# Patient Record
Sex: Male | Born: 1935 | Race: White | Hispanic: No | Marital: Married | Smoking: Former smoker
Health system: Southern US, Community
[De-identification: ages and names within clinical notes are randomized; demographics above are authoritative.]

## PROBLEM LIST (undated history)

## (undated) DIAGNOSIS — E785 Hyperlipidemia, unspecified: Secondary | ICD-10-CM

## (undated) DIAGNOSIS — E039 Hypothyroidism, unspecified: Secondary | ICD-10-CM

## (undated) DIAGNOSIS — Z9289 Personal history of other medical treatment: Secondary | ICD-10-CM

## (undated) DIAGNOSIS — M1990 Unspecified osteoarthritis, unspecified site: Secondary | ICD-10-CM

## (undated) DIAGNOSIS — J449 Chronic obstructive pulmonary disease, unspecified: Secondary | ICD-10-CM

## (undated) DIAGNOSIS — C4492 Squamous cell carcinoma of skin, unspecified: Secondary | ICD-10-CM

## (undated) DIAGNOSIS — Z8546 Personal history of malignant neoplasm of prostate: Secondary | ICD-10-CM

## (undated) DIAGNOSIS — L309 Dermatitis, unspecified: Secondary | ICD-10-CM

## (undated) DIAGNOSIS — R739 Hyperglycemia, unspecified: Secondary | ICD-10-CM

## (undated) DIAGNOSIS — D126 Benign neoplasm of colon, unspecified: Secondary | ICD-10-CM

## (undated) DIAGNOSIS — K219 Gastro-esophageal reflux disease without esophagitis: Secondary | ICD-10-CM

## (undated) DIAGNOSIS — C61 Malignant neoplasm of prostate: Secondary | ICD-10-CM

## (undated) DIAGNOSIS — I2510 Atherosclerotic heart disease of native coronary artery without angina pectoris: Secondary | ICD-10-CM

## (undated) DIAGNOSIS — Z87442 Personal history of urinary calculi: Secondary | ICD-10-CM

## (undated) DIAGNOSIS — D649 Anemia, unspecified: Secondary | ICD-10-CM

## (undated) DIAGNOSIS — G43909 Migraine, unspecified, not intractable, without status migrainosus: Secondary | ICD-10-CM

## (undated) DIAGNOSIS — K449 Diaphragmatic hernia without obstruction or gangrene: Secondary | ICD-10-CM

## (undated) DIAGNOSIS — K5730 Diverticulosis of large intestine without perforation or abscess without bleeding: Secondary | ICD-10-CM

## (undated) DIAGNOSIS — C3490 Malignant neoplasm of unspecified part of unspecified bronchus or lung: Secondary | ICD-10-CM

## (undated) HISTORY — DX: Personal history of other medical treatment: Z92.89

## (undated) HISTORY — PX: RETINAL DETACHMENT SURGERY: SHX105

## (undated) HISTORY — DX: Migraine, unspecified, not intractable, without status migrainosus: G43.909

## (undated) HISTORY — DX: Unspecified osteoarthritis, unspecified site: M19.90

## (undated) HISTORY — DX: Hyperglycemia, unspecified: R73.9

## (undated) HISTORY — PX: OTHER SURGICAL HISTORY: SHX169

## (undated) HISTORY — DX: Anemia, unspecified: D64.9

## (undated) HISTORY — DX: Benign neoplasm of colon, unspecified: D12.6

## (undated) HISTORY — PX: PROSTATECTOMY: SHX69

## (undated) HISTORY — PX: CORONARY STENT PLACEMENT: SHX1402

## (undated) HISTORY — DX: Malignant neoplasm of prostate: C61

## (undated) HISTORY — PX: TOTAL KNEE ARTHROPLASTY: SHX125

## (undated) HISTORY — DX: Hypothyroidism, unspecified: E03.9

## (undated) HISTORY — DX: Personal history of urinary calculi: Z87.442

## (undated) HISTORY — DX: Diaphragmatic hernia without obstruction or gangrene: K44.9

## (undated) HISTORY — PX: APPENDECTOMY: SHX54

## (undated) HISTORY — DX: Chronic obstructive pulmonary disease, unspecified: J44.9

## (undated) HISTORY — DX: Dermatitis, unspecified: L30.9

## (undated) HISTORY — DX: Gastro-esophageal reflux disease without esophagitis: K21.9

## (undated) HISTORY — DX: Diverticulosis of large intestine without perforation or abscess without bleeding: K57.30

## (undated) HISTORY — DX: Personal history of malignant neoplasm of prostate: Z85.46

## (undated) HISTORY — DX: Atherosclerotic heart disease of native coronary artery without angina pectoris: I25.10

## (undated) HISTORY — DX: Hyperlipidemia, unspecified: E78.5

---

## 1994-12-05 HISTORY — PX: LITHOTRIPSY: SUR834

## 1999-05-31 ENCOUNTER — Ambulatory Visit (HOSPITAL_BASED_OUTPATIENT_CLINIC_OR_DEPARTMENT_OTHER): Admission: RE | Admit: 1999-05-31 | Discharge: 1999-05-31 | Payer: Self-pay | Admitting: Orthopedic Surgery

## 2000-02-17 ENCOUNTER — Encounter: Payer: Self-pay | Admitting: Orthopedic Surgery

## 2000-02-23 ENCOUNTER — Encounter: Payer: Self-pay | Admitting: Orthopedic Surgery

## 2000-02-23 ENCOUNTER — Inpatient Hospital Stay (HOSPITAL_COMMUNITY): Admission: RE | Admit: 2000-02-23 | Discharge: 2000-02-25 | Payer: Self-pay | Admitting: Orthopedic Surgery

## 2000-02-25 ENCOUNTER — Inpatient Hospital Stay (HOSPITAL_COMMUNITY)
Admission: RE | Admit: 2000-02-25 | Discharge: 2000-03-02 | Payer: Self-pay | Admitting: Physical Medicine & Rehabilitation

## 2000-07-11 ENCOUNTER — Encounter (INDEPENDENT_AMBULATORY_CARE_PROVIDER_SITE_OTHER): Payer: Self-pay | Admitting: Specialist

## 2000-07-11 ENCOUNTER — Other Ambulatory Visit: Admission: RE | Admit: 2000-07-11 | Discharge: 2000-07-11 | Payer: Self-pay | Admitting: Gastroenterology

## 2000-12-29 ENCOUNTER — Inpatient Hospital Stay (HOSPITAL_COMMUNITY): Admission: RE | Admit: 2000-12-29 | Discharge: 2001-01-03 | Payer: Self-pay | Admitting: Orthopedic Surgery

## 2001-03-19 ENCOUNTER — Ambulatory Visit (HOSPITAL_BASED_OUTPATIENT_CLINIC_OR_DEPARTMENT_OTHER): Admission: RE | Admit: 2001-03-19 | Discharge: 2001-03-19 | Payer: Self-pay | Admitting: Orthopedic Surgery

## 2001-08-22 ENCOUNTER — Encounter: Admission: RE | Admit: 2001-08-22 | Discharge: 2001-08-22 | Payer: Self-pay | Admitting: *Deleted

## 2003-07-03 ENCOUNTER — Encounter: Payer: Self-pay | Admitting: *Deleted

## 2003-07-03 ENCOUNTER — Inpatient Hospital Stay (HOSPITAL_COMMUNITY): Admission: EM | Admit: 2003-07-03 | Discharge: 2003-07-04 | Payer: Self-pay | Admitting: *Deleted

## 2003-12-06 HISTORY — PX: COLONOSCOPY: SHX174

## 2004-04-08 ENCOUNTER — Encounter (INDEPENDENT_AMBULATORY_CARE_PROVIDER_SITE_OTHER): Payer: Self-pay | Admitting: Gastroenterology

## 2005-04-22 ENCOUNTER — Ambulatory Visit: Payer: Self-pay | Admitting: Cardiology

## 2005-04-27 ENCOUNTER — Ambulatory Visit: Payer: Self-pay

## 2005-05-18 ENCOUNTER — Ambulatory Visit: Payer: Self-pay | Admitting: Family Medicine

## 2005-05-25 ENCOUNTER — Encounter: Admission: RE | Admit: 2005-05-25 | Discharge: 2005-05-25 | Payer: Self-pay | Admitting: Family Medicine

## 2005-05-26 ENCOUNTER — Ambulatory Visit: Payer: Self-pay | Admitting: Family Medicine

## 2005-12-08 ENCOUNTER — Ambulatory Visit: Payer: Self-pay | Admitting: Cardiology

## 2005-12-08 ENCOUNTER — Inpatient Hospital Stay (HOSPITAL_COMMUNITY): Admission: EM | Admit: 2005-12-08 | Discharge: 2005-12-09 | Payer: Self-pay | Admitting: Emergency Medicine

## 2005-12-30 ENCOUNTER — Ambulatory Visit: Payer: Self-pay | Admitting: Cardiology

## 2006-01-10 ENCOUNTER — Encounter: Payer: Self-pay | Admitting: Internal Medicine

## 2006-01-10 ENCOUNTER — Ambulatory Visit: Payer: Self-pay

## 2006-04-26 ENCOUNTER — Ambulatory Visit: Payer: Self-pay | Admitting: Cardiology

## 2006-04-26 ENCOUNTER — Ambulatory Visit: Payer: Self-pay | Admitting: Gastroenterology

## 2006-11-24 ENCOUNTER — Ambulatory Visit: Payer: Self-pay | Admitting: Cardiology

## 2006-12-20 ENCOUNTER — Ambulatory Visit: Payer: Self-pay | Admitting: Cardiology

## 2007-03-08 ENCOUNTER — Ambulatory Visit: Payer: Self-pay | Admitting: Family Medicine

## 2007-06-20 DIAGNOSIS — I2510 Atherosclerotic heart disease of native coronary artery without angina pectoris: Secondary | ICD-10-CM

## 2007-06-20 DIAGNOSIS — Z8546 Personal history of malignant neoplasm of prostate: Secondary | ICD-10-CM | POA: Insufficient documentation

## 2007-06-20 DIAGNOSIS — K219 Gastro-esophageal reflux disease without esophagitis: Secondary | ICD-10-CM

## 2007-06-20 DIAGNOSIS — E785 Hyperlipidemia, unspecified: Secondary | ICD-10-CM

## 2007-06-26 ENCOUNTER — Ambulatory Visit: Payer: Self-pay | Admitting: Cardiology

## 2007-07-26 ENCOUNTER — Ambulatory Visit: Payer: Self-pay | Admitting: Cardiology

## 2007-09-27 ENCOUNTER — Encounter: Payer: Self-pay | Admitting: Family Medicine

## 2007-10-05 ENCOUNTER — Ambulatory Visit: Payer: Self-pay | Admitting: Gastroenterology

## 2007-10-22 ENCOUNTER — Telehealth: Payer: Self-pay | Admitting: Family Medicine

## 2007-11-06 ENCOUNTER — Ambulatory Visit: Payer: Self-pay | Admitting: Family Medicine

## 2007-11-06 DIAGNOSIS — M129 Arthropathy, unspecified: Secondary | ICD-10-CM | POA: Insufficient documentation

## 2007-11-06 DIAGNOSIS — L259 Unspecified contact dermatitis, unspecified cause: Secondary | ICD-10-CM

## 2007-11-07 ENCOUNTER — Ambulatory Visit: Payer: Self-pay | Admitting: Family Medicine

## 2007-11-09 ENCOUNTER — Encounter: Payer: Self-pay | Admitting: Family Medicine

## 2007-11-11 ENCOUNTER — Emergency Department (HOSPITAL_COMMUNITY): Admission: EM | Admit: 2007-11-11 | Discharge: 2007-11-11 | Payer: Self-pay | Admitting: Emergency Medicine

## 2007-11-13 ENCOUNTER — Telehealth: Payer: Self-pay | Admitting: Family Medicine

## 2007-11-30 LAB — CONVERTED CEMR LAB
BUN: 17 mg/dL (ref 6–23)
Basophils Absolute: 0 10*3/uL (ref 0.0–0.1)
Basophils Relative: 0.3 % (ref 0.0–1.0)
CO2: 32 meq/L (ref 19–32)
Calcium: 9.7 mg/dL (ref 8.4–10.5)
Cholesterol: 116 mg/dL (ref 0–200)
GFR calc Af Amer: 84 mL/min
HDL: 37.2 mg/dL — ABNORMAL LOW (ref >39.0)
Hemoglobin: 14.7 g/dL (ref 13.0–17.0)
LDL Cholesterol: 66 mg/dL (ref 0–99)
MCHC: 35.1 g/dL (ref 30.0–36.0)
Monocytes Absolute: 0.6 10*3/uL (ref 0.2–0.7)
Monocytes Relative: 11.2 % — ABNORMAL HIGH (ref 3.0–11.0)
Neutro Abs: 3.5 10*3/uL (ref 1.4–7.7)
Platelets: 169 10*3/uL (ref 150–400)
Potassium: 4.8 meq/L (ref 3.5–5.1)
RDW: 12.5 % (ref 11.5–14.6)
Triglycerides: 66 mg/dL (ref 0–149)
VLDL: 13 mg/dL (ref 0–40)

## 2007-12-03 LAB — CONVERTED CEMR LAB: Vit D, 1,25-Dihydroxy: 30 (ref 30–89)

## 2007-12-11 ENCOUNTER — Ambulatory Visit: Payer: Self-pay | Admitting: Cardiology

## 2008-01-24 ENCOUNTER — Ambulatory Visit: Payer: Self-pay | Admitting: Cardiology

## 2008-01-31 DIAGNOSIS — J449 Chronic obstructive pulmonary disease, unspecified: Secondary | ICD-10-CM

## 2008-01-31 DIAGNOSIS — Z87442 Personal history of urinary calculi: Secondary | ICD-10-CM

## 2008-01-31 DIAGNOSIS — J4489 Other specified chronic obstructive pulmonary disease: Secondary | ICD-10-CM | POA: Insufficient documentation

## 2008-01-31 DIAGNOSIS — K649 Unspecified hemorrhoids: Secondary | ICD-10-CM | POA: Insufficient documentation

## 2008-01-31 DIAGNOSIS — K449 Diaphragmatic hernia without obstruction or gangrene: Secondary | ICD-10-CM

## 2008-01-31 DIAGNOSIS — D126 Benign neoplasm of colon, unspecified: Secondary | ICD-10-CM | POA: Insufficient documentation

## 2008-01-31 DIAGNOSIS — K5730 Diverticulosis of large intestine without perforation or abscess without bleeding: Secondary | ICD-10-CM | POA: Insufficient documentation

## 2008-02-05 ENCOUNTER — Ambulatory Visit: Payer: Self-pay | Admitting: Family Medicine

## 2008-02-12 ENCOUNTER — Ambulatory Visit (HOSPITAL_BASED_OUTPATIENT_CLINIC_OR_DEPARTMENT_OTHER): Admission: RE | Admit: 2008-02-12 | Discharge: 2008-02-12 | Payer: Self-pay | Admitting: Orthopedic Surgery

## 2008-03-27 ENCOUNTER — Encounter: Admission: RE | Admit: 2008-03-27 | Discharge: 2008-03-27 | Payer: Self-pay | Admitting: Orthopedic Surgery

## 2008-04-01 ENCOUNTER — Ambulatory Visit: Payer: Self-pay | Admitting: Cardiology

## 2008-04-03 ENCOUNTER — Encounter: Payer: Self-pay | Admitting: Family Medicine

## 2008-04-07 ENCOUNTER — Telehealth: Payer: Self-pay | Admitting: Family Medicine

## 2008-04-16 ENCOUNTER — Ambulatory Visit: Payer: Self-pay | Admitting: Family Medicine

## 2008-04-18 ENCOUNTER — Encounter: Payer: Self-pay | Admitting: Family Medicine

## 2008-04-18 ENCOUNTER — Ambulatory Visit: Payer: Self-pay

## 2008-05-06 ENCOUNTER — Inpatient Hospital Stay (HOSPITAL_COMMUNITY): Admission: RE | Admit: 2008-05-06 | Discharge: 2008-05-09 | Payer: Self-pay | Admitting: Orthopedic Surgery

## 2008-05-28 ENCOUNTER — Emergency Department (HOSPITAL_COMMUNITY): Admission: EM | Admit: 2008-05-28 | Discharge: 2008-05-28 | Payer: Self-pay | Admitting: Emergency Medicine

## 2008-07-01 ENCOUNTER — Telehealth: Payer: Self-pay | Admitting: Family Medicine

## 2008-07-02 ENCOUNTER — Ambulatory Visit: Payer: Self-pay | Admitting: Family Medicine

## 2008-07-02 DIAGNOSIS — R5381 Other malaise: Secondary | ICD-10-CM | POA: Insufficient documentation

## 2008-07-02 DIAGNOSIS — Z96659 Presence of unspecified artificial knee joint: Secondary | ICD-10-CM

## 2008-07-02 DIAGNOSIS — R5383 Other fatigue: Secondary | ICD-10-CM

## 2008-07-02 DIAGNOSIS — E039 Hypothyroidism, unspecified: Secondary | ICD-10-CM | POA: Insufficient documentation

## 2008-07-02 DIAGNOSIS — D649 Anemia, unspecified: Secondary | ICD-10-CM

## 2008-07-07 LAB — CONVERTED CEMR LAB
BUN: 25 mg/dL — ABNORMAL HIGH (ref 6–23)
Basophils Relative: 0.4 % (ref 0.0–3.0)
CO2: 28 meq/L (ref 19–32)
Chloride: 103 meq/L (ref 96–112)
Creatinine, Ser: 0.9 mg/dL (ref 0.4–1.5)
Eosinophils Relative: 6.1 % — ABNORMAL HIGH (ref 0.0–5.0)
GFR calc non Af Amer: 87 mL/min
Glucose, Bld: 121 mg/dL — ABNORMAL HIGH (ref 70–99)
Lymphocytes Relative: 21.2 % (ref 12.0–46.0)
Monocytes Relative: 8.9 % (ref 3.0–12.0)
Neutrophils Relative %: 63.4 % (ref 43.0–77.0)
Platelets: 203 10*3/uL (ref 150–400)
Potassium: 3.9 meq/L (ref 3.5–5.1)
RBC: 4.23 M/uL (ref 4.22–5.81)
WBC: 5.6 10*3/uL (ref 4.5–10.5)

## 2008-07-23 ENCOUNTER — Ambulatory Visit: Payer: Self-pay | Admitting: Cardiology

## 2008-09-29 ENCOUNTER — Ambulatory Visit: Payer: Self-pay | Admitting: Family Medicine

## 2008-09-29 LAB — CONVERTED CEMR LAB: Hemoglobin: 14 g/dL

## 2008-10-01 ENCOUNTER — Ambulatory Visit: Payer: Self-pay | Admitting: Family Medicine

## 2008-10-09 ENCOUNTER — Encounter: Payer: Self-pay | Admitting: Family Medicine

## 2008-11-19 ENCOUNTER — Ambulatory Visit: Payer: Self-pay | Admitting: Cardiology

## 2009-02-19 ENCOUNTER — Telehealth: Payer: Self-pay | Admitting: Family Medicine

## 2009-02-19 ENCOUNTER — Ambulatory Visit: Payer: Self-pay | Admitting: Family Medicine

## 2009-03-03 ENCOUNTER — Telehealth: Payer: Self-pay | Admitting: Family Medicine

## 2009-03-16 ENCOUNTER — Encounter (INDEPENDENT_AMBULATORY_CARE_PROVIDER_SITE_OTHER): Payer: Self-pay | Admitting: *Deleted

## 2009-03-18 ENCOUNTER — Ambulatory Visit: Payer: Self-pay | Admitting: Cardiology

## 2009-04-16 ENCOUNTER — Ambulatory Visit: Payer: Self-pay | Admitting: Gastroenterology

## 2009-04-16 ENCOUNTER — Telehealth: Payer: Self-pay | Admitting: Gastroenterology

## 2009-05-18 ENCOUNTER — Encounter: Payer: Self-pay | Admitting: Gastroenterology

## 2009-05-22 DIAGNOSIS — M1990 Unspecified osteoarthritis, unspecified site: Secondary | ICD-10-CM | POA: Insufficient documentation

## 2009-06-01 ENCOUNTER — Ambulatory Visit: Payer: Self-pay | Admitting: Cardiology

## 2009-06-10 ENCOUNTER — Telehealth: Payer: Self-pay | Admitting: Cardiology

## 2009-06-12 LAB — CONVERTED CEMR LAB: Total CK: 77 units/L (ref 7–232)

## 2009-08-05 ENCOUNTER — Ambulatory Visit: Payer: Self-pay | Admitting: Cardiology

## 2009-10-01 ENCOUNTER — Ambulatory Visit: Payer: Self-pay | Admitting: Family Medicine

## 2009-10-06 ENCOUNTER — Ambulatory Visit: Payer: Self-pay | Admitting: Family Medicine

## 2009-10-06 DIAGNOSIS — L851 Acquired keratosis [keratoderma] palmaris et plantaris: Secondary | ICD-10-CM

## 2009-11-04 ENCOUNTER — Encounter: Payer: Self-pay | Admitting: Family Medicine

## 2009-11-06 ENCOUNTER — Encounter (INDEPENDENT_AMBULATORY_CARE_PROVIDER_SITE_OTHER): Payer: Self-pay | Admitting: *Deleted

## 2009-12-22 ENCOUNTER — Encounter (INDEPENDENT_AMBULATORY_CARE_PROVIDER_SITE_OTHER): Payer: Self-pay | Admitting: *Deleted

## 2009-12-23 ENCOUNTER — Ambulatory Visit: Payer: Self-pay | Admitting: Cardiology

## 2010-01-08 ENCOUNTER — Ambulatory Visit: Payer: Self-pay | Admitting: Gastroenterology

## 2010-01-19 ENCOUNTER — Telehealth: Payer: Self-pay | Admitting: Family Medicine

## 2010-01-20 ENCOUNTER — Ambulatory Visit: Payer: Self-pay | Admitting: Family Medicine

## 2010-02-12 ENCOUNTER — Encounter (INDEPENDENT_AMBULATORY_CARE_PROVIDER_SITE_OTHER): Payer: Self-pay | Admitting: *Deleted

## 2010-02-26 ENCOUNTER — Encounter (INDEPENDENT_AMBULATORY_CARE_PROVIDER_SITE_OTHER): Payer: Self-pay | Admitting: *Deleted

## 2010-03-01 ENCOUNTER — Ambulatory Visit: Payer: Self-pay | Admitting: Gastroenterology

## 2010-03-15 ENCOUNTER — Ambulatory Visit: Payer: Self-pay | Admitting: Gastroenterology

## 2010-03-17 ENCOUNTER — Ambulatory Visit: Payer: Self-pay | Admitting: Family Medicine

## 2010-03-17 ENCOUNTER — Encounter: Payer: Self-pay | Admitting: Gastroenterology

## 2010-03-17 DIAGNOSIS — D485 Neoplasm of uncertain behavior of skin: Secondary | ICD-10-CM

## 2010-03-17 DIAGNOSIS — R55 Syncope and collapse: Secondary | ICD-10-CM

## 2010-04-05 ENCOUNTER — Encounter: Payer: Self-pay | Admitting: Family Medicine

## 2010-05-04 ENCOUNTER — Telehealth: Payer: Self-pay | Admitting: Family Medicine

## 2010-05-11 ENCOUNTER — Ambulatory Visit: Payer: Self-pay | Admitting: Cardiology

## 2010-05-17 ENCOUNTER — Telehealth (INDEPENDENT_AMBULATORY_CARE_PROVIDER_SITE_OTHER): Payer: Self-pay | Admitting: *Deleted

## 2010-05-18 ENCOUNTER — Encounter: Payer: Self-pay | Admitting: Internal Medicine

## 2010-05-18 ENCOUNTER — Ambulatory Visit: Payer: Self-pay | Admitting: Cardiology

## 2010-05-18 ENCOUNTER — Ambulatory Visit: Payer: Self-pay | Admitting: Internal Medicine

## 2010-05-18 ENCOUNTER — Ambulatory Visit: Payer: Self-pay

## 2010-05-18 ENCOUNTER — Ambulatory Visit (HOSPITAL_COMMUNITY): Admission: RE | Admit: 2010-05-18 | Discharge: 2010-05-18 | Payer: Self-pay | Admitting: Cardiology

## 2010-05-18 LAB — CONVERTED CEMR LAB
AST: 19 units/L (ref 0–37)
Albumin: 3.8 g/dL (ref 3.5–5.2)
Alkaline Phosphatase: 66 units/L (ref 39–117)
Bilirubin, Direct: 0.1 mg/dL (ref 0.0–0.3)
Calcium: 9.2 mg/dL (ref 8.4–10.5)
GFR calc non Af Amer: 87.85 mL/min (ref >60)
Glucose, Bld: 101 mg/dL — ABNORMAL HIGH (ref 70–99)
HDL: 39.7 mg/dL (ref >39.00)
LDL Cholesterol: 109 mg/dL — ABNORMAL HIGH (ref 0–99)
Sodium: 143 meq/L (ref 135–145)
Total Bilirubin: 0.9 mg/dL (ref 0.3–1.2)
VLDL: 24.4 mg/dL (ref 0.0–40.0)

## 2010-05-20 ENCOUNTER — Telehealth: Payer: Self-pay | Admitting: Cardiology

## 2010-08-11 ENCOUNTER — Telehealth (INDEPENDENT_AMBULATORY_CARE_PROVIDER_SITE_OTHER): Payer: Self-pay | Admitting: *Deleted

## 2010-08-11 ENCOUNTER — Telehealth: Payer: Self-pay | Admitting: Family Medicine

## 2010-09-27 ENCOUNTER — Telehealth: Payer: Self-pay | Admitting: Family Medicine

## 2010-11-01 ENCOUNTER — Telehealth: Payer: Self-pay | Admitting: Cardiology

## 2010-11-04 ENCOUNTER — Ambulatory Visit: Payer: Self-pay | Admitting: Cardiology

## 2010-11-04 LAB — CONVERTED CEMR LAB
Albumin: 3.8 g/dL (ref 3.5–5.2)
Alkaline Phosphatase: 82 units/L (ref 39–117)
BUN: 21 mg/dL (ref 6–23)
Calcium: 9.1 mg/dL (ref 8.4–10.5)
Cholesterol: 172 mg/dL (ref 0–200)
GFR calc non Af Amer: 82.38 mL/min (ref >60)
Glucose, Bld: 105 mg/dL — ABNORMAL HIGH (ref 70–99)
HDL: 38.5 mg/dL — ABNORMAL LOW (ref >39.00)
LDL Cholesterol: 105 mg/dL — ABNORMAL HIGH (ref 0–99)
Sodium: 139 meq/L (ref 135–145)
Total Bilirubin: 0.8 mg/dL (ref 0.3–1.2)
VLDL: 28.2 mg/dL (ref 0.0–40.0)

## 2010-11-05 ENCOUNTER — Encounter: Payer: Self-pay | Admitting: Family Medicine

## 2010-12-21 ENCOUNTER — Ambulatory Visit: Admit: 2010-12-21 | Payer: Self-pay | Admitting: Cardiology

## 2011-01-04 ENCOUNTER — Telehealth: Payer: Self-pay | Admitting: Cardiology

## 2011-01-06 NOTE — Assessment & Plan Note (Signed)
Summary: post nasal drainage   Vital Signs:  Patient profile:   77 year old male Weight:      201.2 pounds O2 Sat:      97 % Temp:     98.1 degrees F Pulse rate:   80 / minute BP sitting:   134 / 76  (left arm) Cuff size:   regular  Vitals Entered By: Pura Spice, RN (March 17, 2010 11:37 AM) CC: wants referral to dermatologist    History of Present Illness: This 77 year old white male with history of prostate cancer he is in today wanting a violation of the lesion would put his been there for some time and is possibly a basal cell carcinoma Artery with the patient that we should refer him to a dermatologist which I will do  Allergies: 1)  ! Demerol  Past History:  Past Medical History: Last updated: 05/22/2009 CORONARY ARTERY DISEASE (ICD-414.00) HYPERLIPIDEMIA (ICD-272.4) COPD (ICD-496) COSTOCHONDRITIS (ICD-733.6) GERD (ICD-530.81) OSTEOARTHRITIS (ICD-715.90) ACUTE PHARYNGITIS (ICD-462) ACUTE BRONCHITIS (ICD-466.0) BACTERIAL  RHINITIS (ICD-460) ANEMIA (ICD-285.9) TOTAL KNEE REPLACEMENT, RIGHT, HX OF (ICD-V43.65) HYPOTHYROIDISM (ICD-244.9) FATIGUE (ICD-780.79) DM (ICD-250.00) HIATAL HERNIA (ICD-553.3) HEMORRHOIDS (ICD-455.6) RENAL CALCULUS, HX OF (ICD-V13.01) DIVERTICULOSIS OF COLON (ICD-562.10) ADENOMATOUS COLONIC POLYP (ICD-211.3) DERMATITIS (ICD-692.9) ARTHRITIS (ICD-716.90) PROSTATE CANCER, HX OF (ICD-V10.46) Migraines   Past Surgical History: Last updated: 06/20/2007 Knee Replacement-2001/2002 Lithotripsy-1996 Colonoscopy-2005 Prostatectomy  Social History: Last updated: 06/20/2007 Retired Former Smoker Drug use-no Regular exercise-no  Risk Factors: Smoking Status: quit (06/20/2007)  Review of Systems      See HPI  The patient denies anorexia, fever, weight loss, weight gain, vision loss, decreased hearing, hoarseness, chest pain, syncope, dyspnea on exertion, peripheral edema, prolonged cough, headaches, hemoptysis, abdominal pain,  melena, hematochezia, severe indigestion/heartburn, hematuria, incontinence, genital sores, muscle weakness, suspicious skin lesions, transient blindness, difficulty walking, depression, unusual weight change, abnormal bleeding, enlarged lymph nodes, angioedema, breast masses, and testicular masses.    Physical Exam  General:  Well-developed,well-nourished,in no acute distress; alert,appropriate and cooperative throughout examination Lungs:  Normal respiratory effort, chest expands symmetrically. Lungs are clear to auscultation, no crackles or wheezes. Heart:  Normal rate and regular rhythm. S1 and S2 normal without gallop, murmur, click, rub or other extra sounds. Skin:  Earvin Hansen. face which appears to be a possible basal cell carcinoma   Impression & Recommendations:  Problem # 1:  LESION, FACE (ICD-238.2) Assessment New  Orders: Dermatology Referral (Derma)  Problem # 2:  CORONARY ARTERY DISEASE (ICD-414.00) Assessment: Improved  His updated medication list for this problem includes:    Metoprolol Tartrate 25 Mg Tabs (Metoprolol tartrate) .Marland Kitchen... 1/2 tablet two times a day    Aspirin 81 Mg Tbec (Aspirin) .Marland Kitchen... 1 tab two times a day  Problem # 3:  GERD (ICD-530.81) Assessment: Improved  His updated medication list for this problem includes:    Prilosec Otc 20 Mg Tbec (Omeprazole magnesium) .Marland Kitchen... 1-2  by mouth once daily  Problem # 4:  TOTAL KNEE REPLACEMENT, RIGHT, HX OF (ICD-V43.65) Assessment: Improved  Complete Medication List: 1)  Caltrate 600+d Plus 600-400 Mg-unit Tabs (Calcium carbonate-vit d-min) .... Take one by mouth two times a day 2)  Prilosec Otc 20 Mg Tbec (Omeprazole magnesium) .Marland Kitchen.. 1-2  by mouth once daily 3)  Ocuvite Preservision Tabs (Multiple vitamins-minerals) .Marland Kitchen.. 1 tab two times a day 4)  Metoprolol Tartrate 25 Mg Tabs (Metoprolol tartrate) .... 1/2 tablet two times a day 5)  666 Cold 30-500 Mg Tabs (Pseudoephedrine-apap) .... One tab two times  a day 6)   Ketoconazole 2 % Sham (Ketoconazole) .... Shampoo 3x week 7)  Aspirin 81 Mg Tbec (Aspirin) .Marland Kitchen.. 1 tab two times a day 8)  Hydrocodone-acetaminophen 10-325 Mg Tabs (Hydrocodone-acetaminophen) .Marland Kitchen.. 1 by mouth every 4-6 hrs as needed pain. not to exceed 4 per day. 9)  Moviprep 100 Gm Solr (Peg-kcl-nacl-nasulf-na asc-c) .... As per prep instructions.  Patient Instructions: 1)  Referral to dermatologist to try to wait and treat lesion on the face

## 2011-01-06 NOTE — Progress Notes (Signed)
  Medications Added PRAVASTATIN SODIUM 20 MG TABS (PRAVASTATIN SODIUM) take 1 tablet at bedtime       Phone Note Outgoing Call   Call placed by: Lisabeth Devoid RN,  May 20, 2010 12:19 PM Call placed to: Patient Summary of Call: lab results   Follow-up for Phone Call        I spoke with Mr. [name] and he will consider taking Pravastatin per Dr. Vern Claude recommendation. He will call back to schedule lab work.  I have called in a prescription for Pravastatin 20mg  at bedtime for him. Lisabeth Devoid RN    New/Updated Medications: PRAVASTATIN SODIUM 20 MG TABS (PRAVASTATIN SODIUM) take 1 tablet at bedtime Prescriptions: PRAVASTATIN SODIUM 20 MG TABS (PRAVASTATIN SODIUM) take 1 tablet at bedtime  #30 x 6   Entered by:   Lisabeth Devoid RN   Authorized by:   Gaylord Shih, MD, [facility]   Signed by:   Lisabeth Devoid RN on 05/20/2010   Method used:   Electronically to        [facility] [phone]* (retail)       [address]       [place], [place]  [address]       Ph: [phone] or [phone]       Fax: [phone]   RxID:   [id number]   Appended Document:  good.

## 2011-01-06 NOTE — Letter (Signed)
Summary: [facility]  [facility]   Imported By: Maryln Gottron 11/10/2010 15:44:13  _____________________________________________________________________  External Attachment:    Type:   Image     Comment:   External Document

## 2011-01-06 NOTE — Miscellaneous (Signed)
Summary: [facility]  Clinical Lists Changes  Medications: Added new medication of MOVIPREP 100 GM  SOLR (PEG-KCL-NACL-NASULF-NA ASC-C) As per prep instructions. - Signed Rx of MOVIPREP 100 GM  SOLR (PEG-KCL-NACL-NASULF-NA ASC-C) As per prep instructions.;  #1 x 0;  Signed;  Entered by: Ezra Sites RN;  Authorized by: Rachael Fee MD;  Method used: [facility] [phone]*, [address] [place], [place]  [address], Ph: [phone] or [phone], Fax: [phone] Observations: Added new observation of ALLERGY REV: Done (03/01/2010 14:30)    Prescriptions: MOVIPREP 100 GM  SOLR (PEG-KCL-NACL-NASULF-NA ASC-C) As per prep instructions.  #1 x 0   Entered by:   Ezra Sites RN   Authorized by:   Rachael Fee MD   Signed by:   Ezra Sites RN on 03/01/2010   Method used:   Electronically to        [facility] [phone]* (retail)       [address]       [place], [place]  [address]       Ph: [phone] or [phone]       Fax: [phone]   RxID:   [id number]

## 2011-01-06 NOTE — Letter (Signed)
Summary: [facility] Instructions  [facility]  [address] [place], [place] [address]   Phone: [phone]  Fax: [phone]       DAEL HOWLAND    Apr 27, 1930    MRN: [id number]        Procedure Day /Date: Monday 03/15/2010     Arrival Time:  7:30 am      Procedure Time: 8:30 am     Location of Procedure:                    _x_  [facility] (4th Floor)                        PREPARATION FOR COLONOSCOPY WITH MOVIPREP   Starting 5 days prior to your procedure Wednesday 4/6 do not eat nuts, seeds, popcorn, corn, beans, peas,  salads, or any raw vegetables.  Do not take any fiber supplements (e.g. Metamucil, Citrucel, and Benefiber).  THE DAY BEFORE YOUR PROCEDURE         DATE: Sunday 4/10  1.  Drink clear liquids the entire day-NO SOLID FOOD  2.  Do not drink anything colored red or purple.  Avoid juices with pulp.  No orange juice.  3.  Drink at least 64 oz. (8 glasses) of fluid/clear liquids during the day to prevent dehydration and help the prep work efficiently.  CLEAR LIQUIDS INCLUDE: Water Jello Ice Popsicles Tea (sugar ok, no milk/cream) Powdered fruit flavored drinks Coffee (sugar ok, no milk/cream) Gatorade Juice: apple, white grape, white cranberry  Lemonade Clear bullion, consomm, broth Carbonated beverages (any kind) Strained chicken noodle soup Hard Candy                             4.  In the morning, mix first dose of MoviPrep solution:    Empty 1 Pouch A and 1 Pouch B into the disposable container    Add lukewarm drinking water to the top line of the container. Mix to dissolve    Refrigerate (mixed solution should be used within 24 hrs)  5.  Begin drinking the prep at 5:00 p.m. The MoviPrep container is divided by 4 marks.   Every 15 minutes drink the solution down to the next mark (approximately 8 oz) until the full liter is complete.   6.  Follow completed prep with 16 oz of clear liquid of your choice (Nothing red  or purple).  Continue to drink clear liquids until bedtime.  7.  Before going to bed, mix second dose of MoviPrep solution:    Empty 1 Pouch A and 1 Pouch B into the disposable container    Add lukewarm drinking water to the top line of the container. Mix to dissolve    Refrigerate  THE DAY OF YOUR PROCEDURE      DATE: Monday 4/11  Beginning at 3:30 a.m. (5 hours before procedure):         1. Every 15 minutes, drink the solution down to the next mark (approx 8 oz) until the full liter is complete.  2. Follow completed prep with 16 oz. of clear liquid of your choice.    3. You may drink clear liquids until 6:30 am (2 HOURS BEFORE PROCEDURE).   MEDICATION INSTRUCTIONS  Unless otherwise instructed, you should take regular prescription medications with a small sip of water   as early as possible the morning of your  procedure.           OTHER INSTRUCTIONS  You will need a responsible adult at least 75 years of age to accompany you and drive you home.   This person must remain in the waiting room during your procedure.  Wear loose fitting clothing that is easily removed.  Leave jewelry and other valuables at home.  However, you may wish to bring a book to read or  an iPod/MP3 player to listen to music as you wait for your procedure to start.  Remove all body piercing jewelry and leave at home.  Total time from sign-in until discharge is approximately 2-3 hours.  You should go home directly after your procedure and rest.  You can resume normal activities the  day after your procedure.  The day of your procedure you should not:   Drive   Make legal decisions   Operate machinery   Drink alcohol   Return to work  You will receive specific instructions about eating, activities and medications before you leave.    The above instructions have been reviewed and explained to me by   Ezra Sites RN  March 01, 2010 2:58 PM     I fully understand and can verbalize  these instructions _____________________________ Date _________

## 2011-01-06 NOTE — Progress Notes (Signed)
Summary: Hydrocodone refill  Phone Note Refill Request Message from:  Patient on August 11, 2010 10:10 AM  Refills Requested: Medication #1:  HYDROCODONE /APAP 10 MG/325MG  1/2 tab q five hours as needed   Dosage confirmed as above?Dosage Confirmed Please advise refill?  Initial call taken by: Josph Macho RMA,  August 11, 2010 10:10 AM  Follow-up for Phone Call        OK printed rx for 60 tabs and 1 rf Follow-up by: Danise Edge MD,  August 11, 2010 11:40 AM  Additional Follow-up for Phone Call Additional follow up Details #1::        pt informed and RX put in front cabinet Additional Follow-up by: Josph Macho RMA,  August 11, 2010 12:10 PM    Prescriptions: HYDROCODONE [place] [employer] 10 MG/325MG  1/2 tab q five hours as needed  #60 x 1   Entered and Authorized by:   Danise Edge MD   Signed by:   Danise Edge MD on 08/11/2010   Method used:   Print then Give to Patient   RxID:   [id number]

## 2011-01-06 NOTE — Letter (Signed)
Summary: New Patient letter  [facility]  [address] [place], [place] [address]   Phone: [phone]  Fax: [phone]       12/22/2009 MRN: [id number]  [name] [address] [address] [place], [place]  [address]  Dear [name],  Welcome to the Gastroenterology Division at [facility].    You are scheduled to see Dr.  Christella Hartigan on 01-08-10 at 3:30pm on the 3rd floor at [facility], [address]  We ask that you try to arrive at our office 15 minutes prior to your appointment time to allow for check-in.  We would like you to complete the enclosed self-administered evaluation form prior to your visit and bring it with you on the day of your appointment.  We will review it with you.  Also, please bring a complete list of all your medications or, if you prefer, bring the medication bottles and we will list them.  Please bring your insurance card so that we may make a copy of it.  If your insurance requires a referral to see a specialist, please bring your referral form from your primary care physician.  Co-payments are due at the time of your visit and may be paid by cash, check or credit card.     Your office visit will consist of a consult with your physician (includes a physical exam), any laboratory testing he/she may order, scheduling of any necessary diagnostic testing (e.g. x-ray, ultrasound, CT-scan), and scheduling of a procedure (e.g. Endoscopy, Colonoscopy) if required.  Please allow enough time on your schedule to allow for any/all of these possibilities.    If you cannot keep your appointment, please call [phone] to cancel or reschedule prior to your appointment date.  This allows [place] the opportunity to schedule an appointment for another patient in need of care.  If you do not cancel or reschedule by 5 p.m. the business day prior to your appointment date, you will be charged a $50.00 late cancellation/no-show fee.    Thank you for choosing [facility] for your medical needs.  We appreciate the opportunity to care for you.  Please visit [place] at our website  to learn more about our practice.                     Sincerely,                                                             The Gastroenterology Division

## 2011-01-06 NOTE — Letter (Signed)
Summary: Results Letter  [facility]  [address] [place], [place] [address]   Phone: [phone]  Fax: [phone]        March 17, 2010 MRN: [id number]    [name] [address] [place], [place]  [address]    Dear [name],   Good news.  The polyp(s) that were removed during your recent procedure were NOT pre-cancerous.  You would normally need a repeat colonoscopy in 5-10 years given your previous history of pre-cancerous polyps, however you will be 82-87 at that time and polyp surveillance is probably not of any clinical value at that point.   Please call if you have any questions or concerns.     Sincerely,  Rachael Fee MD  This letter has been electronically signed by your physician.  Appended Document: Results Letter letter mailed 04.13.11

## 2011-01-06 NOTE — Progress Notes (Signed)
Summary: returning call   Phone Note Call from Patient Call back at Home Phone [phone]   Caller: Patient Reason for Call: Talk to Nurse Summary of Call: returning call Initial call taken by: Migdalia Dk,  May 20, 2010 11:13 AM

## 2011-01-06 NOTE — Assessment & Plan Note (Signed)
  PROBLEM LIST: 1. Personal history of adenomatous polyps.  Family history of stomach cancer.  Last colonoscopy in May of 2005 with hyperplastic polyp and very tortuous in his left colon.  Next colonoscopy in May of 2010. 2. Chronic gastroesophageal reflux disease responsive to variety of proton pump inhibitors.  Status post EGD May of 2005 showing some mild esophagitis, a hiatal hernia.    History of Present Illness Visit Type: Follow-up Visit Primary GI MD: Rob Bunting MD Primary Provider: Judithann Sheen MD Chief Complaint: Discuss ECL History of Present Illness:     very pleasant 77 year old man whom I last saw about 2-3 years ago. He has had precancerous colon polyps removed by Dr. Victorino Dike. His last colonoscopy was in 2005 and Dr. Corinda Gubler documented a tortuous sigmoid colon which was difficult to pass. Patient was concerned about this and the possibility of perforation during another procedure. He really has had no bowel changes. No bleeding, no constipation.           Current Medications (verified): 1)  Caltrate 600+d Plus 600-400 Mg-Unit Tabs (Calcium Carbonate-Vit D-Min) .... Take One By Mouth Two Times A Day 2)  Prilosec Otc 20 Mg  Tbec (Omeprazole Magnesium) .Marland Kitchen.. 1-2  By Mouth Once Daily 3)  Ocuvite Preservision   Tabs (Multiple Vitamins-Minerals) .Marland Kitchen.. 1 Tab Two Times A Day 4)  Metoprolol Tartrate 25 Mg  Tabs (Metoprolol Tartrate) .... 1/2 Tablet Two Times A Day 5)  666 Cold 30-500 Mg Tabs (Pseudoephedrine-Apap) .... One Tab Two Times A Day 6)  Ketoconazole 2 % Sham (Ketoconazole) .... Shampoo 3x Week 7)  Aspirin 81 Mg Tbec (Aspirin) .Marland Kitchen.. 1 Tab Two Times A Day 8)  Hydrocodone-Acetaminophen 10-325 Mg Tabs (Hydrocodone-Acetaminophen) .Marland Kitchen.. 1 By Mouth Every 4-6 Hrs As Needed Pain. Not To Exceed 4 Per Day.  Allergies (verified): 1)  ! Demerol  Vital Signs:  Patient profile:   77 year old male Height:      71 inches Weight:      201.8 pounds BMI:      28.25 Pulse rate:   72 / minute Pulse rhythm:   regular BP sitting:   126 / 70  (left arm) Cuff size:   regular  Vitals Entered By: Harlow Mares CMA Duncan Dull) (January 08, 2010 3:29 PM)  Physical Exam  Additional Exam:  Constitutional: generally well appearing Psychiatric: alert and oriented times 3 Abdomen: soft, non-tender, non-distended, normal bowel sounds    Impression & Recommendations:  Problem # 1:  personal history of precancerous colon polyps he is due for surveillance colonoscopy around now and we will set that up at his soonest convenience. I did explain that there is always a risk of complications during a procedure such as perforation. I think it is unlikely that that will happen to him and we will be as careful as we always are.  Patient Instructions: 1)  You will be scheduled to have a colonoscopy. 2)  A copy of this information will be sent to Dr. Scotty Court. 3)  The medication list was reviewed and reconciled.  All changed / newly prescribed medications were explained.  A complete medication list was provided to the patient / caregiver.  Appended Document:  pt will call to schedule

## 2011-01-06 NOTE — Assessment & Plan Note (Signed)
Summary: injection  depo medrol 120 mg/per Gina/cjr   Nurse Visit   Allergies: 1)  ! Demerol  Medication Administration  Injection # 1:    Medication: Depo- Medrol 80mg     Diagnosis: ARTHRITIS (ICD-716.90)    Route: IM    Site: RUOQ gluteus    Exp Date: 08/2012    Lot #: [id number]    Mfr: Pharmacia    Comments: 120 MG GIVEN PER vo DR STAFFORD FOR ARTHRITIS    Patient tolerated injection without complications    Given by: Pura Spice, RN (January 20, 2010 3:07 PM)  Injection # 2:    Medication: Depo- Medrol 40mg     Diagnosis: ARTHRITIS (ICD-716.90)    Route: IM    Site: RUOQ gluteus    Exp Date: 08/2012    Lot #: [id number]    Mfr: Pharmacia    Patient tolerated injection without complications  Orders Added: 1)  Depo- Medrol 80mg  [J1040] 2)  Depo- Medrol 40mg  [J1030] 3)  Admin of Therapeutic Inj  intramuscular or subcutaneous [84132]

## 2011-01-06 NOTE — Procedures (Signed)
Summary: Colonoscopy  Patient: [name] Note: All result statuses are Final unless otherwise noted.  Tests: (1) Colonoscopy (COL)   COL Colonoscopy           DONE     [facility]     [address]     [place], [place]  [address]           COLONOSCOPY PROCEDURE REPORT     PATIENT:  [name], [name]  MR#:  [id number]     BIRTHDATE:  Dec 15, 1929, 79 yrs. old  GENDER:  male     ENDOSCOPIST:  Rachael Fee, MD           PROCEDURE DATE:  03/15/2010     PROCEDURE:  Colonoscopy with biopsy     ASA CLASS:  Class II     INDICATIONS:  history of pre-cancerous (adenomatous) colon polyps,     2001 TA removed by [facility], 2005 colonoscopy found only one HP polyp     MEDICATIONS:   Fentanyl 75 mcg IV, Versed 6 mg IV           DESCRIPTION OF PROCEDURE:   After the risks benefits and     alternatives of the procedure were thoroughly explained, informed     consent was obtained.  No rectal exam performed. The LB [id number]     [id number] endoscope was introduced through the anus and advanced to     the cecum, which was identified by both the appendix and ileocecal     valve, without limitations.  The quality of the prep was     excellent, using MoviPrep.  The instrument was then slowly     withdrawn as the colon was fully examined.     <<PROCEDUREIMAGES>>     FINDINGS:  A sessile polyp was found in the sigmoid colon. This     was 2mm across, removed with forceps, sent to pathology (jar 1)     (see image3).  Abnormal appearing mucosa. Sigmoid colon was     edematous, tortuous, slightly narrowed.  This was otherwise a     normal examination of the colon (see image1, image2, image3, and     image4).   Retroflexed views in the rectum revealed no     abnormalities.    The scope was then withdrawn from the patient     and the procedure completed.     COMPLICATIONS:  None     ENDOSCOPIC IMPRESSION:     1) Small sessile polyp in the sigmoid colon, removed and sent to     pathology     2)  Edematous, tortuous sigmoid colon (previously documented by     [facility] in 2005)     3) Otherwise normal examination           RECOMMENDATIONS:     1) If the polyp(s) removed today are proven to be adenomatous     (pre-cancerous) polyps, you would need a repeat colonoscopy in 5     years.  You will be 84 at that point and further colonoscopies may     not be clinically important     2) You will receive a letter within 1-2 weeks with the results     of your biopsy as well as final recommendations. Please call my     office if you have not received a letter after 3 weeks.           ______________________________     Melton Alar.  Christella Hartigan, MD           cc: Rickard Patience, MD           n.     Rosalie Doctor:   Rachael Fee at 03/15/2010 08:53 AM           [name], [id number]  Note: An exclamation mark (!) indicates a result that was not dispersed into the flowsheet. Document Creation Date: 03/15/2010 8:53 AM _______________________________________________________________________  (1) Order result status: Final Collection or observation date-time: 03/15/2010 08:45 Requested date-time:  Receipt date-time:  Reported date-time:  Referring Physician:   Ordering Physician: Rob Bunting [phone]) Specimen Source:  Source: Launa Grill Order Number: [id number] Lab site:   Appended Document: Colonoscopy reviewed

## 2011-01-06 NOTE — Consult Note (Signed)
Summary: [facility]  [facility]   Imported By: Maryln Gottron 08/04/2010 10:29:23  _____________________________________________________________________  External Attachment:    Type:   Image     Comment:   External Document

## 2011-01-06 NOTE — Progress Notes (Signed)
Summary: pt wants shot   Phone Note Call from Patient   Caller: Patient Summary of Call: pt called to say he got shot year ago and was told could get every 3 months wants to come in for shot  Initial call taken by: Pura Spice, RN,  January 19, 2010 11:05 AM  Follow-up for Phone Call        sch for injection  depo medrol 120 mg  Follow-up by: Pura Spice, RN,  January 19, 2010 1:29 PM  Additional Follow-up for Phone Call Additional follow up Details #1::        I called pt and sch for injection  depo medrol 120 mg, as noted above.  Additional Follow-up by: Lucy Antigua,  January 19, 2010 1:43 PM

## 2011-01-06 NOTE — Assessment & Plan Note (Signed)
Summary: yearly/sl  Medications Added ASPIRIN 81 MG TBEC (ASPIRIN) 1  tab two times a day * HYDROCODONE /APAP 10 MG/325MG  1/2 tab q five hours as needed * MAGNESIUM 600MG  1 tab once daily SUDAFED 30 MG TABS (PSEUDOEPHEDRINE HCL) 1 tab two times a day for allergies      Allergies Added:   Visit Type:  Follow-up Primary Provider:  Judithann Sheen MD  CC:  no complaints.  History of Present Illness: [name] comes in today for evaluation management coronary artery disease.  He is to overlapping stents, drug-eluting, and his LAD. He is good LV function. He was in a cholesterol study with the research foundation which was discontinued this past winter. I do not have follow up lipids and did not notice he needs a statin. Most likely he does.  He denies any angina or ischemic symptoms. He does have some mild dyspnea on exertion. He denies PND, orthopnea, or edema. He's had no syncope.  Clinical Reports Reviewed:  Cardiac Cath:  12/09/2005: Cardiac Cath Findings:  RAO ventriculography showed anterior apical Jolin Benavides  hypokinesis with an EF in the 40-45% range.   There is no gradient across aortic valve and no Derrick. Aortic pressure is  144/72, LV pressure was 140/16.   IMPRESSION:  The patient has not had chest pain up until yesterday evening.  I am not sure that the pain was cardiac. He has had a history of that bad  GERD and reflux.   The patient did appear to have an anterior apical Jade Burkard motion abnormality.  It may be worthwhile as an outpatient to follow up with an echo to assess  his LV function and add an ACE inhibitor. He has overlapping DES stents in  the LAD and it also may be worthwhile to reinstitute Plavix.   He tolerated the procedure well. For the time being we will continue his  aspirin, Lipitor or Nexium.  He will follow up with Dr. Samule Ohm as an  outpatient in 2-3 weeks. At that time, Dr. Samule Ohm can decide whether he  wants to reassess his LV function by echo and  consider adding an ACE  inhibitor or Plavix back.         ______________________________  [name], M.D.  07/03/2003: Cardiac Cath Findings:   IMPRESSION/RECOMMENDATIONS:  Successful stenting of the proximal and mid  left anterior descending using drug eluting stents.  Final angiograms  demonstrated no residual stenosis and TIMI 3 flow in the left anterior  descending.  There was a 70% stenosis in the jailed moderate sized diagonal  branch.  Plan is to manage this conservatively as the patient is without  chest discomfort or electrocardiographic abnormality.   Will plan medical management of his residual ramus and right coronary artery  disease with exercise tolerance test to be obtained to exclude significant  ischemia in the right coronary artery and diagonal territories.  This will  be obtained in approximately eight weeks.  Aspirin should be continued  indefinitely.  Plavix will be continued for one year given his unstable  presentation.  Eptifibatide will be continued for 18 hours.  Sheaths will be  removed when the ACT is less than 150 seconds.                                            Salvadore Farber, M.D. Cardiac Cath Findings:  Impression/Recommendations:Successful stenting of the proximal and mid left anterior descending using drug eluting stents. Final angiograms demonstrated no residual stenosis and TIMI 3 flow in the left anterior descending. There was 70% stenosis in the jailed moderate sized diagonal branch. Plan is to manage this conservatively as the patient is without chest discomfort or electrocardiographic abnormality. Will plan medical management of his residual ramus and right coronary artery disease with exercise tolerance test to be obtained to exclude significant ischemia in the right coronary artery and diagonal territories. This will be obtained in approximately eight weeks. Aspirin should be continued indefinitely. Plavix will be continued for one year given  hus unstable presentation. Eptifibatide will be continued for 18 hours. Sheaths will removed when ACT is less than 150 seconds.  Randa Evens, MD  Nuclear Study:  04/18/2008:  Excerise capacity:  Blood Pressure response: Normal blood pressure repsonse  Clinical symptoms: No chest pain or dyspnes  ECG impression: No significant ST segment change suggestive of ischemia  Overall impression: Normal stress nuclear study.  Dietrich Pates, MD   04/27/2005:  Final Interpretation: Normal stress Myoview with no chest pain, no electrocardiographic changes and the scintigraphic results show no evidence of ischemia or infarction in any vascular territory. The gated ejection fraction was 69% and the Kimora Stankovic motion was normal.  Olga Millers, MD, [facility]    Current Medications (verified): 1)  Caltrate 600+d Plus 600-400 Mg-Unit Tabs (Calcium Carbonate-Vit D-Min) .... Take One By Mouth Two Times A Day 2)  Prilosec Otc 20 Mg  Tbec (Omeprazole Magnesium) .Marland Kitchen.. 1-2  By Mouth Once Daily 3)  Ocuvite Preservision   Tabs (Multiple Vitamins-Minerals) .Marland Kitchen.. 1 Tab Two Times A Day 4)  Metoprolol Tartrate 25 Mg  Tabs (Metoprolol Tartrate) .... 1/2 Tablet Two Times A Day 5)  Ketoconazole 2 % Sham (Ketoconazole) .... Shampoo 3x Week 6)  Aspirin 81 Mg Tbec (Aspirin) .Marland Kitchen.. 1  Tab Two Times A Day 7)  Hydrocodone /apap 10 Mg/325mg  .... 1/2 Tab Q Five Hours As Needed 8)  Magnesium 600mg  .... 1 Tab Once Daily 9)  Sudafed 30 Mg Tabs (Pseudoephedrine Hcl) .Marland Kitchen.. 1 Tab Two Times A Day For Allergies  Allergies (verified): 1)  ! Demerol  Past History:  Past Medical History: Last updated: 05/22/2009 CORONARY ARTERY DISEASE (ICD-414.00) HYPERLIPIDEMIA (ICD-272.4) COPD (ICD-496) COSTOCHONDRITIS (ICD-733.6) GERD (ICD-530.81) OSTEOARTHRITIS (ICD-715.90) ACUTE PHARYNGITIS (ICD-462) ACUTE BRONCHITIS (ICD-466.0) BACTERIAL  RHINITIS (ICD-460) ANEMIA (ICD-285.9) TOTAL KNEE REPLACEMENT, RIGHT, HX OF (ICD-V43.65) HYPOTHYROIDISM  (ICD-244.9) FATIGUE (ICD-780.79) DM (ICD-250.00) HIATAL HERNIA (ICD-553.3) HEMORRHOIDS (ICD-455.6) RENAL CALCULUS, HX OF (ICD-V13.01) DIVERTICULOSIS OF COLON (ICD-562.10) ADENOMATOUS COLONIC POLYP (ICD-211.3) DERMATITIS (ICD-692.9) ARTHRITIS (ICD-716.90) PROSTATE CANCER, HX OF (ICD-V10.46) Migraines   Past Surgical History: Last updated: 06/20/2007 Knee Replacement-2001/2002 Lithotripsy-1996 Colonoscopy-2005 Prostatectomy  Family History: Last updated: 06/20/2007 Family History of Arthritis Family History Hypertension Family History Other cancer  Social History: Last updated: 06/20/2007 Retired Former Smoker Drug use-no Regular exercise-no  Risk Factors: Exercise: no (06/20/2007)  Risk Factors: Smoking Status: quit (06/20/2007)  Review of Systems       negative other than history of present illness  Vital Signs:  Patient profile:   77 year old male Height:      71 inches Weight:      199 pounds Pulse rate:   65 / minute BP sitting:   134 / 85  (left arm)  Vitals Entered By: Burnett Kanaris, CNA (May 11, 2010 3:32 PM)  Physical Exam  General:  Well developed, well nourished, in no acute distress.  Head:  normocephalic and atraumatic Eyes:  PERRLA/EOM intact; conjunctiva and lids normal. Neck:  Neck supple, no JVD. No masses, thyromegaly or abnormal cervical nodes. Chest Virlee Stroschein:  no deformities or breast masses noted Lungs:  Clear bilaterally to auscultation and percussion. Heart:  Non-displaced PMI, chest non-tender; regular rate and rhythm, S1, S2 without murmurs, rubs or gallops. Carotid upstroke normal, no bruit. Normal abdominal aortic size, no bruits. Femorals normal pulses, no bruits. Pedals normal pulses. No edema, no varicosities. Msk:  Back normal, normal gait. Muscle strength and tone normal. Pulses:  pulses normal in all 4 extremities Extremities:  No clubbing or cyanosis. Neurologic:  Alert and oriented x 3. Skin:  Intact without lesions or  rashes. Psych:  Normal affect.   EKG  Procedure date:  05/11/2010  Findings:      normal sinus rhythm, normal EKG  Impression & Recommendations:  Problem # 1:  CORONARY ARTERY DISEASE (ICD-414.00)  It has been 2 years since his last objective assessment of his coronary disease. We'll arrange stress Myoview. Also obtain fasting lipids and electrolytes and LFTs to see if he needs a statin. His updated medication list for this problem includes:    Metoprolol Tartrate 25 Mg Tabs (Metoprolol tartrate) .Marland Kitchen... 1/2 tablet two times a day    Aspirin 81 Mg Tbec (Aspirin) .Marland Kitchen... 1  tab two times a day  Orders: EKG w/ Interpretation (93000) Nuclear Stress Test (Nuc Stress Test)  Problem # 2:  HYPERLIPIDEMIA (ICD-272.4)  Patient Instructions: 1)  Your physician recommends that you schedule a follow-up appointment in: 1 year with Dr. Daleen Squibb 2)  Your physician recommends that you continue on your current medications as directed. Please refer to the Current Medication list given to you today. 3)  Your physician has requested that you have an lexiscan myoview.  For further information please visit [email or web address]  Please follow instruction sheet, as given. 4)  Your physician recommends that you return for lab work same day as lexiscan:  fasting lipids, bmet, liver  414.00  Appended Document: yearly/sl reviewed

## 2011-01-06 NOTE — Progress Notes (Signed)
Summary: Nuclear Pre-Procedure  Phone Note Outgoing Call   Call placed by: T.Wall Summary of Call: Reviewed information on Myoview Information Sheet (see scanned document for further details).  Spoke with patient.     Nuclear Med Background Indications for Stress Test: Evaluation for Ischemia   History: COPD, Heart Catheterization, Myocardial Perfusion Study, Stents  History Comments: '04 STENTS x2 LAD '07 Heart Cath EF 40-45% '09 MPS NL EF 69%  Symptoms: DOE    Nuclear Pre-Procedure Cardiac Risk Factors: History of Smoking, Lipids Height (in): 71  Nuclear Med Study Referring MD:  T.Wall

## 2011-01-06 NOTE — Letter (Signed)
Summary: [facility]  [facility]   Imported By: Maryln Gottron 12/10/2009 11:11:43  _____________________________________________________________________  External Attachment:    Type:   Image     Comment:   External Document

## 2011-01-06 NOTE — Assessment & Plan Note (Signed)
Summary: Cardiology Nuclear Study  Nuclear Med Background Indications for Stress Test: Evaluation for Ischemia   History: COPD, Heart Catheterization, Myocardial Perfusion Study, Stents  History Comments: '04 STENTS x2 LAD '07 Heart Cath EF 40-45% '09 MPS NL EF 69%  Symptoms: DOE, Palpitations    Nuclear Pre-Procedure Cardiac Risk Factors: History of Smoking, Lipids Caffeine/Decaff Intake: none NPO After: 8:00 PM Lungs: clear IV 0.9% NS with Angio Cath: 20g     IV Site: (R) AC IV Started by: Stanton Kidney EMT-P Chest Size (in) 46     Height (in): 71 Weight (lb): 199 BMI: 27.86 Tech Comments: Held Metoprolol this AM, per Patient.  Nuclear Med Study 1 or 2 day study:  1 day     Stress Test Type:  Eugenie Birks Reading MD:  Arvilla Meres, MD     Referring MD:  T.Wall Resting Radionuclide:  Technetium 23m Tetrofosmin     Resting Radionuclide Dose:  11.0 mCi  Stress Radionuclide:  Technetium 67m Tetrofosmin     Stress Radionuclide Dose:  32.0 mCi   Stress Protocol   Lexiscan: 0.4 mg   Stress Test Technologist:  Milana Na EMT-P     Nuclear Technologist:  Domenic Polite CNMT  Rest Procedure  Myocardial perfusion imaging was performed at rest 45 minutes following the intravenous administration of Myoview Technetium 16m Tetrofosmin.  Stress Procedure  The patient received IV Lexiscan 0.4 mg over 15-seconds.  Myoview injected at 30-seconds.  There were no significant changes and occ pvcs with infusion.  Quantitative spect images were obtained after a 45 minute delay.  QPS Raw Data Images:  Normal; no motion artifact; normal heart/lung ratio. Stress Images:  Thinning in the inferior wall anddecreased uptake in the inferoapex Rest Images:  Thinning in the inferior wall Subtraction (SDS):  Fixed thinning of the inferior wall suggestive of previous inferior infarct with very mild infero-apical ischemia. Transient Ischemic Dilatation:  1.15  (Normal <1.22)  Lung/Heart Ratio:   .25  (Normal <0.45)  Quantitative Gated Spect Images QGS EDV:  88 ml QGS ESV:  31 ml QGS EF:  64 % QGS cine images:  Normal  Findings Low risk nuclear study      Overall Impression  Exercise Capacity: Lexsiscan study with no exercise. ECG Impression: Baseline: NSR; No significant ST segment change with Lexiscan. Overall Impression: Low risk stress nuclear study. Overall Impression Comments: Fixed thinning of the inferior wall suggestive of previous inferior infarct with very mild infero-apical ischemia.  Appended Document: Cardiology Nuclear Study low risk, good LV function. Stable, no change in treatment.   Appended Document: Cardiology Nuclear Study lmtcb./cy  Appended Document: Cardiology Nuclear Study LMTCB./CY  Appended Document: Cardiology Nuclear Study PT AWARE./CY

## 2011-01-06 NOTE — Progress Notes (Signed)
Summary: Hydrocodone refill  Phone Note Refill Request Message from:  Fax from Pharmacy on September 27, 2010 1:43 PM  Refills Requested: Medication #1:  HYDROCODONE /APAP 10 MG/325MG  1/2 tab q five hours as needed   Dosage confirmed as above?Dosage Confirmed Please advise refill?  Initial call taken by: Josph Macho RMA,  September 27, 2010 1:43 PM  Follow-up for Phone Call        OK to give with same sig and same #, no further refills Follow-up by: Danise Edge MD,  September 27, 2010 1:56 PM    Prescriptions: HYDROCODONE [place] [employer] 10 MG/325MG  1/2 tab q five hours as needed  #60 x 0   Entered by:   Josph Macho RMA   Authorized by:   Danise Edge MD   Signed by:   Josph Macho RMA on 09/27/2010   Method used:   Telephoned to ...       [facility] [address] (retail)       [address]       [place], [place]  [address]       Ph: [phone] or [phone]       Fax: [phone]   RxID:   [id number]

## 2011-01-06 NOTE — Progress Notes (Signed)
Summary: refill questions  Phone Note Call from Patient   Caller: Patient Call For: Judithann Sheen MD Summary of Call: Pt is calling regarding he and his wife and their meds......Marland KitchenSays the pharmacist says we have not refilled their requested meds???? [phone]  Left message to call back with more information???  What meds, what pharmacist? Initial call taken by: Lynann Beaver CMA,  August 11, 2010 10:04 AM  Follow-up for Phone Call        Please see above note., Follow-up by: Josph Macho RMA,  August 11, 2010 2:43 PM

## 2011-01-06 NOTE — Letter (Signed)
Summary: Previsit letter  [facility]  [address] [place], [place] [address]   Phone: [phone]  Fax: [phone]       02/12/2010 MRN: [id number]  [name] [address] [place], [place]  [address]  Dear [name],  Welcome to the Gastroenterology Division at [facility].    You are scheduled to see a nurse for your pre-procedure visit on 03/01/2010 at 2:00pm on the 3rd floor at [facility], [address]  We ask that you try to arrive at our office 15 minutes prior to your appointment time to allow for check-in.  Your nurse visit will consist of discussing your medical and surgical history, your immediate family medical history, and your medications.    Please bring a complete list of all your medications or, if you prefer, bring the medication bottles and we will list them.  We will need to be aware of both prescribed and over the counter drugs.  We will need to know exact dosage information as well.  If you are on blood thinners (Coumadin, Plavix, Aggrenox, Ticlid, etc.) please call our office today/prior to your appointment, as we need to consult with your physician about holding your medication.   Please be prepared to read and sign documents such as consent forms, a financial agreement, and acknowledgement forms.  If necessary, and with your consent, a friend or relative is welcome to sit-in on the nurse visit with you.  Please bring your insurance card so that we may make a copy of it.  If your insurance requires a referral to see a specialist, please bring your referral form from your primary care physician.  No co-pay is required for this nurse visit.     If you cannot keep your appointment, please call [phone] to cancel or reschedule prior to your appointment date.  This allows [place] the opportunity to schedule an appointment for another patient in need of care.    Thank you for choosing [facility] for your medical  needs.  We appreciate the opportunity to care for you.  Please visit [place] at our website  to learn more about our practice.                     Sincerely.                                                                                                                   The Gastroenterology Division

## 2011-01-06 NOTE — Progress Notes (Signed)
Summary: REFILL REQUEST  Phone Note Refill Request Message from:  Fax from Pharmacy on May 04, 2010 12:48 PM  Refills Requested: Medication #1:  HYDROCODONE-ACETAMINOPHEN 10-325 MG TABS 1 by mouth every 4-6 hrs as needed pain. not to exceed 4 per day..   [facility] [address]    Initial call taken by: Debbra Riding,  May 04, 2010 12:48 PM  Follow-up for Phone Call        ok x 5 refills per dr Scotty Court  Follow-up by: Pura Spice, RN,  May 04, 2010 1:03 PM    New/Updated Medications: HYDROCODONE-ACETAMINOPHEN 10-325 MG TABS (HYDROCODONE-ACETAMINOPHEN) 1 by mouth every 4-6 hrs as needed pain. not to exceed 4 per day. Prescriptions: HYDROCODONE-ACETAMINOPHEN 10-325 MG TABS (HYDROCODONE-ACETAMINOPHEN) 1 by mouth every 4-6 hrs as needed pain. not to exceed 4 per day.  #120 x 5   Entered by:   Pura Spice, RN   Authorized by:   Judithann Sheen MD   Signed by:   Pura Spice, RN on 05/04/2010   Method used:   Telephoned to ...       [facility] [address] (retail)       [address]       [place], [place]  [address]       Ph: [phone] or [phone]       Fax: [phone]   RxID:   [id number]

## 2011-01-06 NOTE — Progress Notes (Signed)
Summary: order for lab work   Phone Note Call from Patient   Caller: Patient [phone] Reason for Call: Talk to Nurse Summary of Call: pt wants to know if he needs lab work? Initial call taken by: Glynda Jaeger,  November 01, 2010 11:53 AM  Follow-up for Phone Call        Mr. Boyd will come in 11/04/10 for fasting lipids, liver, bmp. Since starting pravastatin. Mylo Red RN     Appended Document: order for lab work  Reviewed Juanito Doom, MD

## 2011-01-07 ENCOUNTER — Other Ambulatory Visit (INDEPENDENT_AMBULATORY_CARE_PROVIDER_SITE_OTHER): Payer: Medicare Other

## 2011-01-07 ENCOUNTER — Other Ambulatory Visit: Payer: Self-pay | Admitting: Cardiology

## 2011-01-07 ENCOUNTER — Encounter: Payer: Self-pay | Admitting: Cardiology

## 2011-01-07 DIAGNOSIS — E785 Hyperlipidemia, unspecified: Secondary | ICD-10-CM

## 2011-01-07 LAB — HEPATIC FUNCTION PANEL
ALT: 14 U/L (ref 0–53)
AST: 18 U/L (ref 0–37)
Alkaline Phosphatase: 71 U/L (ref 39–117)
Bilirubin, Direct: 0.1 mg/dL (ref 0.0–0.3)
Total Bilirubin: 0.6 mg/dL (ref 0.3–1.2)

## 2011-01-07 LAB — BASIC METABOLIC PANEL
BUN: 19 mg/dL (ref 6–23)
Calcium: 9.1 mg/dL (ref 8.4–10.5)
Creatinine, Ser: 1 mg/dL (ref 0.4–1.5)
GFR: 74.1 mL/min (ref >60.00)
Glucose, Bld: 93 mg/dL (ref 70–99)

## 2011-01-12 NOTE — Progress Notes (Signed)
Summary: set up for lab test   Phone Note Call from Patient Call back at Home Phone [phone]   Reason for Call: Talk to Nurse, Lab or Test Results Details for Reason: pt would like to be set up for a lab test.  Initial call taken by: Lorne Skeens,  January 04, 2011 9:35 AM  Follow-up for Phone Call        I talked with pt and he will return for lab work on 02/01/11. fasting lipid, liver, and bmet 272.4. Mylo Red RN

## 2011-03-10 ENCOUNTER — Other Ambulatory Visit: Payer: Self-pay | Admitting: Gastroenterology

## 2011-04-19 NOTE — Assessment & Plan Note (Signed)
[facility]                            CARDIOLOGY OFFICE NOTE   EDDRICK, DILONE                       MRN:          [id number]  DATE:12/11/2007                            DOB:          1930-07-28    Mr. Gatta returns today for further management of his coronary disease.   1. He currently is having no angina or ischemic symptoms.  He had a      catheterization in July of 2007 for chest pain.  He had      nonobstructive disease, at that time.  His stents were patent.  2. Normal left ventricular function.  3. Hyperlipidemia.  He is still in the Improvement study.   He offers no complaints, today, except that he may have to have repeat  right knee surgery.  He had a lot of trouble getting around.   His current meds are Improvement study:  1. Metoprolol 12.5 mg p.o. b.i.d.  2. Enteric-coated aspirin 81 mg a day.  3. Prilosec over-the-counter.  4. Vitamin D every week.  5. Sudafed 1 p.o. b.i.d.  6. Ocuvite.  7. Hydrocodone.   PHYSICAL EXAMINATION:  His blood pressure is 118/80.  His pulse is 70  and regular.  His weight is 200.  HEENT:  Normocephalic, atraumatic.  PERRL.  Extraocular movements  intact.  Sclerae are clear.  Facial symmetry is normal.  NECK:  Supple.  Carotid upstrokes were equal bilaterally without bruits.  There is no JVD.  Thyroid is not enlarged.  Trachea is midline.  LUNGS:  Clear.  HEART:  Reveals a nondisplaced PMI.  There is normal S1-S2 without  gallop.  ABDOMINAL EXAM:  Soft, good bowel sounds.  EXTREMITIES:  Reveal no edema.  Pulses are intact.  NEUROLOGIC EXAM:  Intact.   EKG:  Essentially normal.   ASSESSMENT/PLAN:  Mr. Tory is doing well.  We will arrange for him to  have an adenosine rest stress Myoview in July.  If he decided to have  knee surgery before that, he needs clearance from [place].     Thomas C. Daleen Squibb, MD, [facility]  Electronically Signed    TCW/MedQ  DD: 12/11/2007  DT: 12/11/2007  Job #: [id number]   cc:   Ellin Saba., MD

## 2011-04-19 NOTE — Assessment & Plan Note (Signed)
[facility]                            CARDIOLOGY OFFICE NOTE   [name], [name]                       MRN:          [id number]  DATE:11/19/2008                            DOB:          07-19-1930    [name] comes in today for followup.  Since I saw him he had right  knee surgery back in the summer.  We cleared him with a preop adenosine  Myoview.  Myoview dated Apr 18, 2008, EF 68% with no ischemia or scar.   He is currently having no anterior ischemic symptoms.  He remains on  IMPROVE-IT study.  He is due blood work with that study.   CURRENT MEDICATIONS:  1. Calcium and vitamin D t.i.d.  2. Lopressor 12.5 b.i.d.  3. Hydrocodone one-half b.i.d.  4. Ocuvite 2 daily.  5. Prilosec 20 mg a day.  6. Enteric-coated aspirin 325 mg a day.   PHYSICAL EXAMINATION:  VITAL SIGNS:  Blood pressure is 136/82, his pulse  is 72 and regular, his weight is 201.  HEENT:  Normal.  Carotid upstrokes were equal bilaterally without  bruits, no JVD.  Thyroid is not enlarged.  Trachea is midline.  LUNGS:  Clear to auscultation and percussion.  HEART:  Nondisplaced PMI, soft S1 and S2.  ABDOMINAL:  Soft, good bowel sounds.  No midline bruit.  No  hepatomegaly.  EXTREMITIES:  No cyanosis, clubbing, or edema.  Pulses are intact.   ASSESSMENT AND PLAN:  [name] is doing well.  We have made no changes  in his medical program.  He is due blood work which we will arrange  through the [employer].  I will plan on seeing him back in 6  months.     Thomas C. Daleen Squibb, MD, [facility]  Electronically Signed    TCW/MedQ  DD: 11/19/2008  DT: 11/19/2008  Job #: [id number]

## 2011-04-19 NOTE — Assessment & Plan Note (Signed)
[facility]                         GASTROENTEROLOGY OFFICE NOTE   MAHMOUD, BLAZEJEWSKI                       MRN:          [id number]  DATE:10/05/2007                            DOB:          [birth year]    PROBLEM LIST:  1. Personal history of adenomatous polyps.  Family history of colon      cancer.  Last colonoscopy in May of 2005 with hyperplastic polyp      and very tortuous in his left colon.  Next colonoscopy in May of      2010.  2. Chronic gastroesophageal reflux disease responsive to variety of      proton pump inhibitors.  Status post EGD May of 2005 showing some      mild esophagitis, a hiatal hernia.   INTERVAL HISTORY:  The patient was last seen here by Ulyess Mort,  M.D. 2-3 years ago.  He has GERD and a history of polyps summarized  above.  He has recently switched off of Nexium for insurance company  reasons and is taking OTC Prilosec which he says works very well, at  least as well as the Nexium or perhaps even better.  He has no  dysphagia, no vomiting, no bleeding.   CURRENT MEDICATIONS:  1. Metoprolol.  2. Hydrocodone.  3. Ocuvite.  4. Sudafed.  5. Nexium.  6. Calcium.  7. OTC Prilosec once daily.   PHYSICAL EXAMINATION:  VITAL SIGNS:  Weight 204 pounds which is up 8  pounds since July of 2008.  Blood pressure 114/82, pulse 66.  CONSTITUTIONAL:  Generally well-appearing.  NEUROLOGY:  Alert and oriented x3.  Eyes; extraocular movements intact.  ABDOMEN:  Soft, nontender, and nondistended.  Normal bowel sounds.  HEART:  Regular rate and rhythm.   ASSESSMENT:  A 77 year old man with personal history of colon polyps,  chronic GERD.   He has no alarm symptoms. He has been gaining weight.  I think staying  on OCT Prilosec since it is working for his symptoms is completely  reasonable.  He will be put in the reminder system for a colonoscopy in  May of 2010.  He knows to contact me if he has any further troubles.    Rachael Fee, MD  Electronically Signed   DPJ/MedQ  DD: 10/05/2007  DT: 10/06/2007  Job #: [id number]   cc:   Ellin Saba., MD

## 2011-04-19 NOTE — Op Note (Signed)
[name], [name]                ACCOUNT NO.:  [id number]   MEDICAL RECORD NO.:  [id number]          PATIENT TYPE:  AMB   LOCATION:  DSC                          FACILITY:  [facility]   PHYSICIAN:  Katy Fitch. Sypher, M.D. DATE OF BIRTH:  11/04/1930   DATE OF PROCEDURE:  02/12/2008  DATE OF DISCHARGE:                               OPERATIVE REPORT   PREOPERATIVE DIAGNOSIS:  Complex blender laceration left index finger  pulp, radial nail wall, nail and nail matrix.   POSTOPERATIVE DIAGNOSIS:  Complex blender laceration left index finger  pulp, radial nail wall, nail and nail matrix.   OPERATION:  1. Irrigation and debridement of open fracture of left index finger      distal phalanx and untidy pulp and nail fold lacerations.  2. Repair of pulp lacerations with 5-0 nylon.  3. Nail plate removal and repair of three nail lacerations utilizing 6-      0 chromic suture with replacement of nail plate.   SURGEON:  Katy Fitch. Sypher, M.D.   ASSISTANT:  Marveen Reeks. Dasnoit, P.A.-C.   ANESTHESIA:  2% lidocaine metacarpal head level block of left index  finger.  This was performed as a minor operating room procedure in room  6 at the [facility].   INDICATIONS:  [name] is a 77 year old gentleman referred through  the courtesy of Dr. Louanna Raw a [facility] for evaluation and  management of a blender finger laceration injury.  [name] is quite  familiar with our practice as we have helped his daughter with  reconstructive rheumatoid arthritis surgery in the past.  Earlier today,  he accidentally activated a blender with his finger within the mechanism  sustaining multiple deep lacerations of the radial aspect of his left  index finger pulp, radial nail wall, and nail.  He was seen at [facility] where his wound was noted to be relatively complicated.  He had a  digital x-ray obtained which revealed an open fracture of the distal  phalangeal radial tuft. He was dressed and  referred for hand surgery  consult.   On exam at this time, he is noted to have multiple lacerations that are  untidy and bleeding.  His tetanus immunization was up-to-date.  His  general medical condition was stable.  After informed consent, he is  brought to the minor operating room for irrigation and debridement of  his wound followed by repair of the pulp, nail wall, and nail.   PROCEDURE:  [name] is brought to the operating room and placed in  the supine position on the operating table.  Following Betadine prep, a  2% lidocaine metacarpal head level block was placed without  complication.  After 5 minutes, excellent anesthesia of the left index  finger was achieved.  The left arm was then prepped with Betadine soap  solution and sterilely draped.  The left index finger was exsanguinated  with a gauze wrap and a 1/2 inch Penrose drain placed over the base of  the proximal phalanx as a digital tourniquet.   The procedure commenced with thorough  irrigation of the wounds with  sterile saline and use of a microcurette to curette the distal  phalangeal open fracture.  The nail was elevated with a Therapist, nutritional  revealing three lacerations through the nail matrix.  The pulp was  repaired with multiple interrupted sutures of 5-0 nylon reassembling the  diced fingertip anatomically followed by repair of the nail with  multiple interrupted sutures of 6-0 chromic.  The nail plate was  replaced anatomically.  The finger was then dressed with Xeroflow,  sterile gauze, and a loose Coban dressing.   We have advised [name] to keep his finger elevated for the next 48  hours above his heart.  He is provided prescriptions for Keflex 500 mg 1  p.o. q.8h. x4 days as a prophylactic antibiotic and Vicodin 5 mg 1 p.o.  q.4-6h. p.r.n. pain, 20 tablets without refill.  We asked him to return  to our office for follow up on Monday, February 18, 2008, for dressing  change.  We will remove sutures at  approximately 8-9 days post injury.      Katy Fitch Sypher, M.D.  Electronically Signed     RVS/MEDQ  D:  02/12/2008  T:  02/13/2008  Job:  [id number]   cc:   Louanna Raw

## 2011-04-19 NOTE — Assessment & Plan Note (Signed)
[facility]                            CARDIOLOGY OFFICE NOTE   GIAN, YBARRA                       MRN:          [id number]  DATE:06/26/2007                            DOB:          Nov 28, 1930    Mr. [name] returns today for further management of coronary artery  disease.   He feels remarkably well with his task oriented life.   He is having no angina or ischemic symptoms.   1. The last objective assessment of his coronary disease was December 08, 2005. He had a cardiac catheterization which showed an ejection      fraction of 45%, anterior apical hypokinesia, patent overlapping      stents to the LAD with a 60% to 70% ostial stenosis of the second      diagonal, which had been jailed by previous stent placement, had      non obstructive disease in the circumflex, and right coronary      artery. A follow up 2D echocardiogram actually showed normal left      ventricular function with an ejection fraction of 55% to 65% on      January 10, 2006.  2. Hyperlipidemia. He is currently enrolled in the Improve-It Study.   He offers no complaints today except he could not get his metoprolol  filled the way he wanted it.   His meds are;  1. Nexium 40 mg a day.  2. Hydrocodone.  3. Sudafed b.i.d.  4. Improve-It study drug.  5. Metoprolol 12.5 b.i.d.  6. Calcium.  7. Aspirin 81 mg a day.  8. Ocuvite.   His blood pressure today is 114/76, his pulse is 60 and regular. EKG is  unchanged.  Carotid upstrokes are equal bilaterally without bruits. No JVD. Thyroid  is not enlarged. Trachea is midline.  LUNGS: Clear.  HEART: Reveals a regular rate and rhythm. There is no murmur, rub, or  gallop.  ABDOMINAL EXAM: Soft, good bowel sounds. No midline bruits. There is no  hepatomegaly.  EXTREMITIES: Reveal no cyanosis, clubbing, or edema. Pulses are present.   [name] is doing well. I have renewed his metoprolol. We will see him  back again in  January 2009. At that time he will need a stress Myoview.     Thomas C. Daleen Squibb, MD, [facility]  Electronically Signed    TCW/MedQ  DD: 06/26/2007  DT: 06/27/2007  Job #: [id number]   cc:   Ellin Saba., MD

## 2011-04-19 NOTE — Op Note (Signed)
[name], [name]                ACCOUNT NO.:  [id number]   MEDICAL RECORD NO.:  [id number]          PATIENT TYPE:  INP   LOCATION:  0004                         [facility]ACILITY:  [facility]   PHYSICIAN:  Madlyn Frankel. Charlann Boxer, M.D.  DATE OF BIRTH:  September 26, 1930   DATE OF PROCEDURE:  05/06/2008  DATE OF DISCHARGE:                               OPERATIVE REPORT   PREOPERATIVE DIAGNOSIS:  Failed right total knee replacement.   POSTOPERATIVE DIAGNOSIS:  Failed right total knee replacement.   PROCEDURE:  Revision right total knee replacement utilizing DePuy  rotating platform revision set with a size 4 femur with a +2 five degree  bolt, 13 x 30 cemented stem, a 4 mm distal lateral augment and an 8 mm  posterolateral augment, no augments medially.  Tibia was a size 5 MBT  revision tray with 5 mm size 4 medial and lateral augments, a MBT  revision tray with a 13 x 30 stem.  I utilized a 15 mm posterior  stabilized insert.   SURGEON:  Madlyn Frankel. Charlann Boxer, M.D.   ASSISTANT:  Dwyane Luo, PA-C.   ANESTHESIA:  Duramorph, spinal.   COMPLICATIONS:  None.   TOURNIQUET TIME:  One-hundred minutes at 250 mmHg.   DRAINS:  Times one.   COMPLICATIONS:  None.   INDICATIONS FOR PROCEDURE:  Mr. Sakuma is a pleasant 77 year old  gentleman who presented to the office for right knee pain.  Radiographs  revealed a loose total knee replacement.  This was confirmed with  studies.  The lab work was negative for infection preoperatively.  The  risks and benefits of operative intervention were discussed.  Based on  the persistence of discomfort, he wished to proceed with this.  Consent  was obtained.   PROCEDURE IN DETAIL:  The patient was brought to the operative theater.  Once adequate anesthesia and preoperative antibiotics administered, 2 gm  of Ancef, the patient was positioned supine.  A proximal thigh  tourniquet was placed.  Bony prominences were padded.  The leg  positioners and holders were appropriately  position.   At this point, the right lower extremity was prescrubbed and prepped and  draped in a sterile fashion.  The patient's old incision was utilized.  A midline incision was made, followed by an arthrotomy revealing clear  synovial fluid.  Following initial debridement, I recreated the medial  and lateral gutters, as well as the suprapatellar pouch to do a  synovectomy.  This allowed for exposure.  I put a smooth pin in the  proximal tibia to prevent any avulsion injury off the tibial tubercle.  Following exposure without the need of a quadriceps snip, I was able to  expose the knee and flex the knee up.  I used an osteotome to remove the  tibial insert.  It was immediately obvious that the tibial component was  loose.  Following further debridement, particularly peeling off the  proximal medial structures to allow for expose of the tibia, I attended  to the femur first.  I utilized a small oscillating saw utilized for ACL  surgery to undermine the bone  cement interface on the femur.  I then  used osteotomes to remove the femoral component without difficulty.  I  was then able to subluxate the tibia enough to remove the femoral tibial  component without difficulty.   At this point, I attended to the tibia.  The extramedullary guide was  positioned allowing for 2 degrees of posterior slope.  It was  perpendicular to the shaft of the tibia.  I then made a tibial resection  cut a few mm off the lateral side.  I basically made a cut, knowing that  the tibial component had subsided in the posteromedial position.  I made  this cut off the anteromedial side.  There was a little bit of gap  posteriorly, but this was okay.  I resected some of the bone laterally.  The bone was noted to have a significant osteolytic change to it.  At  this point, I debrided this bone, as well as the proximal tibia.  I  checked the cut surface and felt that a size 5 fit best on the cut  surface of the tibia,  which was giving me excellent cortical bone  contact.   I used an osteotome to debride some of cement from the proximal central  tibia.  I then reamed up to a size 15 x 30, and then did the drilling  for the stem.  Based on the debridement as necessary, the proximal tibia  has osteoid bone and chose to use a 29 mm cemented sleeve as opposed to  using any keels.   I went ahead and prepared this with a 29 cemented sleeve broach and then  put a trial component in positioned and oriented it to the central  portion of the tibial tubercle.  I checked the alignment rod and was  happy that it had passed through the center of the tibia.   At this point now, I tended to the femur.  I checked just the basic size  of the femur.  Following removal of the component, it looked as a size 4  would fit best in the anterior-posterior dimensions.  I went ahead and  reamed the femur and created a hole in the distal femur.  I then passed  a canal finder and then irrigated.  I then reamed up for a 15 x 30 mm  stem.   I then passed a 14 mm reamer into the femur for orientation and then  placed a 16 mm sleeve to keep it oriented in its correct orientation.  A  4-mm block was then positioned and based on the cut surface of the tibia  and trial component of the tibia, I made my orientation.  It was pinned  in position.  No bone was cut off the anterior aspect.  Posteriorly, I  resected the neutral on the lateral side.  There was some cut off the  posteromedial side, but none on the lateral side.  Based on this, I  chose to use an augment posterior lateral.   Note that prior to this, I did check with the distal cutting block.  No  bone was resected off the medial side with 0 mm cut, and 4 mm was  resected or just freshened up on the lateral side distally.  So at this  point, I did a trial component with a size 4 with a 13 x 30 stem.  I  used a distal lateral augmented size 4.  I ended up using the  size 8   posterolateral augment which helped with my external rotation.  This fit  nicely.  Trial reduction was now carried out with the femoral component  and the tibial component in place.  At this point, I trialed up to a 17  mm poly with a slight bit of hyperextension still.  The patella tracked  out application of pressure.  Once this was all taken care of, I  determined that I would go ahead and use 5 mm augments on the tibia,  allowing me to build up the joint line, but use less of a polyethylene  insert.   At this point, all trial components were removed.  Further debridements  and irrigation of the knee was carried out pulse lavage.  Once this was  prepared, the cut surfaces were dried and prepared for cement.  The  final components were opened on the back table.  We constructed the  tibial and femoral components.  Once this was done, cement was mixed.  The cement was injected in the canal in a retrograde fashion.  I placed  a cement restrictor in both the proximal tibia and distal femur, a size  6 on the femur and a 5 on the tibia.  These were at appropriate depths.  The cement was then injected in a retrograde fashion and dried tibial  and femoral components or canals.  The components were cemented into  position.  The knee was brought out to extension with initially a 15 mm  insert.  Extruded cement was removed.  With the 5 mm augment and a 15 mm  insert, the knee came out to full extension and was stable from  extension and flexion.  Once the cement had cured and excessive cement  was debrided, I rechecked the backside of the knee to remove any excess  cement.  Once I was satisfied there was no further debris, the tibial  surface was cleaned and irrigated again.  The final 15 mm insert was  then placed.  The knee was reduced.  The knee was reirrigated with  normal saline solution.  A medium Hemovac drain was placed deep.  I  reapproximated the extensor mechanism with #1 Vicryl.  The  tourniquet  was let down at 100 minutes at 250 mmHg.  The remaining wound at this  point was closed 2-0 Vicryl and staples on the skin.  The skin was  cleaned, dried and dressed sterilely with Adaptic and a sterile bulky  Jones dressing.  He was brought to the recovery room in stable condition  under spinal anesthesia.      Madlyn Frankel Charlann Boxer, M.D.  Electronically Signed     MDO/MEDQ  D:  05/06/2008  T:  05/06/2008  Job:  [id number]

## 2011-04-19 NOTE — H&P (Signed)
[name], [name]                ACCOUNT NO.:  [id number]   MEDICAL RECORD NO.:  [id number]          PATIENT TYPE:  INP   LOCATION:  NA                           [facility]ACILITY[facility]   PHYSICIAN:  Madlyn Frankel. Charlann Boxer, M.D.  DATE OF BIRTH:  November 08, 1930   DATE OF ADMISSION:  05/06/2008  DATE OF DISCHARGE:                              HISTORY & PHYSICAL   PROCEDURE:  Revision right total knee arthroplasty.   CHIEF COMPLAINTS:  Right knee pain.   HISTORY OF PRESENT ILLNESS:  [name] is a very pleasant 77 year old  gentleman with a history of right knee pain secondary to a loosening and  failing right total knee replacement.  He has had a bone scan which was  very concerning for loosening.  Due to persistent problems, pain, and  diminished quality of life, he has been presurgically assessed for  revision right total knee replacement.   PAST SURGICAL HISTORY:  1. Prostate surgery in 1992 and 1993.  2. Retina surgery in 1989 and 1996.  3. Multiple knee surgeries in 1999, 2000, and 2001.  4. Appendectomy, remote past.  5. Cardiac stent in 2004.   FAMILY HISTORY:  Cancer, arthritis.   SOCIAL HISTORY:  Married.  Primary caregiver will be wife and himself  after surgery.   DRUG ALLERGIES:  NO KNOWN DRUG ALLERGIES.   MEDICATIONS:  1. PreserVision 2 daily.  2. Calcium plus vitamin D p.o. daily.  3. 81 mg aspirin p.o. daily.  4. Metoprolol 12.5 mg p.o. b.i.d.  5. Sudafed b.i.d.  6. Hydrocodone 10 mg/325, 1/2 pill t.i.d.  7. Prilosec 20 mg p.o. daily.   REVIEW OF SYSTEMS:  DERMATOLOGY:  Has eczema.  Otherwise, see HPI.   PHYSICAL EXAMINATION:  VITAL SIGNS:  Pulse 64, respirations 18, blood  pressure 118/72.  GENERAL:  Awake, alert and oriented, well developed, well nourished, in  no acute distress.  NECK:  Supple.  No carotid bruits.  CHEST:  Lungs clear to auscultation bilaterally.  BREASTS:  Deferred.  HEART:  Regular rate and rhythm.  S1, S2 distinct.  ABDOMEN:  Soft, nontender,  nondistended.  Bowel sounds present.  GENITOURINARY:  Deferred.  EXTREMITIES:  He does come out to full extension with the right knee.  His ligaments are stable.  He has a positive dorsalis pedis pulse.  SKIN:  He has a midline incision.  No signs of cellulitis.  NEUROLOGIC:  He has intact distal sensibilities.   Labs, EKG, and chest x-ray all pending presurgical testing.   IMPRESSION:  Failed right total knee arthroplasty.   PLAN OF ACTION:  Revision right total knee arthroplasty at [facility] on May 06, 2008 by surgeon Dr. Durene Romans.  Risks and  complications were discussed.  Questions were encouraged, answered, and  reviewed.   Postoperative medications were provided at time of history and physical,  which included Lovenox, Robaxin, iron, aspirin, Colace, and MiraLax.  The patient would prefer [facility] [place], as his  home health care PT adversary.  We will try to accommodate this as best  we can  at time of discharge.  Questions were encouraged, answered, and  reviewed.     ______________________________  Yetta Glassman [address], [place]      Madlyn Frankel. Charlann Boxer, M.D.  Electronically Signed    BLM/MEDQ  D:  05/02/2008  T:  05/02/2008  Job:  [id number]   cc:   Ellin Saba., MD  [address] [place]  [place] [address]   Jesse Sans. Wall, MD, FACC  [address]  [address]  [place]  [place] [address]

## 2011-04-22 NOTE — Assessment & Plan Note (Signed)
[name]                            CARDIOLOGY OFFICE NOTE   [name]                       MRN:          [id number]  DATE:11/24/2006                            DOB:          1930/03/08    HISTORY OF PRESENT ILLNESS:  [name] returns today for further  management of coronary artery disease. He has had no angina or chest  discomfort. He had a repeat catheterization July 02, 2006 for chest  pain. His stent was widely patent. His lipids are being followed by the  [employer] through the [employer]. He had to stop Plavix  because of hematuria. His medication are outlined on the maintenance  medication list.   PHYSICAL EXAMINATION:  VITAL SIGNS:  His blood pressure today is 130/72,  pulse 70 and regular. Weight 197.  HEENT:  Normocephalic and atraumatic. PERRLA. Extraocular muscles  intact. Sclerae clear. Facial asymmetry normal. NECK:  Carotid upstrokes  are equal bilaterally without bruits. There is no JVD. Thyroid is not  enlarged. Trachea midline.  LUNGS:  Clear.  ABDOMEN:  Soft with good bowel sounds.  EXTREMITIES:  No clubbing, cyanosis, or edema. Pulses are brisk.  MUSCULOSKELETAL:  Examination is intact.  SKIN:  Examination intact.   LABORATORY DATA:  EKG is normal except for some PAC's.   IMPRESSION/PLAN:  [name] is doing well. I have made no changes in his  program. I will see him back in July 2008.     Derrick C. Daleen Squibb, [name], [name]  Electronically Signed    TCW/MedQ  DD: 11/24/2006  DT: 11/25/2006  Job #: [id number]

## 2011-04-22 NOTE — H&P (Signed)
[name], [name]                            ACCOUNT NO.:  [id number]   MEDICAL RECORD NO.:  [id number]                   PATIENT TYPE:  INP   LOCATION:  1823                                 FACILITY:  [facility]   PHYSICIAN:  Thomas C. Wall, M.D.                DATE OF BIRTH:  [birth year]   DATE OF ADMISSION:  07/03/2003  DATE OF DISCHARGE:                                HISTORY & PHYSICAL   CHIEF COMPLAINT:  I woke up this morning and after getting up and moving  around developed chest tightness, pressure, and hurting in both arms with  numbness and I broke out in a sweat.   HISTORY OF PRESENT ILLNESS:  Mr. [name] is a 77 year old white male with no  previous history of coronary artery disease who had this event this morning.  It lasted about 30 minutes. He has had a history of reflux, but he said this  clearly was not GI symptoms that he is accustomed to. He exercises on a  regular basis, particularly swimming. He does not have exertional  symptomatology.   PAST MEDICAL HISTORY:  He avoids NSAIDs and Demerol. He is on Nexium 40 mg a  day, hydrocodone one-half b.i.d.   There is no history of diabetes, hypertension, hyperlipidemia, family  history, or tobacco use. He does have a history of gastroesophageal reflux  disease. There is a history of anemia, nephrolithiasis, detached retina,  prostatic cancer, and chronic sinusitis.   PAST SURGICAL HISTORY:  A right total knee replacement, left total knee  replacement, and prostate surgery.   SOCIAL HISTORY:  Lives in [place] with his wife. He does not smoke or  drink. 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   FAMILY HISTORY:  Really noncontributory.   REVIEW OF SYSTEMS:  Noncontributory.   PHYSICAL EXAMINATION:  VITAL SIGNS:  Blood pressure 137/69, pulse 67 and  regular, respiratory rate 18 and unlabored. Temperature is 97.3. Saturations  96% on room air.  GENERAL:  He is a very pleasant male in no acute distress.  HEENT:   Unremarkable.  NECK:  No JVD. Good carotid upstrokes with no bruits. Thyroid is not  enlarged.  LUNGS:  Clear.  HEART:  Regular rate and rhythm.  ABDOMEN:  Soft. Good bowel sounds are present. There is no tenderness.  EXTREMITIES:  No cyanosis, clubbing, or edema.  NEUROLOGIC:  Intact.   LABORATORY DATA:  Chest x-ray shows some mild cardiac enlargement and no  acute cardiopulmonary disease otherwise. EKG shows a rate of 60, sinus  rhythm. He has early R-wave progression in V2. Early repolarization as well.   Creatinine 0.9, CPK negative and troponin initially is negative. Coags are  normal.   ASSESSMENT:  New onset chest discomfort quite worrisome with a possible  acute coronary syndrome. He does not have a lot of cardiac risk factors  other than age and sex.   PLAN:  1. Admit to telemetry.  2. Serial enzymes.  3. After much discussion about a stress test versus a cardiac     catheterization he and I decided that a cath would be the most accurate     and also the least likely to miss any significant disease. In addition, I     am unanxious to stress him with the above dramatic presentation of new     onset. I am worried about a ruptured plaque.  4. Gastroesophageal reflux disease. Continue Nexium.  5. History of anemia.  6. Nephrolithiasis.  7. History of detached retina.  8. History of prostate cancer.  9. History of chronic sinusitis.  10.      History of bilateral total knee replacements.                                                Thomas C. Wall, M.D.    TCW/MEDQ  D:  07/03/2003  T:  07/03/2003  Job:  [id number]   cc:   Ellin Saba., M.D.  [address] [place]  [place] [address]  Fax: [phone]   Jesse Sans. Wall, M.D.

## 2011-04-22 NOTE — Discharge Summary (Signed)
[name], [name]                ACCOUNT NO.:  [id number]   MEDICAL RECORD NO.:  [id number]          PATIENT TYPE:  INP   LOCATION:  1618                         FACILITY:  [facility]   PHYSICIAN:  Madlyn Frankel. Charlann Boxer, M.D.  DATE OF BIRTH:  1929/12/31   DATE OF ADMISSION:  05/06/2008  DATE OF DISCHARGE:  05/09/2008                               DISCHARGE SUMMARY   ADMITTING DIAGNOSES:  1. Osteoarthritis.  2. Hypertension.  3. Dyslipidemia.  4. Coronary artery disease.   DISCHARGE DIAGNOSES:  1. Osteoarthritis.  2. Hypertension.  3. Dyslipidemia.  4. Coronary artery disease.   HISTORY OF PRESENT ILLNESS:  [name] is a very pleasant 77 year old  gentleman with a history of right knee pain secondary to loosening and  failing right total knee replacement.  He had a bone scan which was very  concerning for loosening.  Due to persistent problems, pain and  diminished quality of life, he was presurgically assessed for a revision  right total knee replacement.   PROCEDURE:  Revision right total knee arthroplasty by surgeon Dr.  Durene Romans, assistant Dwyane Luo PA-C.   CONSULTS:  None.   LABORATORIES:  CBC on admission:  Hemoglobin 14, hematocrit 41.5,  platelets 164.  At time of discharge:  Hemoglobin 9.3, hematocrit 27,  platelets 126.  His white cell differential all within normal limits.  His coagulation all within normal limits.  Routine chemistry panel  preadmission:  Sodium 145, potassium 4.2, glucose 85, creatinine 0.93.  Prior to discharge postoperatively:  Sodium 135, potassium 3.9, glucose  136, creatinine 0.94.  Kidney function:  His GFR remained greater than  16 and his calcium was 8.2 at discharge.  His UA was negative.   RADIOLOGY:  Chest two-view:  No active cardiopulmonary disease.  There  was an old calcified granuloma in the right midlung.   CARDIOLOGY:  Full cardiology workup by Dr. Daleen Squibb prior to admission which  demonstrated an EKG with a sinus rhythm and  ejection fraction of 60%.  Normal stress nuclear study.   HOSPITAL COURSE:  The patient admitted to the hospital and underwent  revision right total knee replacement, admitted to the orthopedic floor.  He put out a lot with his Hemovac and we had to clamp it off.  Seen on  day #1 postoperatively.  No complaints, afebrile, hemodynamically  stable.  His dressing was dry.  We did discontinue his Hemovac at that  time.  He was neurovascularly intact to his right lower extremity, was  able to do a straight-leg raise.  He was PT, OT, weightbearing as  tolerated and we started the Lovenox.  He made moderate progress with  his physical therapy.  Seen the next day, continued to do well.  Dressing was changed, the wound looked great.  He was neurovascularly  intact, still continued to be able to do a straight leg raise, and we  heplocked his IV.  Home health care was selected.  When we saw him on  June 5 he seemed to be a little bit overly concerned about his ability  to control his pain  medicine and CPM usage.  He was a little bit overly  agitated.  Otherwise, he was doing well, afebrile, dressings changed,  wound looked great, straight-leg raise, and was ready for discharge home  where he could control his medicine use a little bit better.   DISCHARGE DISPOSITION:  Discharged home stable and improved condition.   DISCHARGE PHYSICAL THERAPY:  Weightbearing as tolerated with the use of  rolling walker.   DISCHARGE DIET:  Regular.   DISCHARGE WOUND CARE:  Keep dry.   DISCHARGE MEDICATIONS:  1. Lovenox 40 mg subcu q.24h. x11 days.  2. Robaxin 500 mg p.o. q.6h. muscle spasm pain.  3. Enteric-coated aspirin 325 mg p.o. daily after Lovenox completed.  4. Iron 325 mg one p.o. t.i.d. x2 weeks.  5. Colace 100 mg p.o. b.i.d. p.r.n. constipation.  6. MiraLax 17 g p.o. daily.  7. Vicodin 5/325 one to two p.o. q.4-6h. p.r.n. pain.  8. Metoprolol 25 mg a half tablet twice daily.  9. PreserVision  twice daily.  10.Calcium 500 plus D one tablet three times daily.  11.Prilosec over-the-counter one tablet p.o. q.a.m.  12.Pseudoephedrine 30 mg one twice daily p.r.n.  13.Study medications for cholesterol blind study, he has his own      medicines.   DISCHARGE FOLLOWUP:  Follow with Dr. Charlann Boxer at phone number [phone] in 2  weeks for wound care check.     ______________________________  Yetta Glassman. [address], [place]      Madlyn Frankel. Charlann Boxer, M.D.  Electronically Signed    BLM/MEDQ  D:  06/02/2008  T:  06/02/2008  Job:  [id number]

## 2011-04-22 NOTE — Discharge Summary (Signed)
[place]. [facility]  Patient:    OWAIS, PRUETT                         MRN: [id number] Adm. Date:  [id number] Disc. Date: [id number] Attending:  Faith Rogue T Dictator:   Kirstin Adelberger, P.A.                           Discharge Summary  ADMISSION DIAGNOSIS:  End-stage degenerative joint disease left knee.  DISCHARGE DIAGNOSIS:  End-stage degenerative joint disease left knee.  HOSPITAL COURSE:  Postop day 1, the patient was started on Coumadin. He tolerated this well. He was started on physical therapy. The patient postop day 1 had some shortness of breath and chest pain. No EKG changes. No drop in saturation. He was up with physical therapy during the day. Cardiac enzymes remained normal. Postop day 2, the patient still continued to do well. He stated he was ready to go to rehab. Hemoglobin was 7.7. He was transfused with 2 units of autologous blood with Lasix in between. He tolerated this well and was transferred to rehab on postop day 2 in stable condition.  DISCHARGE MEDICATIONS: 1. Percocet one to two p.o. q.4-6h. p.r.n. pain. 2. Colace 100 mg one p.o. b.i.d. 3. Coumadin per pharmacy protocol.  FOLLOW-UP:  We will continue to follow him on rehab. DD:  03/29/00 TD:  03/29/00 Job: [id number]

## 2011-04-22 NOTE — Cardiovascular Report (Signed)
NAMECOTTON, BECKLEY                            ACCOUNT NO.:  [id number]   MEDICAL RECORD NO.:  [id number]                   PATIENT TYPE:  INP   LOCATION:  1823                                 FACILITY:  [facility]   PHYSICIAN:  Salvadore Farber, M.D.             DATE OF BIRTH:  07/12/1930   DATE OF PROCEDURE:  07/03/2003  DATE OF DISCHARGE:                              CARDIAC CATHETERIZATION   PROCEDURE:  Left heart catheterization, left ventriculogram, coronary  angiography, stent to the proximal and mid left anterior descending,  intravascular ultrasound of the left anterior descending.   INDICATIONS:  Mr. [name] is a 77 year old gentleman with no prior history of  cardiovascular disease.  He presented today with new onset chest discomfort  occurring initially with exertion and subsequently at rest.  Came to the  emergency room where electrocardiogram had 1 mm ST elevation in V2 alone.  He was pain-free.  He was brought urgently to the cardiac catheterization  laboratory for diagnostic angiography with percutaneous intervention.   PROCEDURAL TECHNIQUE:  Informed consent was obtained.  Under 1% lidocaine  local anesthesia a 6-French sheath was placed in the right femoral artery  using the modified Seldinger technique.  Diagnostic angiography and  ventriculography were performed using JL4, JR4, and pigtail catheters.  The  case then proceeded to intervention.   Diagnostic angiography demonstrated a 95% stenosis of the proximal LAD just  proximal to the takeoff of a moderate sized diagonal branch.  This sat in a  segment of diffuse disease covering much of the proximal and mid LAD.  Plan  was made to treat this percutaneously.  Eptifibatide was administered in a  double bolus fashion.  300 mg Plavix was administered orally.  Additional  heparin was given to achieve and maintain an ACT of greater than 200  seconds.   The sheath was up sized to 7-French.  A 7-French CLS 3.5 guide was  advanced  over a wire and engaged in the ostium of the left main coronary.  A Luge  wire was advanced across the lesion into the diagonal branch to provide  protection.  A second Luge wire was then manipulated into the distal LAD  without difficulty.  The LAD was then pre dilated using a 3.0 x 20 mm  Maverick at 6 atmospheres.  This did not result in substantial compromise of  the diagonal branch.  Therefore, the diagonal wire was removed.  The more  proximal portion of the LAD lesion was stented using a 3.5 x 32 mm Taxus at  12 atmospheres.  The more distal lesion was then stented using a 3.0 x 24 mm  Taxus positioned so as to overlap the distal end of the previously placed  stent.  It was deployed at 14 atmospheres.  The entirety of both stents were  then post dilated.  Most distally a 3.25 mm PowerSail was used at 14  atmospheres.  In the mid portions of the stent a 3.75 mm Quantum balloon was  inflated to 14 atmospheres distally and 18 atmospheres across the region of  overlap.  Finally, the proximal lesion of the more proximal stent was post  dilated using a 4.0 x 18 mm PowerSail at 16 atmospheres.  Again,  intravascular ultrasound was then performed to confirm stent apposition and  expansion.  The IVUS catheter was advanced to the distal LAD.  Automated  pullback was performed.  This confirmed full stent expansion with excellent  apposition.  Final angiography demonstrated no residual stenosis and TIMI 3  flow to the distal vessel.  There was a new 70% stenosis at the ostium of  the diagonal branch.  However, flow to this vessel remained TIMI 3 and the  patient was free of chest discomfort and electrocardiographic abnormality.  Decision was therefore made to treat this conservatively.   The patient tolerated the procedure well and was transferred to the holding  room in stable condition.   COMPLICATIONS:  None.   FINDINGS:  1. LV 137/10/14.  EF 65% without regional wall motion  abnormality.  2. No aortic stenosis or mitral regurgitation.  3. Left main:  Angiographically normal.  4. LAD:  The LAD is diffusely diseased in proximal and mid section with a     focal area of more severe disease just proximal to the takeoff of a     diagonal branch.  This more severe disease is approximately 95%.  The     entire area of disease in the proximal mid LAD was stented using a series     of two overlapping drug eluting stents with excellent angiographic     result.  5. Ramus intermedius:  Small vessel.  There is 80% stenosis proximally (2.0     mm vessel).  6. Circumflex:  The circumflex is moderate sized vessel giving rise to two     obtuse marginals.  There is 20% stenosis of the proximal vessel and a 30%     stenosis after the takeoff of the first marginal.  7. RCA:  Moderate sized, but dominant vessel.  There is a 30% stenosis     proximally, 60% stenosis of the mid vessel, and 50% stenosis just prior     to the takeoff of the PDA.   IMPRESSION/RECOMMENDATIONS:  Successful stenting of the proximal and mid  left anterior descending using drug eluting stents.  Final angiograms  demonstrated no residual stenosis and TIMI 3 flow in the left anterior  descending.  There was a 70% stenosis in the jailed moderate sized diagonal  branch.  Plan is to manage this conservatively as the patient is without  chest discomfort or electrocardiographic abnormality.   Will plan medical management of his residual ramus and right coronary artery  disease with exercise tolerance test to be obtained to exclude significant  ischemia in the right coronary artery and diagonal territories.  This will  be obtained in approximately eight weeks.  Aspirin should be continued  indefinitely.  Plavix will be continued for one year given his unstable  presentation.  Eptifibatide will be continued for 18 hours.  Sheaths will be  removed when the ACT is less than 150 seconds.  Salvadore Farber, M.D.    WED/MEDQ  D:  07/03/2003  T:  07/04/2003  Job:  [id number]   cc:   Ellin Saba., M.D.  [address] [place]  [place] [address]  Fax: [phone]   Jesse Sans. Wall, M.D.

## 2011-04-22 NOTE — Op Note (Signed)
[place]. [facility]  Patient:    [name], [name]                         MRN: [id number] Proc. Date: 12/29/00 Adm. Date:  [id number] Attending:  Twana First                           Operative Report  PREOPERATIVE DIAGNOSIS:  Right knee degenerative joint disease.  POSTOPERATIVE DIAGNOSIS:  Right knee degenerative joint disease.  PROCEDURES:  1. Right total knee replacement using [employer] Scorpio total knee system     with femoral component #11,cemented; tibial component #11, cemented; with     12 mm polyethylene tibial spacer.  2. Patella #28, cemented.  SURGEON:  Elana Alm. Thurston Hole, M.D.  ASSISTANT:  Kirstin A. Shepperson, P.A.  ANESTHESIA:  Spinal/epidural.  OPERATIVE TIME:  1 hour 45 minutes.  COMPLICATIONS:  None.  DESCRIPTION OF PROCEDURE:  Mr. Davenport was brought to the operating room on December 29, 2000, placed on the operative table in supine position.  After an adequate level of spinal/epidural anesthesia was administered by Dr. Jacklynn Bue of anesthesia, in a lateral position, he was turned back supine.  His right knee was examined under anesthesia.  Range of motion 0-125 degrees with moderate varus deformity.  Knee stable ligamentous exam with normal patella tracking.  He had a Foley catheter placed under sterile conditions and received Ancef 1 gm IV preoperatively for prophylaxis.  The right leg was then prepped using sterile Betadine, and draped using sterile technique.  The leg was exsanguinated and a thigh tourniquet elevated to 350 mm.  Initially through a 20 cm longitudinal incision based over the patella, initial exposure was made.  The underlying subcutaneous tissues were incised along with the skin incision.  A median arthrotomy was performed revealing an excessive amount of normal-appearing joint fluid.  The articular surfaces were inspected.  He had grade 4 changes medially, grade 3 and 4 changes laterally and in the  patellofemoral joint.  The medial and lateral meniscal remnants were removed along with the anterior cruciate ligament.  Osteophytes were removed from the femoral condyles and the tibial plateau.  Intramedullary drilled and drilled up the femoral canal for placement of the distal femoral cutting jig which was placed in the appropriate amount of rotation, and then a distal 12 mm cut was made.  After this was done the distal femur was sized and #11 was found to be the appropriate size, and the #11 cutting jig was placed in the appropriate rotation and these cuts were made.  The proximal tibia was then exposed.  Tibial spines were removed with an oscillating saw. Intramedullary drill drilled down the tibial canal for placement of the proximal tibial cutting jig, which was then placed in the appropriate amount of rotation and a proximal 6 mm cut was made.  After this was done, then the PCL cutters were placed back on the femur and these cuts were made.  After this was done, then the #11 femoral component trial was placed, hammered into position with an excellent fit.  The #11 tibial tray with a 12 mm spacer was placed on the tibial cut surface.  The knee was taken through a full range of motion, found to be stable.  After this was done the tibial tray was marked for rotation and then the keel cut was made.  After this was done, the  patella was sized; 28 mm was found to be the appropriate size and a recessed 10 mm x 28 mm cut was made and three locking holes placed.  After this was done it was felt that all the trial components were of excellent size, fit, and stability.  They were removed.  The knee was then jet lavaged irrigated with 3 L of saline solution.  The tibial component, #11, with cement backing was hammered into position with an excellent fit, with excess cement being removed from around the edges.  The femoral component #11 with cement backing was hammered into position, also with an  excellent fit with excess cement being removed from around the edges.  The 12 mm polyethylene spacer was then locked onto the tibial base plate.  The knee taken through a range of motion, 0-125 degrees, with excellent stability and no lift-off on the tray.  The 28 mm patella was then locked into its recessed hole and held there with a clamp while the cement hardened.  After the cement hardened, patellofemoral tracking was evaluated.  This was found to be intact and normal.  At this point it was felt that all the components were of excellent size, fit and stability.  The wound was further irrigated with antibiotic solution.  The arthrotomy was closed with #1 Panacryl suture over two medium Hemovac drains.  Subcutaneous tissue was closed with 0 and 2-0 Vicryl.  Skin closed with skin staples. Sterile dressings were applied and a long-leg splint.  The Hemovac injected with 0.25% Marcaine with epinephrine, and clamped.   The patient then was transferred to the recovery room in a stable condition.  Needle and sponge counts correct x 2 at the end of the case. DD:  12/29/00 TD:  12/29/00 Job: [id number]

## 2011-04-22 NOTE — Discharge Summary (Signed)
[place]. [facility]  Patient:    [name], [name]                         MRN: [id number] Adm. Date:  [id number] Disc. Date: [id number] Attending:  Twana First Dictator:   Kirstin A. Shepperson, P.A.-C.                           Discharge Summary  ADMITTING DIAGNOSES: 1. End-stage degenerative joint disease right knee. 2. History of kidney stones. 3. History of chronic sinusitis. 4. History of detached retina in the right eye.  DISCHARGE DIAGNOSES: 1. End-stage degenerative joint disease right knee, status post total knee    replacement. 2. History of kidney stones. 3. History of chronic sinusitis. 4. History of detached retina in the right eye.  HISTORY OF PRESENT ILLNESS:  The patient is a 77 year old white male with a history of bilateral knee DJD with a left total knee replacement February 23, 2000, tolerated this procedure well, now ready for right total knee replacement.  He has pain at night, pain with rest, pain at all times, unrelieved by anti-inflammatories and interarticular cortisone injections or ______ .  He understands the risks, benefits, and possible complications of a right total knee replacement and is without questions.  PROCEDURES IN-HOUSE:  On December 29, 2000, the patient underwent a right total knee replacement.  He tolerated the procedure well.  HOSPITAL COURSE:  Postop day one, surgical wounds were well approximated. Hemoglobin was 9.  Surgical wound was well approximated.  T-max of 101. Postop day two, hemoglobin 9.1, sodium 139, potassium 4.6.  Postop day three, T-max of 100.8.  He had some bruising on his posterior thigh.  Surgical wound was well approximated.  His hemoglobin was 8.7, and his sodium was 139, potassium 4.6.  Postop day three, hemoglobin was 8.7.  Surgical wound was well approximated, sodium was 139, potassium 4.2.  Postop day four, surgical wound was well approximated.  His hemoglobin was 8.5.  He was  having some shortness of breath; therefore, he was transfused with one unit of autologous blood. Postop day five, hemoglobin was 11.2 posttransfusion.  He was afebrile upon discharge.  Surgical wound was well approximated.  He was ambulating 150 feet. He was discharged to home in stable condition with a CPM machine, physical therapy three times a week, nurse coming out to draw protimes to monitor his Coumadin.  DISCHARGE MEDICATIONS: 1. Percocet 1-2 q.4-6h. p.r.n. pain. 2. Coumadin as directed per pharmacy protocol. 3. Sudafed. 4. Celebrex 200 mg 1 p.o. q.d.  FOLLOW-UP:  We will see him back in the office in 1-1/2 weeks for staple removal and x-rays. DD:  01/22/01 TD:  01/22/01 Job: [id number]

## 2011-04-22 NOTE — Cardiovascular Report (Signed)
[name], [name] NO.:  [id number]   MEDICAL RECORD NO.:  [id number]          PATIENT TYPE:  INP   LOCATION:  6522                         FACILITY:  [facility]   PHYSICIAN:  [name], M.D.     DATE OF BIRTH:  21-Jan-1930   DATE OF PROCEDURE:  DATE OF DISCHARGE:                              CARDIAC CATHETERIZATION   [name] is a 75-year patient of Dr. Daleen Squibb.  He has previously had  angioplasty and stenting of LAD by Dr. Joycelyn Rua in 2004.  He was admitted  with chest pain.   Cine catheterization done from the right femoral artery.   26-French catheters were used.   Left main coronary artery had a 20% discrete stenosis.   The ostial and proximal LAD had 30% tubular disease. The overlapping stents  in the mid vessel were widely patent. There is a small ramus branch with  diffuse disease. The second diagonal branch had a 70% eccentric ostial  stenosis and was jailed by the stent.  Both the ramus and diagonal branches  were small and really unchanged from catheterization in 2004.  Dr. Samule Ohm  initially decided to treat the ostial jailed diagonal lesion conservatively  and I would not re-intervene on this.   The circumflex coronary artery was actually larger than the right coronary  artery but nondominant. There is a 30% tubular lesion proximally. The first  and second OM branches were normal.   The right coronary artery was a very small artery. There was 40-50% tubular  disease throughout the mid and distal vessel. The PDA was very small and  diffusely disease.   RAO ventriculography:  RAO ventriculography showed anterior apical wall  hypokinesis with an EF in the 40-45% range.   There is no gradient across aortic valve and no MR. Aortic pressure is  144/72, LV pressure was 140/16.   IMPRESSION:  The patient has not had chest pain up until yesterday evening.  I am not sure that the pain was cardiac. He has had a history of that bad  GERD and reflux.   The patient did appear to have an anterior apical wall motion abnormality.  It may be worthwhile as an outpatient to follow up with an echo to assess  his LV function and add an ACE inhibitor. He has overlapping DES stents in  the LAD and it also may be worthwhile to reinstitute Plavix.   He tolerated the procedure well. For the time being we will continue his  aspirin, Lipitor or Nexium.  He will follow up with Dr. Samule Ohm as an  outpatient in 2-3 weeks. At that time, Dr. Samule Ohm can decide whether he  wants to reassess his LV function by echo and consider adding an ACE  inhibitor or Plavix back.           ______________________________  [name], M.D.     PN/MEDQ  D:  12/09/2005  T:  12/09/2005  Job:  [id number]

## 2011-04-22 NOTE — H&P (Signed)
[name], [name]                ACCOUNT NO.:  [id number]   MEDICAL RECORD NO.:  [id number]          PATIENT TYPE:  INP   LOCATION:  1832                         FACILITY:  [facility]   PHYSICIAN:  Thomas C. Wall, M.D.   DATE OF BIRTH:  03-05-1930   DATE OF ADMISSION:  12/08/2005  DATE OF DISCHARGE:                                HISTORY & PHYSICAL   PRIMARY CARDIOLOGIST:  Dr. Juanito Doom   PRIMARY CARE PHYSICIAN:  Dr. Dianna Limbo   [name] is a 77 year old Caucasian gentleman known to Dr. Juanito Doom,  history of coronary artery disease status post drug-eluting stent to the LAD  in 2004 with diffuse residual disease, 80% small ramus and 60% right  coronary artery.  Patient has done well since that time.  Resumed his normal  exercise routine, swimming.  Has been very busy through Christmas.  Has not  had any problems with chest discomfort prior to today around 1:30 p.m. after  a lunch of shrimp.  Patient noticed he became slightly nauseated and  experienced some substernal chest discomfort.  He rated it around a 3 on a  scale of 1-10.  He took one nitroglycerin without relief, 20 minutes later  took another nitroglycerin, laid down in the recliner.  Chest pain  eventually eased off.  It lasted approximately 45 minutes.  He states during  that time his arms also went numb and he just felt funny all over.  He  decided to come in for further evaluation.  Currently he is resting  comfortably on the emergency room stretcher without complaints of chest  pain.  Previous work-up includes a stress Myoview post cardiac  catheterization and stress test was done in May of 2006, I believe at which  time he had an EF of 69%, normal wall motion without any ischemia.   PAST MEDICAL HISTORY:  1.  GERD.  2.  Coronary artery disease as stated above.  3.  Kidney stones.  4.  Anemia.  5.  Hyperlipidemia.   PAST SURGICAL HISTORY:  1.  Prostatectomy.  2.  Status post total left knee.   ALLERGIES:   DEMEROL.   MEDICATIONS:  1.  Aspirin 325 mg daily.  2.  Lipitor 10 mg.  3.  Nexium daily.   SOCIAL HISTORY:  He lives in [place] with his wife.  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  Denies any tobacco, ETOH, drug, or herbal medication use.  He tries to follow a heart healthy diet.  He does swim; however, he states  he has not been able to swim regularly since around November.   FAMILY HISTORY:  Mother deceased age 75.  No known coronary artery disease.  Father deceased age 32 secondary to a CVA.  No siblings with coronary artery  disease.   REVIEW OF SYSTEMS:  Positive for chest pain as described above, occasional  GERD symptoms typically relieved with Nexium.  All other symptoms negative  per patient.   PHYSICAL EXAMINATION:  VITAL SIGNS:  Temperature 96.7, pulse 74 and regular,  respirations 18, blood pressure 128/60.  He  is saturating 98% on room air.  GENERAL:  He is very pleasant, no acute distress.  SKIN:  Warm and dry.  Without rashes or lesions.  HEENT:  Pupils are equal, round, and reactive to light.  Sclera is clear.  Dentition okay.  NECK:  Supple without lymphadenopathy.  Negative bruit or JVD.  CARDIOVASCULAR:  Heart regular rate and rhythm.  S1 and S2.  Pulses are 2+  and equal without bruits.  LUNGS:  Clear to auscultation bilateral.  ABDOMEN:  Soft, nontender.  Positive bowel sounds.  Unable to palpate liver  margin.  EXTREMITIES:  Without clubbing, cyanosis, edema.  He has 2+ DPs bilateral.  NEUROLOGIC:  Alert and oriented x3.  Cranial nerves II-XII grossly intact.   Chest x-ray without any acute disease.  EKG at a rate of 68, sinus rhythm.  No obvious ST or T-wave changes.  Laboratory work showing a white blood cell  count 5.1, hemoglobin 14.3, hematocrit 41.4 with a platelet count of  222,000.  Sodium 142, potassium 3.9, BUN 17, creatinine 0.9 with a glucose  107.  AST of 20, ALT of 15.  Point of care markers initial set troponin  0.05.   1.  Dr. Juanito Doom in to examine and assess patient with unstable angina with      known coronary artery disease status post PCI to the LAD in July of 2004      with normal LV function positive for residual disease.  2.  Hyperlipidemia currently on a Statin.  3.  GERD currently on Nexium.   PLAN:  Admit patient.  Rule out an MI.  Plan for cardiac catheterization  tomorrow.  Continue IV nitroglycerin.  Anticoagulate with heparin.  Continue  aspirin.  Initiate a low dose beta blocker.  Continue his current Statin.      [name], NP      Jesse Sans. Wall, M.D.  Electronically Signed    MB/MEDQ  D:  12/08/2005  T:  12/09/2005  Job:  [id number]

## 2011-04-22 NOTE — H&P (Signed)
[address]. [facility]  Patient:    [name], [name]                         MRN: [id number] Adm. Date:  [id number] Disc. Date: [id number] Attending:  Twana First                         History and Physical  PREOPERATIVE DIAGNOSIS:  Right knee arthrofibrosis, status post total knee replacement.  POSTOPERATIVE DIAGNOSIS:  Right knee arthrofibrosis, status post total knee replacement.  PROCEDURE:  Right knee EUA, followed by manipulation under anesthesia and injection.  SURGEON:  Elana Alm. Thurston Hole, M.D.  ANESTHESIA:  General.  OPERATIVE TIME:  15 min.  COMPLICATIONS:  None.  INDICATIONS FOR PROCEDURE:  [name] is a 77 year old gentleman who underwent a right total knee replacement three and one-half months ago.  He has had persistent problems with lack of motion, arthrofibrosis and pain; is now to undergo examination under anesthesia and manipulation and injection.  DESCRIPTION OF PROCEDURE:  [name] was brought to the operating room on February 16, 2001, placed on the operating table in supine position.  After an adequate level of general anesthesia was obtained his right knee was examined under anesthesia.  He had range of motion from 0 to 85 degrees.  A general manipulation was carried out, breaking up the adhesions and improving flexion 120 degrees; with normal patellar tracking.  Intraoperative fluoroscopy confirmed that all components were in excellent position.  After this was done and the knee was sterilely injected with 2 cc Kenalog and 6 cc of 0.25% Marcaine with epinephrine.  After this was done then ice packs were applied. The patient was awakened and taken to the recovery room in stable condition.  FOLLOW-UP CARE:  [name] will be followed as an outpatient, on Vicodin for pain.  He will have a CPM machine at home and early aggressive physical therapy.  He is to see me back in my office in one week for recheck and follow-up. DD:   03/19/01 TD:  03/19/01 Job: [id number]

## 2011-04-22 NOTE — Discharge Summary (Signed)
[place]  Patient:    ANATOLE, APOLLO                         MRN: [id number] Adm. Date:  [id number] Disc. Date: 03/02/00 Attending:  Ranelle Oyster CC:         Leroy Sea., M.D.             Faith Rogue, M.D.             Robert A. Thurston Hole, M.D.                           Discharge Summary  DIAGNOSES:  1. Status post left total knee arthroplasty.  2. History of prostate cancer.  3. Anemia, requiring transfusion.  4. On Coumadin anticoagulation.  HISTORY OF PRESENT ILLNESS: The patient is a 77 year old male with a history of left patella fracture in 1998, who presented with progressive pain and deformity.  He elected to undergo left total knee arthroplasty with patella hardware removal on February 23, 2000 by Dr. Thurston Hole.  He was allowed weightbearing as tolerated and he was placed on Coumadin for DVT prophylaxis. He did have some chest pain and cardiac enzymes were negative.  The patient had anemia with hemoglobin down to 7.7, and had transfusion of two units.  HOSPITAL COURSE: It was felt that he would require inpatient rehabilitation prior to returning home, and therefore on February 25, 2000 he was admitted to the inpatient rehabilitation unit, where he has participated in greater than three hours per day of physical and occupational therapies.  He did have a low-grade fever and urine culture was sent, which did show 10,000 colonies of Staph species, which was treated with two days of Macrodantin before this was discontinued due to nausea.  On the patients most recent CBC from February 27, 2000 his hemoglobin is 9.6.  He has been maintained on Coumadin anticoagulation with a goal of 2.5 for his INR.  He is currently on Coumadin 4 mg.  At this point in time he seems a suitable candidate for discharge home as he is independent with bed mobility and transfers, and able to ambulate 150 feet with a rolling walker.  DISCHARGE MEDICATIONS:  1.  Coumadin until March 22, 2000.  2. Trinsicon b.i.d.  3. OxyContin CR 10 mg 2 q.12h for five days, then 1 q.12h for five days.  4. Pepcid 20 mg q.12h.  5. Flonase as before.  DISCHARGE ACTIVITY: Weightbearing as tolerated, with knee precautions.  He was instructed not to drive.  DISCHARGE DIET:  He was instructed to follow a regular diet.  WOUND CARE: He is asked to keep the wound clean and dry.  He may shower.  DISPOSITION: He will have home health physical therapy as well as a home health nurse to draw his blood work on Monday, March 06, 2000, and results will go to Dr. Scotty Court.  He is instructed to follow up with Dr. Thurston Hole in one week and Dr. Scotty Court as scheduled.  DISCHARGE CONDITION: Stable. DD:  03/01/00 TD:  03/02/00 Job: [id number]

## 2011-04-22 NOTE — Procedures (Signed)
[place]. [facility]  Patient:    [name], [name]                         MRN: [id number] Proc. Date: 02/23/00 Adm. Date:  [id number] Attending:  Twana First CC:         Anesthesia Department                           Procedure Report  PREOPERATIVE DIAGNOSIS:  Osteoarthritis of the knee.  OPERATION:  Total knee replacement performed by Dr. Elana Alm. Wainer.  ANESTHESIA PROCEDURE:  Placement of epidural catheter for postoperative analgesia.  INDICATIONS:  Preoperatively the risks and benefits for the placement of an epidural catheter were discussed in detail with the patient, including alternatives for pain control. Additionally, the patients attending surgeon requested that we place the epidural for his postoperative pain management.  The patient consented to the placement of the epidural catheter for postoperative analgesia.  DESCRIPTION OF PROCEDURE:  At the end of the operative procedure the patient was turned to the left lateral decubitus position.  A sterile prep of the lumbar area was conductive.  Using a #70 gauge Tuohy needle, adjacent to the L2-3 interspace, the epidural space was contacted with a loss of resistance technique.  A catheter was threaded approximately 3.0 to 4.0 cm beyond the needle tip, and the needle as withdrawn.  After a negative aspiration for both heme and CSF, the catheter was  slowly and incrementally injected with a total of 7 cc of 0.25% Marcaine containing 100 mcg of fentanyl.  The catheter was secured in place with tape.  The patient was turned supine, extubated, and transferred to the post anesthesia care unit in stable condition.  DISPOSITION:  The patient will be followed daily by the department of anesthesiology for his postoperative analgesia via the epidural catheter. DD:  02/23/00 TD:  02/23/00 Job: [id number]

## 2011-04-22 NOTE — Discharge Summary (Signed)
NAMEANDERSON, MIDDLEBROOKS                            ACCOUNT NO.:  [id number]   MEDICAL RECORD NO.:  [id number]                   PATIENT TYPE:  INP   LOCATION:  6525                                 FACILITY:  [facility]   PHYSICIAN:  Jesse Sans. Hughes, M.D.                DATE OF BIRTH:  Aug 26, 1930   DATE OF ADMISSION:  07/03/2003  DATE OF DISCHARGE:  07/04/2003                           DISCHARGE SUMMARY - REFERRING   HISTORY OF PRESENT ILLNESS:  This is a 77 year old male with no known  previous history of coronary artery disease, who was admitted to [facility]  [facility] after he had nausea and substernal chest pain.   PAST MEDICAL HISTORY:  1. Significant for gastroesophageal reflux disease.  2. History of anemia.  3. History of nephrolithiasis.  4. History of a detached retina.  5. History of prostate surgery.  6. History of chronic sinusitis.  7. He is status post right total knee replacement in 2002.  8. Left total knee replacement in 2001.   ALLERGIES:  DEMEROL, and he avoids nonsteroidal anti-inflammatory drugs,  secondary to GI side effects.   MEDICATIONS PRIOR TO ADMISSION:  1. Nexium.  2. Hydrocodone.   SOCIAL HISTORY:  The patient lives in [place] with his wife.  He does not  use tobacco.  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   FAMILY HISTORY:  His mother died at age 62 from natural causes.  His father  died at age 52 from a CVA.  He has a sister who is living, who does not have  coronary artery disease.   HOSPITAL COURSE:  As noted, this patient was admitted to [facility] for the further evaluation of chest pain associated with  nausea.  He underwent a cardiac catheterization on July 03, 2003.  He was  found to have diffuse coronary artery disease with a 95% LAD stenosis.  He  underwent stenting of the LAD, resulting in no residual stenosis.  Continued  medical management was recommended for his residual disease.  It was also  recommended that he have a stress test in approximately eight weeks.   DISPOSITION:  The patient was seen by Dr. Jesse Sans. Hughes on the following  day.  Arrangements were made to discharge him in a stable and improved  condition.   LABORATORY DATA:  A CBC on the day of discharge revealed a hemoglobin of  12.2, hematocrit 35.4, WBC 7,200, platelets 157,000.  Cardiac enzymes were  negative.  A chemistry profile on July 29th revealed a BUN 24, creatinine  0.9, potassium 4.0, glucose mildly elevated at 133.  A chest x-ray on July 29th showed no active disease.  An electrocardiogram showed sinus bradycardia, rate 56 beats per minute with  T-wave inversions in V2 through V4.    DISCHARGE MEDICATIONS:  1. Plavix 75 mg daily for one year.  2. Lipitor 20  mg each evening.  3. Coated aspirin 325 mg daily.  4. Nexium as previously taken.  5. Hydrocodone as previously taken.  6. Toprol XL 50 mg daily.  7. Nitroglycerin p.r.n. chest pain.   INSTRUCTIONS:  The patient was told to avoid any strenuous activity or  driving for at least two days.  He was told not to lift more than 10 pounds  for one week.  He is to be on a low-salt, low-fat diet.  He is told to call  the office if he has any increased, pain, swelling, or bleeding from his  groin.   FOLLOW UP:  He is to have a lipid profile with his next office visit.  He is  to follow up with Dr. Daleen Squibb in approximately two weeks.  The office will call  him for an appointment.  He is to Hughes Dr. Tawny Asal as needed or  as scheduled.   DISCHARGE DIAGNOSES:  1. Status post percutaneous coronary angiography and stent of the left     anterior descending coronary artery, performed on July 03, 2003, with     diffuse residual disease.  Please Hughes the cardiac catheterization report     for full details.  An exercise tolerance test is recommended in     approximately eight weeks.  The ejection fraction was 65%.  2. Lipid status unknown.  Empiric  treatment with Lipitor initiated this     admission.  3. History of gastroesophageal reflux disease.  4. History of anemia.  5. History of nephrolithiasis.  6. Status post multiple surgeries.         [name], P.A. [facility]                  [name], M.D.    DR/MEDQ  D:  07/04/2003  T:  07/04/2003  Job:  [id number]   cc:   Ellin Saba., M.D.  [address] [place]  [place] [address]  Fax: [phone]

## 2011-04-22 NOTE — Op Note (Signed)
[place]. [facility]  Patient:    WATSON, ROBARGE                         MRN: [id number] Proc. Date: 02/23/00 Adm. Date:  [id number] Attending:  Twana First                           Operative Report  PREOPERATIVE DIAGNOSES: 1. Left knee degenerative joint disease. 2. Retained hardware, left patella.  POSTOPERATIVE DIAGNOSES: 1. Left knee degenerative joint disease. 2. Retained hardware, left patella.  PROCEDURE: 1. Left total knee replacement using Osteonics Scorpio total knee system with #9    cemented femur, #9 cemented tibia with 12-mm polyethylene tibial spacer and    28-mm polyethylene cemented patella. 2. Removal of hardware, left patella. 3. Left knee lateral retinacular release.  SURGEON:  Elana Alm. Thurston Hole, M.D.  ASSISTANT:  Kirstin Adelberger, P.A.  ANESTHESIA:  General.  OPERATIVE TIME:  One hour and 45 minutes.  COMPLICATIONS:  None.  DESCRIPTION OF PROCEDURE:  Mr. Klarich was brought to the operating room on February 23, 2000 and placed on the operating table in supine position.  After an adequate level of general anesthesia was obtained, his left knee was examined under anesthesia.  Range of motion was -3 to 125 degrees.  Moderate varus deformity.  Knee stable to varus, valgus, anterior and posterior stress, with normal patellar tracking.  He received Ancef 1 g IV preoperatively for prophylaxis.  He had a Foley catheter placed under sterile condition.  After this was done, the leg was prepped using sterile Betadine and draped using sterile technique.  Leg was exsanguinated and a thigh tourniquet elevated to 350 mm.  Initially, through a 20-cm longitudinal incision, initial exposure was made and then all the subcutaneous tissues were incised in line with the skin incision.  The two lateral patellar screws were exposed and removed without complications.  The patellar fracture, which he had had years ago, was found to be  well-healed.  A median arthrotomy was performed, revealing an excessive amount of normal-appearing joint fluid.  The articular surfaces showed grade 4 changes medially and grade 3 and 4 changes in the patellofemoral joint and laterally.  The medial and lateral meniscal remnants were removed as well as the anterior cruciate ligament.  Osteophytes were removed off the femoral condyles as well as the patella as well as the proximal tibia. After this was done, an intramedullary drill was drilled up the femoral canal for placement of the distal femoral cutting jig, which was then placed in the appropriate amount of rotation and a distal 12-mm cut was made.  The distal femur was then sized, a #9 was felt to be the appropriate size, and then a #9 cutting jig was placed, hammered into its position and the anterior, posterior and chamfer uts were made.  After this was done, the proximal tibia was exposed.  The tibial spines were removed with an oscillating saw.  The proximal tibia was sized; a #9 was felt to be the appropriate size.  An intramedullary drill drilled down the tibial canal for placement of the proximal tibial cutting jig, which was then placed in the appropriate amount of rotation, and a 6-mm cut was made off the medial or lower  side.  After this was done and the cut surface was removed, the Scorpio PCL-sacrificing cutter was placed back on the femur and the box  cuts were made.  After this was done, the femoral trial was placed, hammered in position and found to be an excellent fit.  The tibial trial was placed with a 12-mm polyethylene spacer, which was found to be the appropriate size, restoring stability and overall normal alignment, range of motion to 0 to 120 degrees, with no lift-off on the tray.  The tray was marked for rotation and then Keel cut was made.  After this was done, the patella was sized, a 28-mm size was felt to be the appropriate size and a recessed  10 x 28-mm cut was made and three locking holes placed.  After this was done, it was felt that all the trial components were of excellent size, fit and  stability.  The knee was then jet-lavage irrigated with 3 L of saline solution.  The proximal tibial cut surface was exposed and the tibial base plate with cement backing, #9, was hammered onto the tibia with excellent fit, with excess cement  being removed from around the edges.  The femoral component with cement backing was hammered into position, also with excellent fit, with excess cement being removed from around the edges.  The 12-mm polyethylene tibial spacer was then hammered n and locked on the tibial base plate.  The knee was then taken through a range of motion, 0 to 120 degrees, no lift-off on the tray and excellent stability.  A patellar button was then placed into its recessed hole and held there with a clamp and excess cement being removed from around the edges.  After this was done and the cement hardened, patellofemoral tracking was evaluated.  This was still somewhat tight and thus the lateral retinacular release was carried out, restoring patellar tracking to normal.  The patellar tendon had been slightly detached by about 20% and thus, two drill holes were placed in the tibial tubercle and #2 ______ suture placed through this and tied down over the patellar tendon, thus securing this 0% detachment.  At this point, it was felt that all the components were of excellent size, fit and stability.  The arthrotomy was closed with #2 ______ suture over wo medium Hemovac drains.  Subcutaneous tissue was closed with 0 and 2-0 Vicryl and skin closed with skin staples.  Sterile dressings were applied.  The Hemovac was injected with 0.25% Marcaine with epinephrine and then clamped; the patient then turned lateral where he had an epidural catheter placed by Dr. Burna Forts of anesthesia for postoperative pain  control.  He was then awakened and taken to the recovery room in stable condition.  Needle and sponge count was correct x 2 at he end of the case.DD:  02/23/00 TD:  02/24/00 Job: [id number]

## 2011-04-22 NOTE — Discharge Summary (Signed)
NAMEBRENTIN, [name] NO.:  [id number]   MEDICAL RECORD NO.:  [id number]          PATIENT TYPE:  INP   LOCATION:  6522                         FACILITY:  [facility]   PHYSICIAN:  [name], M.D.     DATE OF BIRTH:  March 22, 1930   DATE OF ADMISSION:  12/08/2005  DATE OF DISCHARGE:  12/09/2005                                 DISCHARGE SUMMARY   PRIMARY CARE PHYSICIAN:  Tawny Asal, M.D.   PRIMARY CARDIOLOGIST:  Jesse Sans. Wall, M.D.   PRINCIPAL DIAGNOSIS:  Unstable angina/acute coronary syndrome.   OTHER DIAGNOSES:  1.  Coronary artery disease, status post PCI and stenting of the LAD in 2004      with drug-eluting stent.  2.  Gastroesophageal reflux disease.  3.  Hyperlipidemia.  4.  Anemia.  5.  Nephrolithiasis.  6.  Status post prostatectomy.  7.  Status post left total knee replacement.   ALLERGIES:  DEMEROL.   PROCEDURE:  Left heart cardiac catheterization.   HISTORY OF PRESENT ILLNESS:  A 77 year old white male with prior history of  CAD, status post PCI and stenting of the LAD in 2004 with drug-eluting stent  placement. He was noted at that time to have residual ramus and moderate  right coronary artery disease. He had done well since that time and has been  active exercising and swimming and was in his usual state of health until  December 08, 2005, when around 1:30 p.m. after eating lunch he became slightly  nauseated with 3/10 substernal chest discomfort unrelieved by sublingual  nitroglycerin after approximately 20 minutes. The symptoms all in all lasted  45 minutes and resolved after two sublingual nitroglycerins. He then  presented to the [facility] ED for further evaluation. In the emergency room  he was noted to have mild elevation of troponin at 0.05 and EKG showed sinus  rhythm with no obvious ST-T changes. The patient was admitted for further  evaluation.   HOSPITAL COURSE:  [name] CK and MB remained negative with elevation of  troponin to a peak of 0.36.  He underwent left heart cardiac catheterization  on December 09, 2005, which revealed patent stent in the LAD with a 60% to 70%  ostial stenosis of the second diagonal which was jailed by previously placed  stent. The circumflex had a 30% proximal stenosis while the RCA had diffuse  40% stenosis in the mid section of the artery. The EF was 45% with  anteroapical hypokinesis. It was felt that he could continue to be managed  with medical therapy and post catheterization has been ambulating without  recurrent chest discomfort or limitations. He is being discharged home today  in satisfactory condition.   DISCHARGE LABORATORY:  Hemoglobin 13.7, hematocrit 39.4, WBC 5.7, platelet  count 217,000. Sodium 143, potassium 4.1, chloride 109, CO2 29, BUN 17,  creatinine 1.1, glucose 76, total bilirubin 0.7, alkaline phosphatase 65,  AST 18, ALT 12, total protein 5.5, albumin 3.3, calcium 9.2.  Peak CK 76,  peak MB 2.6, peak troponin-I 0.36. Total cholesterol 138, triglycerides 70,  HDL 43,  LDL 81.   DISPOSITION:  The patient is being discharged home in good condition.   FOLLOWUP PLANS AND APPOINTMENTS:  He has a follow-up appointment with Dr.  Vern Claude nurse practitioner or PA on December 30, 2004, at 10:30 a.m. He is  asked to follow up with Dr. Scotty Court in three to four weeks.   DISCHARGE MEDICATIONS:  1.  Aspirin 81 mg daily.  2.  Lipitor 10 mg daily.  3.  Nexium 40 mg daily.  4.  Calcium one tablet b.i.d.  5.  Nitroglycerin 0.4 mg sublingual p.r.n. chest pain.  6.  Lopressor 25 mg half tab b.i.d.   OUTSTANDING LABORATORY STUDIES:  None.   DURATION OF DISCHARGE ENCOUNTER:  40 minutes including physician time.      Ok Anis, NP    ______________________________  [name], M.D.    CRB/MEDQ  D:  12/09/2005  T:  12/09/2005  Job:  [id number]   cc:   Ellin Saba., M.D.  [address] [place]  [place] [address]   Jesse Sans. Wall,  M.D.  [address]  [address]  [place]  [place] [address]

## 2011-05-14 ENCOUNTER — Other Ambulatory Visit: Payer: Self-pay | Admitting: Cardiology

## 2011-05-17 ENCOUNTER — Other Ambulatory Visit: Payer: Self-pay

## 2011-05-17 MED ORDER — HYDROCODONE-ACETAMINOPHEN 10-325 MG PO TABS: ORAL_TABLET | ORAL | Status: DC

## 2011-05-17 NOTE — Telephone Encounter (Signed)
rx sent to pharmacy

## 2011-06-02 ENCOUNTER — Other Ambulatory Visit: Payer: Self-pay | Admitting: Family Medicine

## 2011-07-20 ENCOUNTER — Ambulatory Visit (INDEPENDENT_AMBULATORY_CARE_PROVIDER_SITE_OTHER): Payer: Medicare Other | Admitting: Cardiology

## 2011-07-20 ENCOUNTER — Ambulatory Visit: Payer: Medicare Other | Admitting: Family Medicine

## 2011-07-20 ENCOUNTER — Encounter: Payer: Self-pay | Admitting: Cardiology

## 2011-07-20 VITALS — BP 125/69 | HR 60 | Resp 14 | Ht 71.0 in | Wt 202.0 lb

## 2011-07-20 DIAGNOSIS — E785 Hyperlipidemia, unspecified: Secondary | ICD-10-CM

## 2011-07-20 DIAGNOSIS — I2510 Atherosclerotic heart disease of native coronary artery without angina pectoris: Secondary | ICD-10-CM

## 2011-07-20 NOTE — Assessment & Plan Note (Signed)
Stable. Continue medical therapy. Follow up in one year 

## 2011-07-20 NOTE — Progress Notes (Signed)
HPI [name] returns today for evaluation management of his coronary disease. He is having no symptoms of angina. He is in a really good mood but is really worried about the future of health care.  He is compliant with his medications. As reviewed.  EKG shows normal sinus rhythm with a prominent R wave in V2. No acute changes. Past Medical History  Diagnosis Date  . CAD (coronary artery disease)   . Hyperlipidemia   . COPD (chronic obstructive pulmonary disease)   . Costochondritis   . GERD (gastroesophageal reflux disease)   . Osteoarthritis   . Acute pharyngitis   . Acute bronchitis   . Rhinitis     Bacterial  . Anemia   . Hypothyroidism   . Fatigue   . Diabetes mellitus   . Hiatal hernia   . Hemorrhoids   . History of renal calculi   . Diverticulosis of colon   . Adenomatous colon polyp   . Dermatitis   . Arthritis   . History of prostate cancer   . Migraines     Past Surgical History  Procedure Date  . Total knee arthroplasty 2001/2002  . Lithotripsy 1996  . Colonoscopy 2005  . Prostatectomy     Family History  Problem Relation Age of Onset  . Hypertension Other   . Arthritis Other   . Cancer Other     History   Social History  . Marital Status: Married    Spouse Name: N/A    Number of Children: N/A  . Years of Education: N/A   Occupational History  . Retired    Social History Main Topics  . Smoking status: Former Games developer  . Smokeless tobacco: Not on file  . Alcohol Use: Not on file  . Drug Use: No  . Sexually Active: Not on file   Other Topics Concern  . Not on file   Social History Narrative   No regular exercise    Allergies  Allergen Reactions  . Meperidine Hcl     REACTION: cobative    Current Outpatient Prescriptions  Medication Sig Dispense Refill  . aspirin 81 MG tablet Take 81 mg by mouth daily.        . Calcium Carbonate-Vitamin D (CALTRATE 600+D) 600-400 MG-UNIT per tablet Take 1 tablet by mouth 2 (two) times daily.         . Cholecalciferol (VITAMIN D3) 400 UNITS CAPS Take 1 tablet by mouth 2 (two) times daily.        Marland Kitchen HYDROcodone-acetaminophen (NORCO) 10-325 MG per tablet Take 1/2 tablet every 5 hours as needed.  60 tablet  5  . metoprolol tartrate (LOPRESSOR) 25 MG tablet 1/2 tab po bid       . Multiple Vitamins-Minerals (OCUVITE PRESERVISION) TABS Take 1 tablet by mouth 2 (two) times daily.        . pravastatin (PRAVACHOL) 20 MG tablet TAKE 1 TABLET AT BEDTIME  30 tablet  6  . PRILOSEC OTC 20 MG tablet TAKE 1 TO 2 TABLETS EVERY DAY  42 tablet  5  . pseudoephedrine (SUDAFED) 30 MG tablet Take 30 mg by mouth every 4 (four) hours as needed.         ROS Negative other than HPI.   PE General Appearance: well developed, well nourished in no acute distress, overweight,HEENT: symmetrical face, PERRLA, good dentition  Neck: no JVD, thyromegaly, or adenopathy, trachea midline Chest: symmetric without deformity Cardiac: PMI non-displaced, RRR, normal S1, S2, no gallop or murmur  Lung: clear to ausculation and percussion Vascular: all pulses full without bruits  Abdominal: nondistended, nontender, good bowel sounds, no HSM, no bruits Extremities: no cyanosis, clubbing or edema, no sign of DVT, no varicosities  Skin: normal color, no rashes Neuro: alert and oriented x 3, non-focal Pysch: normal affect Filed Vitals:   07/20/11 1527  BP: 125/69  Pulse: 60  Resp: 14  Height: 5\' 11"  (1.803 m)  Weight: 202 lb (91.627 kg)    EKG  Labs and Studies Reviewed.   Lab Results  Component Value Date   WBC 5.6 07/02/2008   HGB 14.0 09/29/2008   HCT 35.7* 07/02/2008   MCV 84.4 07/02/2008   PLT 203 07/02/2008      Chemistry      Component Value Date/Time   NA 144 01/07/2011 0852   K 4.4 01/07/2011 0852   CL 108 01/07/2011 0852   CO2 30 01/07/2011 0852   BUN 19 01/07/2011 0852   CREATININE 1.0 01/07/2011 0852      Component Value Date/Time   CALCIUM 9.1 01/07/2011 0852   ALKPHOS 71 01/07/2011 0852   AST 18 01/07/2011 0852     ALT 14 01/07/2011 0852   BILITOT 0.6 01/07/2011 0852       Lab Results  Component Value Date   CHOL 133 01/07/2011   CHOL 172 11/04/2010   CHOL 173 05/18/2010   Lab Results  Component Value Date   HDL 42.30 01/07/2011   HDL 38.50* 11/04/2010   HDL 39.70 05/18/2010   Lab Results  Component Value Date   LDLCALC 69 01/07/2011   LDLCALC 105* 11/04/2010   LDLCALC 109* 05/18/2010   Lab Results  Component Value Date   TRIG 109.0 01/07/2011   TRIG 141.0 11/04/2010   TRIG 122.0 05/18/2010   Lab Results  Component Value Date   CHOLHDL 3 01/07/2011   CHOLHDL 4 11/04/2010   CHOLHDL 4 05/18/2010   No results found for this basename: HGBA1C   Lab Results  Component Value Date   ALT 14 01/07/2011   AST 18 01/07/2011   ALKPHOS 71 01/07/2011   BILITOT 0.6 01/07/2011   Lab Results  Component Value Date   TSH 1.12 07/02/2008

## 2011-07-20 NOTE — Assessment & Plan Note (Signed)
Lipids reviewed. At goal. No change in therapy.

## 2011-07-20 NOTE — Patient Instructions (Signed)
Your physician recommends that you schedule a follow-up appointment in: 1 year with Dr. Wall  

## 2011-08-05 ENCOUNTER — Other Ambulatory Visit: Payer: Self-pay | Admitting: *Deleted

## 2011-08-05 MED ORDER — PRAVASTATIN SODIUM 20 MG PO TABS: 20.0000 mg | ORAL_TABLET | Freq: Every day | ORAL | Status: DC

## 2011-08-05 MED ORDER — METOPROLOL TARTRATE 25 MG PO TABS: ORAL_TABLET | ORAL | Status: DC

## 2011-08-31 LAB — URINALYSIS, ROUTINE W REFLEX MICROSCOPIC
Bilirubin Urine: NEGATIVE
Hgb urine dipstick: NEGATIVE
Ketones, ur: NEGATIVE
Protein, ur: NEGATIVE
Urobilinogen, UA: 0.2

## 2011-08-31 LAB — BASIC METABOLIC PANEL
BUN: 19
Chloride: 107
Creatinine, Ser: 0.93
GFR calc non Af Amer: >60

## 2011-08-31 LAB — CBC
MCV: 87.2
RBC: 4.76
WBC: 5

## 2011-08-31 LAB — TYPE AND SCREEN
ABO/RH(D): AB POS
Antibody Screen: NEGATIVE

## 2011-08-31 LAB — DIFFERENTIAL
Eosinophils Absolute: 0.3
Lymphocytes Relative: 22
Lymphs Abs: 1.1
Monocytes Relative: 10
Neutrophils Relative %: 62

## 2011-08-31 LAB — APTT: aPTT: 36

## 2011-08-31 LAB — PROTIME-INR: INR: 1

## 2011-09-01 LAB — POCT I-STAT, CHEM 8
Creatinine, Ser: 1.1
HCT: 35 — ABNORMAL LOW
Hemoglobin: 11.9 — ABNORMAL LOW
Potassium: 4.2
Sodium: 139
TCO2: 26

## 2011-09-01 LAB — B-NATRIURETIC PEPTIDE (CONVERTED LAB): Pro B Natriuretic peptide (BNP): <30

## 2011-09-01 LAB — CBC
Hemoglobin: 9.3 — ABNORMAL LOW
MCHC: 34
MCV: 87.7
MCV: 85.9
Platelets: 458 — ABNORMAL HIGH
RBC: 3.12 — ABNORMAL LOW
RBC: 3.4 — ABNORMAL LOW
RDW: 13.5
RDW: 13.6
WBC: 7.6

## 2011-09-01 LAB — URINALYSIS, ROUTINE W REFLEX MICROSCOPIC
Glucose, UA: NEGATIVE
Ketones, ur: NEGATIVE
Leukocytes, UA: NEGATIVE
Nitrite: NEGATIVE
Specific Gravity, Urine: 1.019
pH: 8

## 2011-09-01 LAB — BASIC METABOLIC PANEL
BUN: 19
CO2: 28
Calcium: 8 — ABNORMAL LOW
Calcium: 8.2 — ABNORMAL LOW
Chloride: 106
Creatinine, Ser: 0.85
Creatinine, Ser: 0.94
GFR calc Af Amer: >60
GFR calc non Af Amer: >60
Glucose, Bld: 123 — ABNORMAL HIGH
Sodium: 135

## 2011-09-01 LAB — PROTIME-INR
INR: 1
Prothrombin Time: 13.4

## 2011-09-01 LAB — URINE MICROSCOPIC-ADD ON

## 2011-09-01 LAB — DIFFERENTIAL
Eosinophils Absolute: 0.2
Lymphocytes Relative: 12
Lymphs Abs: 0.9
Monocytes Relative: 9
Neutrophils Relative %: 76

## 2011-09-01 LAB — D-DIMER, QUANTITATIVE: D-Dimer, Quant: 2.89 — ABNORMAL HIGH

## 2011-09-01 LAB — URINE CULTURE

## 2011-09-01 LAB — APTT: aPTT: 32

## 2011-09-01 LAB — POCT CARDIAC MARKERS
CKMB, poc: 1.5
Myoglobin, poc: 77
Operator id: 294591

## 2011-09-12 LAB — DIFFERENTIAL
Basophils Relative: 0
Eosinophils Absolute: 0 — ABNORMAL LOW
Neutro Abs: 13.9 — ABNORMAL HIGH
Neutrophils Relative %: 95 — ABNORMAL HIGH

## 2011-09-12 LAB — CBC
MCHC: 34.7
MCV: 86.4
Platelets: 198
WBC: 14.6 — ABNORMAL HIGH

## 2011-09-12 LAB — BASIC METABOLIC PANEL
BUN: 27 — ABNORMAL HIGH
Calcium: 9.2
Chloride: 106
Creatinine, Ser: 1.06
GFR calc Af Amer: >60

## 2011-09-26 ENCOUNTER — Encounter (INDEPENDENT_AMBULATORY_CARE_PROVIDER_SITE_OTHER): Payer: Medicare Other | Admitting: Ophthalmology

## 2011-09-26 DIAGNOSIS — H3530 Unspecified macular degeneration: Secondary | ICD-10-CM

## 2011-09-26 DIAGNOSIS — H33009 Unspecified retinal detachment with retinal break, unspecified eye: Secondary | ICD-10-CM

## 2011-09-26 DIAGNOSIS — H43819 Vitreous degeneration, unspecified eye: Secondary | ICD-10-CM

## 2011-12-13 ENCOUNTER — Telehealth: Payer: Self-pay | Admitting: *Deleted

## 2011-12-13 NOTE — Telephone Encounter (Signed)
Refill on hydrocodone apap LOV 03/17/10- Dr. Scotty Court NOV 02/15/12- new to establish with Dr. Fabian Sharp

## 2011-12-14 NOTE — Telephone Encounter (Signed)
Pt also need prilosec 20 mg otc call into [facility] [place]

## 2011-12-14 NOTE — Telephone Encounter (Signed)
Patient has not been seen by dr Scotty Court since 4 11 . Please ask reason /dx for medication use  And how often he is taking it   and then I will  refill meds.

## 2011-12-14 NOTE — Telephone Encounter (Signed)
Left message to call back  

## 2011-12-16 MED ORDER — HYDROCODONE-ACETAMINOPHEN 10-325 MG PO TABS: ORAL_TABLET | ORAL | Status: DC

## 2011-12-16 MED ORDER — OMEPRAZOLE MAGNESIUM 20 MG PO TBEC: DELAYED_RELEASE_TABLET | ORAL | Status: DC

## 2011-12-16 NOTE — Telephone Encounter (Signed)
Left message to call back  

## 2011-12-16 NOTE — Telephone Encounter (Signed)
Pt is taking it for arthritis in his knee. Pt takes 2 day of the hydrocodone.   Per Dr. Clent Ridges okay to do #60 and no refills.  Rx sent to pharmacy.

## 2012-01-16 ENCOUNTER — Telehealth: Payer: Self-pay | Admitting: Family Medicine

## 2012-01-16 NOTE — Telephone Encounter (Signed)
Looks like this is a pt of Dr. Fabian Sharp

## 2012-01-16 NOTE — Telephone Encounter (Signed)
Refill request for Norco 10-325 mg take 1/2 tablet q 5 hrs prn.

## 2012-01-17 MED ORDER — HYDROCODONE-ACETAMINOPHEN 10-325 MG PO TABS: ORAL_TABLET | ORAL | Status: DC

## 2012-01-17 NOTE — Telephone Encounter (Signed)
Addended by: Aniceto Boss A on: 01/17/2012 05:14 PM   Modules accepted: Orders

## 2012-01-17 NOTE — Telephone Encounter (Signed)
Script called in

## 2012-01-17 NOTE — Telephone Encounter (Signed)
Refill x1  #60 

## 2012-01-17 NOTE — Telephone Encounter (Signed)
Pt states he take 2 tabs daily.

## 2012-02-15 ENCOUNTER — Ambulatory Visit (INDEPENDENT_AMBULATORY_CARE_PROVIDER_SITE_OTHER): Payer: Medicare Other | Admitting: Internal Medicine

## 2012-02-15 ENCOUNTER — Encounter: Payer: Self-pay | Admitting: Internal Medicine

## 2012-02-15 VITALS — BP 130/80 | HR 91 | Ht 68.0 in | Wt 196.0 lb

## 2012-02-15 DIAGNOSIS — I2510 Atherosclerotic heart disease of native coronary artery without angina pectoris: Secondary | ICD-10-CM

## 2012-02-15 DIAGNOSIS — E785 Hyperlipidemia, unspecified: Secondary | ICD-10-CM

## 2012-02-15 DIAGNOSIS — K219 Gastro-esophageal reflux disease without esophagitis: Secondary | ICD-10-CM

## 2012-02-15 DIAGNOSIS — Z8546 Personal history of malignant neoplasm of prostate: Secondary | ICD-10-CM

## 2012-02-15 DIAGNOSIS — F1110 Opioid abuse, uncomplicated: Secondary | ICD-10-CM

## 2012-02-15 DIAGNOSIS — H3530 Unspecified macular degeneration: Secondary | ICD-10-CM

## 2012-02-15 DIAGNOSIS — M129 Arthropathy, unspecified: Secondary | ICD-10-CM

## 2012-02-15 DIAGNOSIS — Z974 Presence of external hearing-aid: Secondary | ICD-10-CM

## 2012-02-15 DIAGNOSIS — F1190 Opioid use, unspecified, uncomplicated: Secondary | ICD-10-CM

## 2012-02-15 DIAGNOSIS — M1990 Unspecified osteoarthritis, unspecified site: Secondary | ICD-10-CM

## 2012-02-15 DIAGNOSIS — D649 Anemia, unspecified: Secondary | ICD-10-CM

## 2012-02-15 LAB — CBC WITH DIFFERENTIAL/PLATELET
Basophils Absolute: 0 10*3/uL (ref 0.0–0.1)
Basophils Relative: 0.3 % (ref 0.0–3.0)
Eosinophils Absolute: 0.3 10*3/uL (ref 0.0–0.7)
HCT: 43.3 % (ref 39.0–52.0)
Hemoglobin: 14.2 g/dL (ref 13.0–17.0)
Lymphs Abs: 1.4 10*3/uL (ref 0.7–4.0)
MCHC: 32.8 g/dL (ref 30.0–36.0)
MCV: 89 fl (ref 78.0–100.0)
Neutro Abs: 4.2 10*3/uL (ref 1.4–7.7)
RBC: 4.86 Mil/uL (ref 4.22–5.81)
RDW: 13.3 % (ref 11.5–14.6)

## 2012-02-15 LAB — LIPID PANEL
Cholesterol: 131 mg/dL (ref 0–200)
HDL: 45.6 mg/dL (ref >39.00)
VLDL: 36.8 mg/dL (ref 0.0–40.0)

## 2012-02-15 LAB — TSH: TSH: 1.13 u[IU]/mL (ref 0.35–5.50)

## 2012-02-15 LAB — HEPATIC FUNCTION PANEL
Albumin: 3.9 g/dL (ref 3.5–5.2)
Alkaline Phosphatase: 68 U/L (ref 39–117)
Total Protein: 6.5 g/dL (ref 6.0–8.3)

## 2012-02-15 LAB — BASIC METABOLIC PANEL
CO2: 29 mEq/L (ref 19–32)
Chloride: 105 mEq/L (ref 96–112)
Glucose, Bld: 99 mg/dL (ref 70–99)
Potassium: 4.2 mEq/L (ref 3.5–5.1)
Sodium: 142 mEq/L (ref 135–145)

## 2012-02-15 MED ORDER — OMEPRAZOLE MAGNESIUM 20 MG PO TBEC: DELAYED_RELEASE_TABLET | ORAL | Status: DC

## 2012-02-15 MED ORDER — HYDROCODONE-ACETAMINOPHEN 10-325 MG PO TABS: ORAL_TABLET | ORAL | Status: DC

## 2012-02-15 NOTE — Patient Instructions (Addendum)
Continue healthy lifestyle and needing call for refills of medicine Care with narcotic pain medicines to avoid falling and mental fogginess. Continue exercise and healthy habits small amounts of food frequently are better for reflux. Try to have your last caffeine beverages at least 6 hours before bedtime to optimize sleep. Will inform you when laboratory tests are back.  Recheck office visit in 6 months or as needed.

## 2012-02-15 NOTE — Progress Notes (Signed)
Subjective:    Patient ID: [name], male    DOB: 10-27-1930, 78 y.o.   MRN: [id number]  HPI Patient comes in today as a new patient visit/ transfer from Dr. Scotty Court who has since retired. He has ongoing problems that include coronary artery disease that is staple COPD GERD arthritis history of prostate cancer Had stent 3 arteries   Last  2004 but has been stable since then by report  Prostate cancer in 1990 and radiation and  surgery. Wet macular degeneration."  Fired last doctor.  Pain management  1/2  Narcotic  4 x per and since   Had gi issue  to nasids    Denies falling or bleeding GI;Omeprezole once a day and prn.   For 8 years.  orig from [place].   Sleep problematic at times  Review of Systems Neg cp sob  New gi or gu issues   Wears hearing aids  No ha  Depression  recen syncope cough fever sweats  Rest of hpi as  above.  Past history family history social history reviewed an updated as best possible  in the electronic medical record.  Outpatient Prescriptions Prior to Visit  Medication Sig Dispense Refill  . aspirin 81 MG tablet Take 81 mg by mouth daily. 2 times a week      . Calcium Carbonate-Vitamin D (CALTRATE 600+D) 600-400 MG-UNIT per tablet Take 1 tablet by mouth 2 (two) times daily.        . Cholecalciferol (VITAMIN D3) 400 UNITS CAPS Take 1 tablet by mouth 2 (two) times daily.        . metoprolol tartrate (LOPRESSOR) 25 MG tablet 1/2 tab po bid  90 tablet  3  . Multiple Vitamins-Minerals (OCUVITE PRESERVISION) TABS Take 1 tablet by mouth 2 (two) times daily.        . pravastatin (PRAVACHOL) 20 MG tablet Take 1 tablet (20 mg total) by mouth daily.  90 tablet  3  . pseudoephedrine (SUDAFED) 30 MG tablet Take 30 mg by mouth every 4 (four) hours as needed.       Marland Kitchen HYDROcodone-acetaminophen (NORCO) 10-325 MG per tablet Take 1/2 tablet every 5 hours as needed.  60 tablet  1  . omeprazole (PRILOSEC OTC) 20 MG tablet Take 1-2 tabs daily  42 tablet  3       Objective:     Physical Exam WDWN in nad  Oriented x 3 and no noted deficits in memory, attention, and speech. Walks with cane  But good balance and affect  HEENT no focal changes  Has hearing aids   Op tongue midline  Ear clear  Neck: Supple without adenopathy or masses or bruits Chest:  Clear to A&P without wheezes rales or rhonchi CV:  S1-S2 no gallops or murmurs peripheral perfusion is normal Abdomen:  Sof,t normal bowel sounds without hepatosplenomegaly, no guarding rebound or masses no CVA tenderness No clubbing cyanosis or edema OA djd changes no swelling  Neuro no focal except hearing aids     Assessment & Plan:  CAD  Stable  Hx of prostate cancer  No recurrence sees [facility] GERD ?  On daily ppi   Apparently gets sx if goes off.  Disc risk benefit  DJD  chronic narcotic therapy x years .    Risk benefit of medication discussed. And prefer he not be on this but has had no falling  And cannot take nsaids cause of gi issues and other risk.  HCM declined pneumovax  Plan labs as he is due for this  and fu.  Total visit 40 mins > 50% spent counseling and coordinating care

## 2012-02-19 ENCOUNTER — Encounter: Payer: Self-pay | Admitting: Internal Medicine

## 2012-02-19 DIAGNOSIS — H3530 Unspecified macular degeneration: Secondary | ICD-10-CM | POA: Insufficient documentation

## 2012-02-19 DIAGNOSIS — F1190 Opioid use, unspecified, uncomplicated: Secondary | ICD-10-CM | POA: Insufficient documentation

## 2012-02-19 DIAGNOSIS — Z974 Presence of external hearing-aid: Secondary | ICD-10-CM | POA: Insufficient documentation

## 2012-02-22 ENCOUNTER — Encounter: Payer: Self-pay | Admitting: *Deleted

## 2012-02-22 NOTE — Progress Notes (Signed)
Quick Note:    Letter sent to pt.  ______

## 2012-02-25 ENCOUNTER — Other Ambulatory Visit: Payer: Self-pay

## 2012-02-25 ENCOUNTER — Inpatient Hospital Stay (HOSPITAL_COMMUNITY)
Admission: EM | Admit: 2012-02-25 | Discharge: 2012-02-26 | DRG: 313 | Disposition: A | Discharge status: Home / Self Care | Payer: Medicare Other | Attending: Internal Medicine | Admitting: Internal Medicine

## 2012-02-25 ENCOUNTER — Emergency Department (HOSPITAL_COMMUNITY): Payer: Medicare Other

## 2012-02-25 ENCOUNTER — Encounter (HOSPITAL_COMMUNITY): Payer: Self-pay | Admitting: *Deleted

## 2012-02-25 DIAGNOSIS — I2510 Atherosclerotic heart disease of native coronary artery without angina pectoris: Secondary | ICD-10-CM | POA: Diagnosis present

## 2012-02-25 DIAGNOSIS — R079 Chest pain, unspecified: Secondary | ICD-10-CM | POA: Diagnosis present

## 2012-02-25 DIAGNOSIS — Z8546 Personal history of malignant neoplasm of prostate: Secondary | ICD-10-CM

## 2012-02-25 DIAGNOSIS — J9819 Other pulmonary collapse: Secondary | ICD-10-CM | POA: Diagnosis present

## 2012-02-25 DIAGNOSIS — Z87442 Personal history of urinary calculi: Secondary | ICD-10-CM

## 2012-02-25 DIAGNOSIS — R0789 Other chest pain: Principal | ICD-10-CM | POA: Diagnosis present

## 2012-02-25 DIAGNOSIS — R7989 Other specified abnormal findings of blood chemistry: Secondary | ICD-10-CM | POA: Diagnosis present

## 2012-02-25 DIAGNOSIS — Z87891 Personal history of nicotine dependence: Secondary | ICD-10-CM

## 2012-02-25 DIAGNOSIS — Z79899 Other long term (current) drug therapy: Secondary | ICD-10-CM

## 2012-02-25 DIAGNOSIS — Z9861 Coronary angioplasty status: Secondary | ICD-10-CM

## 2012-02-25 DIAGNOSIS — Z7982 Long term (current) use of aspirin: Secondary | ICD-10-CM

## 2012-02-25 DIAGNOSIS — Z886 Allergy status to analgesic agent status: Secondary | ICD-10-CM

## 2012-02-25 DIAGNOSIS — Z860100 Personal history of colon polyps, unspecified: Secondary | ICD-10-CM

## 2012-02-25 LAB — POCT I-STAT TROPONIN I
Troponin i, poc: 0 ng/mL (ref 0.00–0.08)
Troponin i, poc: 0.01 ng/mL (ref 0.00–0.08)

## 2012-02-25 LAB — COMPREHENSIVE METABOLIC PANEL
AST: 136 U/L — ABNORMAL HIGH (ref 0–37)
Albumin: 3.5 g/dL (ref 3.5–5.2)
Alkaline Phosphatase: 73 U/L (ref 39–117)
Chloride: 106 mEq/L (ref 96–112)
Potassium: 3.9 mEq/L (ref 3.5–5.1)
Sodium: 143 mEq/L (ref 135–145)
Total Bilirubin: 0.7 mg/dL (ref 0.3–1.2)

## 2012-02-25 LAB — CBC
Hemoglobin: 13.2 g/dL (ref 13.0–17.0)
Platelets: 136 10*3/uL — ABNORMAL LOW (ref 150–400)
RBC: 4.56 MIL/uL (ref 4.22–5.81)
RDW: 13.1 % (ref 11.5–15.5)
WBC: 12 10*3/uL — ABNORMAL HIGH (ref 4.0–10.5)

## 2012-02-25 LAB — CARDIAC PANEL(CRET KIN+CKTOT+MB+TROPI)
CK, MB: 2.1 ng/mL (ref 0.3–4.0)
Relative Index: INVALID (ref 0.0–2.5)
Total CK: 39 U/L (ref 7–232)
Troponin I: <0.3 ng/mL (ref <0.30)

## 2012-02-25 LAB — URINALYSIS, ROUTINE W REFLEX MICROSCOPIC
Glucose, UA: NEGATIVE mg/dL
Hgb urine dipstick: NEGATIVE
Leukocytes, UA: NEGATIVE
Specific Gravity, Urine: 1.025 (ref 1.005–1.030)
Urobilinogen, UA: 0.2 mg/dL (ref 0.0–1.0)

## 2012-02-25 LAB — CREATININE, SERUM
Creatinine, Ser: 1.09 mg/dL (ref 0.50–1.35)
GFR calc non Af Amer: 63 mL/min — ABNORMAL LOW (ref >90)

## 2012-02-25 MED ORDER — ENOXAPARIN SODIUM 40 MG/0.4ML ~~LOC~~ SOLN
40.0000 mg | SUBCUTANEOUS | Status: DC
  Administered 2012-02-25: 40 mg via SUBCUTANEOUS
  Filled 2012-02-25 (×2): qty 0.4

## 2012-02-25 MED ORDER — ASPIRIN EC 81 MG PO TBEC
81.0000 mg | DELAYED_RELEASE_TABLET | Freq: Every day | ORAL | Status: DC
  Administered 2012-02-26: 81 mg via ORAL
  Filled 2012-02-25: qty 1

## 2012-02-25 MED ORDER — ACETAMINOPHEN 325 MG PO TABS: 650.0000 mg | ORAL_TABLET | ORAL | Status: DC | PRN

## 2012-02-25 MED ORDER — ONDANSETRON HCL 4 MG/2ML IJ SOLN: 4.0000 mg | Freq: Four times a day (QID) | INTRAMUSCULAR | Status: DC | PRN

## 2012-02-25 MED ORDER — NITROGLYCERIN 0.4 MG SL SUBL: 0.4000 mg | SUBLINGUAL_TABLET | SUBLINGUAL | Status: DC | PRN

## 2012-02-25 MED ORDER — HYDROCODONE-ACETAMINOPHEN 5-325 MG PO TABS
0.5000 | ORAL_TABLET | Freq: Four times a day (QID) | ORAL | Status: DC | PRN
  Administered 2012-02-25 – 2012-02-26 (×2): 0.5 via ORAL
  Filled 2012-02-25 (×2): qty 1

## 2012-02-25 MED ORDER — OCUVITE-LUTEIN PO CAPS
1.0000 | ORAL_CAPSULE | Freq: Two times a day (BID) | ORAL | Status: DC
  Administered 2012-02-25 – 2012-02-26 (×2): 1 via ORAL
  Filled 2012-02-25 (×3): qty 1

## 2012-02-25 MED ORDER — SODIUM CHLORIDE 0.9 % IV SOLN
1000.0000 mL | INTRAVENOUS | Status: DC
  Administered 2012-02-25 – 2012-02-26 (×2): 1000 mL via INTRAVENOUS

## 2012-02-25 MED ORDER — METOPROLOL TARTRATE 12.5 MG HALF TABLET
12.5000 mg | ORAL_TABLET | Freq: Two times a day (BID) | ORAL | Status: DC
  Administered 2012-02-25 – 2012-02-26 (×2): 12.5 mg via ORAL
  Filled 2012-02-25 (×3): qty 1

## 2012-02-25 MED ORDER — ASPIRIN 81 MG PO CHEW: 324.0000 mg | CHEWABLE_TABLET | ORAL | Status: AC

## 2012-02-25 MED ORDER — ASPIRIN 325 MG PO TABS: 325.0000 mg | ORAL_TABLET | Freq: Once | ORAL | Status: DC

## 2012-02-25 MED ORDER — SIMVASTATIN 20 MG PO TABS
10.0000 mg | ORAL_TABLET | Freq: Every day | ORAL | Status: DC
  Filled 2012-02-25: qty 0.5

## 2012-02-25 MED ORDER — SODIUM CHLORIDE 0.9 % IV SOLN: INTRAVENOUS | Status: AC

## 2012-02-25 MED ORDER — CHOLECALCIFEROL 10 MCG (400 UNIT) PO TABS
400.0000 [IU] | ORAL_TABLET | Freq: Two times a day (BID) | ORAL | Status: DC
  Administered 2012-02-25 – 2012-02-26 (×2): 400 [IU] via ORAL
  Filled 2012-02-25 (×3): qty 1

## 2012-02-25 MED ORDER — SIMVASTATIN 10 MG PO TABS
10.0000 mg | ORAL_TABLET | Freq: Every day | ORAL | Status: DC
  Administered 2012-02-25: 10 mg via ORAL
  Filled 2012-02-25 (×2): qty 1

## 2012-02-25 MED ORDER — OCUVITE PRESERVISION PO TABS: 1.0000 | ORAL_TABLET | Freq: Two times a day (BID) | ORAL | Status: DC

## 2012-02-25 MED ORDER — ASPIRIN 300 MG RE SUPP
300.0000 mg | RECTAL | Status: AC
  Filled 2012-02-25: qty 1

## 2012-02-25 MED ORDER — PANTOPRAZOLE SODIUM 40 MG PO TBEC
40.0000 mg | DELAYED_RELEASE_TABLET | Freq: Two times a day (BID) | ORAL | Status: DC
  Administered 2012-02-26: 40 mg via ORAL
  Filled 2012-02-25: qty 1

## 2012-02-25 MED ORDER — VITAMIN D3 10 MCG (400 UNIT) PO CAPS: 1.0000 | ORAL_CAPSULE | Freq: Two times a day (BID) | ORAL | Status: DC

## 2012-02-25 MED ORDER — HYDROCODONE-ACETAMINOPHEN 10-325 MG PO TABS: 0.2500 | ORAL_TABLET | Freq: Four times a day (QID) | ORAL | Status: DC | PRN

## 2012-02-25 MED ORDER — OMEPRAZOLE MAGNESIUM 20 MG PO TBEC: 20.0000 mg | DELAYED_RELEASE_TABLET | Freq: Two times a day (BID) | ORAL | Status: DC

## 2012-02-25 NOTE — ED Notes (Signed)
Awoke at 0500 felt nauseous, then had epigastric & CP. Denies upon arrival to ED. Took antiemetic, ASA 325mg  prior to EMS arrival.

## 2012-02-25 NOTE — ED Notes (Signed)
Informed patient and/or family of status. No voiced complaints presently. NAD. Remains pain free, NSR on monitor

## 2012-02-25 NOTE — ED Notes (Signed)
Pt knows that urine is needed 

## 2012-02-25 NOTE — ED Notes (Signed)
unable to call report, nurse unavailable

## 2012-02-25 NOTE — Consult Note (Signed)
Derrick Bottoms, MD, [facility] ABIM Board Certified in Adult Cardiovascular Medicine,Internal Medicine and Critical Care Medicine    CC: Atypical chest pain  Active Problems:  Chest pain  Coronary artery disease   HPI:  The patient is a 78 year old male followed by Dr. Elijah Birk wall in the office. He has a history of coronary artery disease with stent placement in 2004. I do not have those details available as they were not listed in the notes. The patient also tells me that every 2-3 years he has a stress test and had one done year ago which was within normal limits. The patient was a social event yesterday were several people at gastroenteritis or as he describes his dysentery. The patient actually was in his usual state of health until around 4:00 this morning when suddenly he felt some abdominal pain particularly upper epigastric, some associated nausea and vomiting and felt he had to go to the bathroom. He then suddenly developed substernal chest pain across the precordium without shortness of breath but with significant diaphoresis. The patient could not make a bowel movement at that time. His wife gave him some apple cider vinegar and soda as well as aspirin. Subsequently symptoms resolved. The patient was concerned about his symptoms and decided to come to the emergency room. His EKG was entirely within normal limits. There were no acute changes. First set of troponins was also within normal limits. The patient is now comfortable. He denies any nausea or vomiting fever or chills.   Past Medical History  Diagnosis Date  . CAD (coronary artery disease)   . Hyperlipidemia   . COPD (chronic obstructive pulmonary disease)   . Costochondritis   . GERD (gastroesophageal reflux disease)   . Osteoarthritis   . Acute pharyngitis   . Acute bronchitis   . Rhinitis     Bacterial  . Anemia   . Hypothyroidism   . Fatigue   . Diabetes mellitus   . Hiatal hernia   . Hemorrhoids   . History of renal  calculi   . Diverticulosis of colon   . Adenomatous colon polyp   . Dermatitis   . Arthritis   . History of prostate cancer   . Migraines   . Transfusion history     Current Facility-Administered Medications  Medication Dose Route Frequency Provider Last Rate Last Dose  . 0.9 %  sodium chloride infusion  1,000 mL Intravenous Continuous Carleene Cooper III, MD 125 mL/hr at 02/25/12 0929 1,000 mL at 02/25/12 0929  . 0.9 %  sodium chloride infusion   Intravenous STAT Carleene Cooper III, MD        Prescriptions prior to admission  Medication Sig Dispense Refill  . aspirin 325 MG tablet Take 325 mg by mouth once.      Marland Kitchen HYDROcodone-acetaminophen (NORCO) 10-325 MG per tablet Take 0.25 tablets by mouth every 6 (six) hours as needed. Takes 1/4th tablet For pain      . metoprolol tartrate (LOPRESSOR) 25 MG tablet 1/2 tab po bid  90 tablet  3  . Multiple Vitamins-Minerals (OCUVITE PRESERVISION) TABS Take 1 tablet by mouth 2 (two) times daily.        Marland Kitchen omeprazole (PRILOSEC OTC) 20 MG tablet Take 1-2 tabs daily  42 tablet  3  . ondansetron (ZOFRAN) 4 MG tablet Take 4 mg by mouth once. nausea      . OVER THE COUNTER MEDICATION Take 1 tablet by mouth 2 (two) times daily. Calcium Citrate 500mg   w/ Vitamin D 800mg       . pravastatin (PRAVACHOL) 20 MG tablet Take 1 tablet (20 mg total) by mouth daily.  90 tablet  3  . pseudoephedrine (SUDAFED) 30 MG tablet Take 30 mg by mouth every 4 (four) hours as needed. Nasal decongestant      . aspirin 81 MG tablet Take 81 mg by mouth daily. 2 times a week        Allergies  Allergen Reactions  . Aspirin     Daily use causes bleeding in the bladder.  Can take occasionally  . Meperidine Hcl     REACTION: combative    Family History  Problem Relation Age of Onset  . Hypertension Other   . Arthritis Other   . Cancer Other     History   Social History  . Marital Status: Married    Spouse Name: N/A    Number of Children: N/A  . Years of Education: N/A     Occupational History  . Retired    Social History Main Topics  . Smoking status: Former Games developer  . Smokeless tobacco: Not on file  . Alcohol Use: No  . Drug Use: No  . Sexually Active: Not on file   Other Topics Concern  . Not on file   Social History Narrative   No regular exerciseHHof 2   Married wife no pets Remote hx of tobacco stopped 1965No etohREtired Scientist, research (physical sciences) level.    LABS:  Results for orders placed during the hospital encounter of 02/25/12 (from the past 48 hour(s))  CBC     Status: Abnormal   Collection Time   02/25/12  7:46 AM      Component Value Range Comment   WBC 12.0 (*) 4.0 - 10.5 (K/uL)    RBC 4.67  4.22 - 5.81 (MIL/uL)    Hemoglobin 13.5  13.0 - 17.0 (g/dL)    HCT 60.4  54.0 - 98.1 (%)    MCV 86.5  78.0 - 100.0 (fL)    MCH 28.9  26.0 - 34.0 (pg)    MCHC 33.4  30.0 - 36.0 (g/dL)    RDW 19.1  47.8 - 29.5 (%)    Platelets 136 (*) 150 - 400 (K/uL)   COMPREHENSIVE METABOLIC PANEL     Status: Abnormal   Collection Time   02/25/12  7:46 AM      Component Value Range Comment   Sodium 143  135 - 145 (mEq/L)    Potassium 3.9  3.5 - 5.1 (mEq/L)    Chloride 106  96 - 112 (mEq/L)    CO2 29  19 - 32 (mEq/L)    Glucose, Bld 85  70 - 99 (mg/dL)    BUN 22  6 - 23 (mg/dL)    Creatinine, Ser 6.21  0.50 - 1.35 (mg/dL)    Calcium 9.4  8.4 - 10.5 (mg/dL)    Total Protein 6.1  6.0 - 8.3 (g/dL)    Albumin 3.5  3.5 - 5.2 (g/dL)    AST 308 (*) 0 - 37 (U/L)    ALT 70 (*) 0 - 53 (U/L)    Alkaline Phosphatase 73  39 - 117 (U/L)    Total Bilirubin 0.7  0.3 - 1.2 (mg/dL)    GFR calc non Af Amer 77 (*) >90 (mL/min)    GFR calc Af Amer 89 (*) >90 (mL/min)   PROTIME-INR     Status: Normal   Collection Time   02/25/12  7:46 AM      Component Value Range Comment   Prothrombin Time 14.3  11.6 - 15.2 (seconds)    INR 1.09  0.00 - 1.49    APTT     Status: Normal   Collection Time   02/25/12  7:46 AM      Component Value Range Comment   aPTT 31  24 - 37 (seconds)    URINALYSIS, ROUTINE W REFLEX MICROSCOPIC     Status: Abnormal   Collection Time   02/25/12  8:13 AM      Component Value Range Comment   Color, Urine YELLOW  YELLOW     APPearance CLEAR  CLEAR     Specific Gravity, Urine 1.025  1.005 - 1.030     pH 5.5  5.0 - 8.0     Glucose, UA NEGATIVE  NEGATIVE (mg/dL)    Hgb urine dipstick NEGATIVE  NEGATIVE     Bilirubin Urine NEGATIVE  NEGATIVE     Ketones, ur 15 (*) NEGATIVE (mg/dL)    Protein, ur NEGATIVE  NEGATIVE (mg/dL)    Urobilinogen, UA 0.2  0.0 - 1.0 (mg/dL)    Nitrite NEGATIVE  NEGATIVE     Leukocytes, UA NEGATIVE  NEGATIVE  MICROSCOPIC NOT DONE ON URINES WITH NEGATIVE PROTEIN, BLOOD, LEUKOCYTES, NITRITE, OR GLUCOSE <1000 mg/dL.  POCT I-STAT TROPONIN I     Status: Normal   Collection Time   02/25/12  8:22 AM      Component Value Range Comment   Troponin i, poc 0.00  0.00 - 0.08 (ng/mL)    Comment 3            POCT I-STAT TROPONIN I     Status: Normal   Collection Time   02/25/12 10:53 AM      Component Value Range Comment   Troponin i, poc 0.00  0.00 - 0.08 (ng/mL)    Comment 3            POCT I-STAT TROPONIN I     Status: Normal   Collection Time   02/25/12  1:52 PM      Component Value Range Comment   Troponin i, poc 0.01  0.00 - 0.08 (ng/mL)    Comment 3              RADIOLOGY: Dg Chest Portable 1 View  02/25/2012  *RADIOLOGY REPORT*  Clinical Data: Chest pain, nausea  PORTABLE CHEST - 1 VIEW  Comparison: 05/28/2008; 05/01/2008; chest CT - 05/28/2008  Findings:  Grossly unchanged cardiac silhouette and mediastinal contours given decreased lung volumes with bilateral perihilar heterogeneous opacities favored to represent atelectasis.  Unchanged granuloma within the right mid lung.  No pneumothorax or pleural effusion. Unchanged bones.  IMPRESSION: 1.  No acute cardiopulmonary disease.  2.  Decreased lung volumes with perihilar opacities favored to represent atelectasis.  Original Report Authenticated By: Waynard Reeds, M.D.     PHYSICAL EXAM  Filed Vitals:   02/25/12 0959 02/25/12 1118 02/25/12 1200 02/25/12 1300  BP: 123/65 128/69 101/62 113/66  Pulse:   56 67  Resp: 18 18 23 14   SpO2: 97% 99% 96% 99%   BP 113/66  Pulse 67  Resp 14  SpO2 99% No intake or output data in the 24 hours ending 02/25/12 1539   General: Well-nourished white male in no apparent distress. HEENT: Normal carotid upstroke no carotid bruits. No thyromegaly nonnodular thyroid. JVP is 5 cm Cardiac: Regular rate and rhythm with normal S1-S2  no murmur rubs or gallops. Lungs: Clear breath sounds bilaterally with no wheezing Abdomen: Some epigastric tenderness on palpation no rebound or guarding otherwise the bowel sounds Extremities. No cyanosis clubbing or edema. Normal distal pulses Skin: Warm and dry Psychologic: Normal affect  Neurologic: Alert and oriented and grossly nonfocal.  TELEMETRY: Reviewed telemetry pt in normal sinus rhythm Normal electrocardiogram Atelectasis by chest x-ray  ASSESSMENT AND PLAN:  1. 2 3 4 5  Chest pain -atypical with negative cardiac troponins a point-of-care and negative EKG Atelectasis  Elevated liver function tests: AST ALT  Coronary artery disease status post PCI stent 2004 no details provided in office notes  Rule out gastroenteritis     PLAN   I do not suspect the patient has an acute coronary syndrome. He has had a stress test approximately a year ago. I could not find these results in the chart however. He has a rather acute presentation with somewhat atypical features. Initial point-of-care testing was negative and we will obtain serial enzymes and an echocardiogram. If within normal limits the patient will likely be discharged home tomorrow. I suspect also that in the next 24 hours he may present with additional symptoms of gastroenteritis. He will need followup liver function tests in the morning although this may point to a viral gastroenteritis. The patient however is also  statin drug therapy and should be monitored in the future. If cardiac enzymes are positive certainly catheterization can be considered. I doubt this will be necessary.   Alvin Critchley [facility] 02/25/2012, 3:39 PM

## 2012-02-25 NOTE — ED Provider Notes (Cosign Needed)
History     CSN: [id number]  Arrival date & time 02/25/12  0712   First MD Initiated Contact with Patient 02/25/12 [phone]      Chief Complaint  Patient presents with  . 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 This morning he woke up around 4:45 AM with a feeling of nausea. He took a nitroglycerin pill if it got worse. There was no diarrhea no vomiting around 5 he developed a pain in his chest which lasted about an hour with associated sweating. His wife gave him a concoction of the finger, soda, and water, as well as an aspirin 325 mg. Now his pain is resolved. He was brought to the [facility] via EMS. He has a history of prior coronary artery disease with cardiac stenting. He is followed for this by [facility].  Patient is a 78 y.o. male presenting with chest pain. The history is provided by the patient, the spouse and medical records. No language interpreter was used.  Chest Pain The chest pain began 3 - 5 hours ago. Duration of episode(s) is 1 hour. Chest pain occurs constantly. The chest pain is resolved. Associated with: Nausea, sweating. The severity of the pain is moderate. The quality of the pain is described as burning. The pain does not radiate (Pain was felt in the lower sternal region and in the epigastrium.). Exacerbated by: Nothing. Primary symptoms include nausea. Pertinent negatives for primary symptoms include no palpitations and no vomiting.  Associated symptoms include diaphoresis. He tried aspirin (Vinegar, sodium bicarbonate, and water.) for the symptoms. Risk factors include being elderly and male gender (Known coronary artery disease with prior stenting.).  His past medical history is significant for CAD, COPD, diabetes and hyperlipidemia.  Procedure history  is positive for cardiac catheterization.     Past Medical History  Diagnosis Date  . CAD (coronary artery disease)   . Hyperlipidemia   . COPD (chronic obstructive pulmonary disease)   . Costochondritis   . GERD (gastroesophageal reflux disease)   . Osteoarthritis   . Acute pharyngitis   . Acute bronchitis   . Rhinitis     Bacterial  . Anemia   . Hypothyroidism   . Fatigue   . Diabetes mellitus   . Hiatal hernia   . Hemorrhoids   . History of renal calculi   . Diverticulosis of colon   . Adenomatous colon polyp   . Dermatitis   . Arthritis   . History of prostate cancer   . Migraines   . Transfusion history     Past Surgical History  Procedure Date  . Total knee arthroplasty 2001/2002  . Lithotripsy 1996  . Colonoscopy 2005  . Prostatectomy   . Appendectomy   . Retinal detachment surgery   . Broken wrist   . Radiation for prostate   . Broken knee cap   . Right knee replacement     x 2  . Left knee replacement   . Stint     Family History  Problem Relation Age of Onset  . Hypertension Other   . Arthritis Other   . Cancer Other     History  Substance Use Topics  . Smoking status: Former Games developer  . Smokeless tobacco: Not on file  . Alcohol Use: No      Review  of Systems  Constitutional: Positive for diaphoresis.  HENT: Negative.   Eyes: Negative.   Respiratory: Negative.   Cardiovascular: Positive for chest pain. Negative for palpitations and leg swelling.  Gastrointestinal: Positive for nausea. Negative for vomiting and diarrhea.  Genitourinary: Negative.   Musculoskeletal: Negative.   Skin:       Sweating.  Neurological: Negative.   Psychiatric/Behavioral: Negative.     Allergies  Aspirin and Meperidine hcl  Home Medications   Current Outpatient Rx  Name Route Sig Dispense Refill  . ASPIRIN 325 MG PO TABS Oral Take 325 mg by mouth once.    Marland Kitchen HYDROCODONE-ACETAMINOPHEN 10-325 MG PO TABS Oral Take 0.25 tablets by mouth every 6 (six)  hours as needed. Takes 1/4th tablet For pain    . METOPROLOL TARTRATE 25 MG PO TABS  1/2 tab po bid 90 tablet 3  . OCUVITE PRESERVISION PO TABS Oral Take 1 tablet by mouth 2 (two) times daily.      Marland Kitchen OMEPRAZOLE MAGNESIUM 20 MG PO TBEC  Take 1-2 tabs daily 42 tablet 3  . ONDANSETRON HCL 4 MG PO TABS Oral Take 4 mg by mouth once. nausea    . OVER THE COUNTER MEDICATION Oral Take 1 tablet by mouth 2 (two) times daily. Calcium Citrate 500mg  w/ Vitamin D 800mg     . PRAVASTATIN SODIUM 20 MG PO TABS Oral Take 1 tablet (20 mg total) by mouth daily. 90 tablet 3  . PSEUDOEPHEDRINE HCL 30 MG PO TABS Oral Take 30 mg by mouth every 4 (four) hours as needed. Nasal decongestant    . ASPIRIN 81 MG PO TABS Oral Take 81 mg by mouth daily. 2 times a week      BP 137/54  Pulse 59  Resp 18  SpO2 99%  Physical Exam  Constitutional: He is oriented to person, place, and time. He appears well-developed and well-nourished. No distress.  HENT:  Head: Normocephalic and atraumatic.  Right Ear: External ear normal.  Left Ear: External ear normal.  Mouth/Throat: Oropharynx is clear and moist.  Eyes: Conjunctivae and EOM are normal. Pupils are equal, round, and reactive to light.  Neck: Normal range of motion. Neck supple.  Cardiovascular: Normal rate, regular rhythm and normal heart sounds.   Pulmonary/Chest: Effort normal and breath sounds normal.  Abdominal: Soft. Bowel sounds are normal.  Musculoskeletal: Normal range of motion. He exhibits no edema and no tenderness.  Neurological: He is alert and oriented to person, place, and time.       Sensory or motor deficits.  Skin: Skin is warm and dry.  Psychiatric: He has a normal mood and affect. His behavior is normal.    ED Course  Procedures (including critical care time)   Labs Reviewed  CBC  COMPREHENSIVE METABOLIC PANEL  PROTIME-INR  APTT  URINALYSIS, ROUTINE W REFLEX MICROSCOPIC     Date: 02/25/2012  Rate: 58  Rhythm: normal sinus rhythm   QRS Axis: normal  Intervals: normal QRS:  Early R wave transition in precordial leads.  ST/T Wave abnormalities: normal  Conduction Disutrbances:none  Narrative Interpretation: Essentially normal EKG  Old EKG Reviewed: unchanged  7:51 AM Patient was seen and had physical examination. Laboratory tests and x-rays were ordered IV fluids and oxygen were ordered. Patient is not currently in chest pain. Old charts were reviewed.  9:30 AM Lab workup showed a negative EKG and initial troponin value. The patient remains asymptomatic. I contacted Sherryl Manges M.D., [facility]  cardiologist. [facility] cardiology will see and  admit the patient.   1. Chest pain       1:02 PM Call to Dr. Graciela Husbands --> can send pt to the telemetry floor; temporary orders written.     Carleene Cooper III, MD 02/25/12 0981  Carleene Cooper III, MD 02/25/12 1302

## 2012-02-25 NOTE — ED Notes (Signed)
Reports awoke with nausea, no emesis then had epigastric/SSCP non radiating & diaphoretic. Denied SOB. Wife gave zofran, concoction of vinegar & soda for nausea & ASA 325mg . Pain free upon EMS arrival. States pain lasted approx 1 hr, described it as "a heaviness" & that pain felt different from past when he had stents placed. Denied diarrhea.  Presently with no voiced complaints. NAD

## 2012-02-26 ENCOUNTER — Other Ambulatory Visit: Payer: Self-pay

## 2012-02-26 ENCOUNTER — Encounter (HOSPITAL_COMMUNITY): Payer: Self-pay | Admitting: Physician Assistant

## 2012-02-26 DIAGNOSIS — I219 Acute myocardial infarction, unspecified: Secondary | ICD-10-CM

## 2012-02-26 LAB — CARDIAC PANEL(CRET KIN+CKTOT+MB+TROPI)
CK, MB: 2.1 ng/mL (ref 0.3–4.0)
Troponin I: <0.3 ng/mL (ref <0.30)

## 2012-02-26 LAB — CBC
Hemoglobin: 12.8 g/dL — ABNORMAL LOW (ref 13.0–17.0)
MCH: 29.1 pg (ref 26.0–34.0)
MCV: 86.6 fL (ref 78.0–100.0)
RBC: 4.4 MIL/uL (ref 4.22–5.81)

## 2012-02-26 LAB — HEPATIC FUNCTION PANEL
AST: 38 U/L — ABNORMAL HIGH (ref 0–37)
Albumin: 3.2 g/dL — ABNORMAL LOW (ref 3.5–5.2)
Bilirubin, Direct: <0.1 mg/dL (ref 0.0–0.3)

## 2012-02-26 LAB — LIPID PANEL
LDL Cholesterol: 51 mg/dL (ref 0–99)
VLDL: 19 mg/dL (ref 0–40)

## 2012-02-26 LAB — BASIC METABOLIC PANEL
CO2: 28 mEq/L (ref 19–32)
Calcium: 9 mg/dL (ref 8.4–10.5)
Creatinine, Ser: 0.94 mg/dL (ref 0.50–1.35)
Glucose, Bld: 95 mg/dL (ref 70–99)

## 2012-02-26 MED ORDER — NITROGLYCERIN 0.4 MG SL SUBL: 0.4000 mg | SUBLINGUAL_TABLET | SUBLINGUAL | Status: AC | PRN

## 2012-02-26 NOTE — Progress Notes (Signed)
Derrick Bottoms, MD, [facility] ABIM Board Certified in Adult Cardiovascular Medicine,Internal Medicine and Critical Care Medicine    SUBJECTIVE:  Much improved, No recurrent atypical CP anymore. No GI symptoms anymore. One more set troponins pending. Feels good.  Active Problems:  Chest pain-atypical ? GI related.  Coronary artery disease- s/p stent 2004   Past Medical History  Diagnosis Date  . CAD (coronary artery disease)   . Hyperlipidemia   . COPD (chronic obstructive pulmonary disease)   . Costochondritis   . GERD (gastroesophageal reflux disease)   . Osteoarthritis   . Acute pharyngitis   . Acute bronchitis   . Rhinitis     Bacterial  . Anemia   . Hypothyroidism   . Fatigue   . Diabetes mellitus   . Hiatal hernia   . Hemorrhoids   . History of renal calculi   . Diverticulosis of colon   . Adenomatous colon polyp   . Dermatitis   . Arthritis   . History of prostate cancer   . Migraines   . Transfusion history     Current Facility-Administered Medications  Medication Dose Route Frequency Provider Last Rate Last Dose  . 0.9 %  sodium chloride infusion  1,000 mL Intravenous Continuous Derrick Cooper III, MD 125 mL/hr at 02/26/12 0055 1,000 mL at 02/26/12 0055  . 0.9 %  sodium chloride infusion   Intravenous STAT Derrick Cooper III, MD      . 0.9 %  sodium chloride infusion   Intravenous Continuous June Leap, MD      . acetaminophen (TYLENOL) tablet 650 mg  650 mg Oral Q4H PRN June Leap, MD      . aspirin chewable tablet 324 mg  324 mg Oral NOW June Leap, MD       Or  . aspirin suppository 300 mg  300 mg Rectal NOW June Leap, MD      . aspirin EC tablet 81 mg  81 mg Oral Daily June Leap, MD      . cholecalciferol (VITAMIN D) tablet 400 Units  400 Units Oral BID Duke Salvia, MD   400 Units at 02/25/12 2209  . enoxaparin (LOVENOX) injection 40 mg  40 mg Subcutaneous Q24H June Leap, MD   40 mg at 02/25/12 1704  . HYDROcodone-acetaminophen  (NORCO) 5-325 MG per tablet 0.5 tablet  0.5 tablet Oral Q6H PRN Duke Salvia, MD   0.5 tablet at 02/25/12 2213  . metoprolol tartrate (LOPRESSOR) tablet 12.5 mg  12.5 mg Oral BID June Leap, MD   12.5 mg at 02/25/12 2209  . multivitamin-lutein (OCUVITE-LUTEIN) capsule 1 capsule  1 capsule Oral BID Duke Salvia, MD   1 capsule at 02/25/12 2209  . nitroGLYCERIN (NITROSTAT) SL tablet 0.4 mg  0.4 mg Sublingual Q5 Min x 3 PRN June Leap, MD      . ondansetron [facility]) injection 4 mg  4 mg Intravenous Q6H PRN June Leap, MD      . pantoprazole (PROTONIX) EC tablet 40 mg  40 mg Oral BID AC Duke Salvia, MD   40 mg at 02/26/12 1610  . simvastatin (ZOCOR) tablet 10 mg  10 mg Oral q1800 Duke Salvia, MD   10 mg at 02/25/12 1703  . DISCONTD: aspirin tablet 325 mg  325 mg Oral Once June Leap, MD      .  DISCONTD: HYDROcodone-acetaminophen (NORCO) 10-325 MG per tablet 0.5 tablet  0.5 tablet Oral Q6H PRN June Leap, MD      . DISCONTD: Idolina Primer PreserVision TABS 1 tablet  1 tablet Oral BID June Leap, MD      . DISCONTD: omeprazole (PRILOSEC OTC) EC tablet 20 mg  20 mg Oral BID AC June Leap, MD      . DISCONTD: simvastatin (ZOCOR) tablet 10 mg  10 mg Oral q1800 June Leap, MD      . DISCONTD: Vitamin D3 CAPS 1 tablet  1 tablet Oral BID June Leap, MD        LABS: Basic Metabolic Panel:  Basename 02/26/12 0640 02/25/12 1637 02/25/12 0746  NA 144 -- 143  K 3.7 -- 3.9  CL 110 -- 106  CO2 28 -- 29  GLUCOSE 95 -- 85  BUN 17 -- 22  CREATININE 0.94 1.09 --  CALCIUM 9.0 -- 9.4  MG -- -- --  PHOS -- -- --   Liver Function Tests:  [facility] 02/25/12 0746  AST 136*  ALT 70*  ALKPHOS 73  BILITOT 0.7  PROT 6.1  ALBUMIN 3.5   CBC:  Basename 02/26/12 0640 02/25/12 1637  WBC 6.3 6.0  NEUTROABS -- --  HGB 12.8* 13.2  HCT 38.1* 39.4  MCV 86.6 86.4  PLT 134* 158   Cardiac Enzymes:  Cardiac Panel (last 3 results)  Basename 02/25/12 1638  CKTOTAL 39  CKMB 2.1    TROPONINI <0.30  RELINDX RELATIVE INDEX IS INVALID    Basename 02/26/12 0640  CHOL 114  HDL 44  LDLCALC 51  TRIG 94  CHOLHDL 2.6  LDLDIRECT --     RADIOLOGY: Dg Chest Portable 1 View  02/25/2012  *RADIOLOGY REPORT*  Clinical Data: Chest pain, nausea  PORTABLE CHEST - 1 VIEW  Comparison: 05/28/2008; 05/01/2008; chest CT - 05/28/2008  Findings:  Grossly unchanged cardiac silhouette and mediastinal contours given decreased lung volumes with bilateral perihilar heterogeneous opacities favored to represent atelectasis.  Unchanged granuloma within the right mid lung.  No pneumothorax or pleural effusion. Unchanged bones.  IMPRESSION: 1.  No acute cardiopulmonary disease.  2.  Decreased lung volumes with perihilar opacities favored to represent atelectasis.  Original Report Authenticated By: Waynard Reeds, M.D.      PHYSICAL EXAM  Filed Vitals:   02/25/12 1554 02/25/12 1700 02/25/12 2011 02/26/12 0504  BP: 123/72  126/73 135/71  Pulse: 68  62 65  Temp: 97.1 F (36.2 C)  98.3 F (36.8 C) 98.7 F (37.1 C)  TempSrc: Oral  Oral Oral  Resp: 14  24 19   Height:  5\' 11"  (1.803 m)    Weight:  191 lb 9.6 oz (86.909 kg)  188 lb 15 oz (85.7 kg)  SpO2: 96%  95% 97%   BP 135/71  Pulse 65  Temp(Src) 98.7 F (37.1 C) (Oral)  Resp 19  Ht 5\' 11"  (1.803 m)  Wt 188 lb 15 oz (85.7 kg)  BMI 26.35 kg/m2  SpO2 97%  Intake/Output Summary (Last 24 hours) at 02/26/12 5284 Last data filed at 02/26/12 0809  Gross per 24 hour  Intake    480 ml  Output      0 ml  Net    480 ml   I/O last 3 completed shifts: In: 240 [P.O.:240] Out: -  General: well- nourished. No distress HEENT: normal carotid upstroke. Normal JVP. No thyromegaly Cardiac: RRR, NL  S1/S2. No pathologic murmurs Lungs: clear BS bilaterally, no wheezing. Abdomen, soft, non- tender Extremities. No edema. Normal distal pulses Skin: warm and dry Psychologic: normal affect.   TELEMETRY: Reviewed telemetry pt in  NSR  ASSESSMENT AND PLAN  1. Chest pain -atypical with negative cardiac troponins a point-of-care and negative EKG  2.Atelectasis  3.Elevated liver function tests: AST ALT  4.Coronary artery disease status post PCI stent 2004 no details provided in office notes  5.Rule out gastroenteritis    PLAN   One more set of enzymes pending, Suspect will be negative.  Add LFTs/transaminases to next set  Lipids excellent  SCP atypical in the setting of GI upset ? Spasm/reflux  Needs new script for PRN NTG @ home: expired.  If ECHO delayed set up f/u with TW with OP ECHO and F/U LFTs ? Related to statins- may need to decrease.    Alvin Critchley [facility] 02/26/2012, 8:22 AM

## 2012-02-26 NOTE — Discharge Summary (Signed)
Discharge Summary   Patient ID: [name] MRN: [id number], DOB/AGE: 1930-03-05 78 y.o. Admit date: 02/25/2012 D/C date:     02/26/2012    Primary Care Physician:  Dr. Fabian Sharp Primary Cardiologist:  Dr. Valera Castle  Primary Electrophysiologist:  None   Reason for Admission:  Chest Pain  Primary Discharge Diagnoses:  1.  Chest Pain - MI ruled out      For outpatient workup and follow up 2.  Elevated LFTs - ? Related to gastroenteritis   Secondary Discharge Diagnoses:   Past Medical History  Diagnosis Date  . CAD (coronary artery disease)     s/p Taxus DES to prox and mid LAD 06/2003;  LHC  1/07: LM 205, LAD stents ok., oD2 70% jailed (tx conservatively), mid to dist RCA 40-50%, EF 40-45%;  echo 2/07: EF 55-65%  . Hyperlipidemia   . COPD (chronic obstructive pulmonary disease)   . GERD (gastroesophageal reflux disease)   . Osteoarthritis   . Anemia   . Hypothyroidism   . Diabetes mellitus   . Hiatal hernia   . Hemorrhoids   . History of renal calculi   . Diverticulosis of colon   . Adenomatous colon polyp   . Dermatitis   . Arthritis   . History of prostate cancer   . Migraines   . Transfusion history      Procedures Performed This Admission:    Echocardiogram 02/26/12: Study Conclusions  - Left ventricle: The cavity size was normal. Systolic function was normal. The estimated ejection fraction was in the range of 55% to 60%. Wall motion was normal; there were no regional wall motion abnormalities. Left ventricular diastolic function parameters were normal. - Atrial septum: No defect or patent foramen ovale was identified. - Pulmonary arteries: PA peak pressure: 43mm Hg (S).   Hospital Course: [name] is a 78 y.o. male with a h/o CAD, s/p prior PCI who developed chest pain in the setting of nausea and vomiting.  He notes recent exposure to others with documented gastroenteritis.   He was concerned about his symptoms and came to the ED. Initial cardiac  markers were negative.  His LFTs were noted to be elevated.  He was observed over night on telemetry.  CXR was unremarkable.  Follow up LFTs were improved and it was felt this would need to be followed as an outpatient as he is on a statin.  As noted, an echo was normal. He was seen by Dr. Lewayne Bunting on the morning of discharge.  Final set of cardiac enzymes are currently pending.    If normal, he will be discharged to home with plans for an outpatient myoview and and follow up with Dr. Valera Castle.   Discharge Vitals: Blood pressure 144/77, pulse 57, temperature 97.8 F (36.6 C), temperature source Oral, resp. rate 18, height 5\' 11"  (1.803 m), weight 188 lb 15 oz (85.7 kg), SpO2 96.00%.  Labs: Lab Results  Component Value Date   WBC 6.3 02/26/2012   HGB 12.8* 02/26/2012   HCT 38.1* 02/26/2012   MCV 86.6 02/26/2012   PLT 134* 02/26/2012     Lab 02/26/12 1000 02/26/12 0640  NA -- 144  K -- 3.7  CL -- 110  CO2 -- 28  BUN -- 17  CREATININE -- 0.94  CALCIUM -- 9.0  PROT 5.9* --  BILITOT 0.5 --  ALKPHOS 74 --  ALT 54* --  AST 38* --  GLUCOSE -- 95   ALT  Date/Time  Value Range Status  02/26/2012 10:00 AM 54* 0-53 (U/L) Final  02/25/2012  7:46 AM 70* 0-53 (U/L) Final     AST  Date/Time Value Range Status  02/26/2012 10:00 AM 38* 0-37 (U/L) Final  02/25/2012  7:46 AM 136* 0-37 (U/L) Final    Basename 02/26/12 1339 02/25/12 1638  CKTOTAL 44 39  CKMB 2.1 2.1  TROPONINI <0.30 <0.30   Lab Results  Component Value Date   CHOL 114 02/26/2012   HDL 44 02/26/2012   LDLCALC 51 02/26/2012   TRIG 94 02/26/2012    Lab Results  Component Value Date   TSH 1.13 02/15/2012    Diagnostic Studies/Procedures:  Dg Chest Portable 1 View  02/25/2012   IMPRESSION: 1.  No acute cardiopulmonary disease.  2.  Decreased lung volumes with perihilar opacities favored to represent atelectasis.  Original Report Authenticated By: Waynard Reeds, M.D.    Discharge Medications   Medication List  As of  02/26/2012  2:52 PM   STOP taking these medications         aspirin 81 MG tablet      CALTRATE 600+D 600-400 MG-UNIT per tablet      ondansetron 4 MG tablet      Vitamin D3 400 UNITS Caps         TAKE these medications         aspirin 325 MG tablet   Take 325 mg by mouth once.      HYDROcodone-acetaminophen 10-325 MG per tablet   Commonly known as: NORCO   Take 0.25 tablets by mouth every 6 (six) hours as needed. Takes 1/4th tablet  For pain      metoprolol tartrate 25 MG tablet   Commonly known as: LOPRESSOR   1/2 tab po bid      nitroGLYCERIN 0.4 MG SL tablet   Commonly known as: NITROSTAT   Place 1 tablet (0.4 mg total) under the tongue every 5 (five) minutes x 3 doses as needed for chest pain.      Ocuvite PreserVision Tabs   Take 1 tablet by mouth 2 (two) times daily.      omeprazole 20 MG tablet   Commonly known as: PRILOSEC OTC   Take 1-2 tabs daily      OVER THE COUNTER MEDICATION   Take 1 tablet by mouth 2 (two) times daily. Calcium Citrate 500mg  w/ Vitamin D 800mg       pravastatin 20 MG tablet   Commonly known as: PRAVACHOL   Take 1 tablet (20 mg total) by mouth daily.      pseudoephedrine 30 MG tablet   Commonly known as: SUDAFED   Take 30 mg by mouth every 4 (four) hours as needed. Nasal decongestant            Disposition   The patient will be discharged in stable condition to home. Discharge Orders    Future Appointments: Provider: Department: Dept Phone: Center:   08/17/2012 10:30 AM Madelin Headings, MD [facility] [phone] [facility]     Future Orders Please Complete By Expires   Diet - low sodium heart healthy      Increase activity slowly        Follow-up Information    Follow up with Lorretta Harp, MD. Schedule an appointment as soon as possible for a visit in 2 weeks.      Follow up with Valera Castle, MD in 2 weeks. (office will call you)    Contact information:  [address] [place] [address] [phone]       Follow up with [facility] in 1 week. (office will call to arrange a stress test)    Contact information:   [address] [place] [address] [phone]           Duration of Discharge Encounter: Greater than 30 minutes including physician and PA time.  Signed, Tereso Newcomer, PA-C  2:52 PM 02/26/2012        [address] [place], [place]  [address] Phone: [phone] Fax:  [phone]

## 2012-02-26 NOTE — Progress Notes (Signed)
*  PRELIMINARY RESULTS* Echocardiogram 2D Echocardiogram has been performed.  [facility] 02/26/2012, 12:02 PM

## 2012-02-26 NOTE — Progress Notes (Signed)
02/26/2012 3:13 PM Nursing note Discharge avs form, rx and follow up appointments discussed and given to patient and wife. Medications already taken today and those due this evening also discussed. Correct use of sl ntg reviewed. D/c iv line. D/c tele. D/c home per orders.  Malorie Bigford, Blanchard Kelch

## 2012-03-27 ENCOUNTER — Telehealth: Payer: Self-pay | Admitting: *Deleted

## 2012-03-27 ENCOUNTER — Ambulatory Visit (INDEPENDENT_AMBULATORY_CARE_PROVIDER_SITE_OTHER): Payer: Medicare Other | Admitting: Physician Assistant

## 2012-03-27 ENCOUNTER — Encounter: Payer: Self-pay | Admitting: Physician Assistant

## 2012-03-27 DIAGNOSIS — R079 Chest pain, unspecified: Secondary | ICD-10-CM

## 2012-03-27 DIAGNOSIS — R7989 Other specified abnormal findings of blood chemistry: Secondary | ICD-10-CM

## 2012-03-27 DIAGNOSIS — E785 Hyperlipidemia, unspecified: Secondary | ICD-10-CM

## 2012-03-27 DIAGNOSIS — I2510 Atherosclerotic heart disease of native coronary artery without angina pectoris: Secondary | ICD-10-CM

## 2012-03-27 LAB — HEPATIC FUNCTION PANEL
Bilirubin, Direct: 0.1 mg/dL (ref 0.0–0.3)
Total Protein: 6.4 g/dL (ref 6.0–8.3)

## 2012-03-27 NOTE — Progress Notes (Signed)
Addended by: Tarri Fuller on: 03/27/2012 10:33 AM   Modules accepted: Orders

## 2012-03-27 NOTE — Patient Instructions (Addendum)
Your physician has requested that you have en exercise stress myoview DX CHEST PAIN. For further information please visit [email or web address] Please follow instruction sheet, as given.   Your physician recommends that you return for lab work in: TODAY LFT'S   Your physician recommends that you schedule a follow-up appointment in: 4-6 WEEKS WITH DR. Daleen Squibb

## 2012-03-27 NOTE — Telephone Encounter (Signed)
lmom labs normal 

## 2012-03-27 NOTE — Telephone Encounter (Signed)
Message copied by Tarri Fuller on Tue Mar 27, 2012  5:41 PM ------      Message from: [place], [place] T      Created: Tue Mar 27, 2012  2:23 PM       LFTs normal      Tereso Newcomer, [place]  2:23 PM 03/27/2012

## 2012-03-27 NOTE — Progress Notes (Signed)
[address] [place], [place]  [address] Phone: [phone] Fax:  [phone]  Date:  03/27/2012   Name:  [name]       DOB:  [birth year] MRN:  [id number]  PCP:  Lorretta Harp, MD, MD  Primary Cardiologist:  Dr. Valera Castle  Primary Electrophysiologist:  None    History of Present Illness: [name] is a 78 y.o. male who presents for post hospital follow up.  He has a h/o CAD, s/p Taxus DES to the prox to mid LAD in 7/04, patent stents and non-obs CAD by LHC in 1/07, good LVF, hyperlipidemia, COPD, GERD, hypothyroidism, DM2.  Last myoview 6/11:  Inf wall thinning suggestive of prior infarct with very mild inf-apical ischemia, EF 645 (low risk).    He was admitted 3/23-3/24 with chest pain in the setting of nausea and vomiting due to gastroenteritis.  MI was ruled out.  Echo 02/26/12: normal wall motion, EF 55-60%, PASP 43.  He was d/c to home for OP follow up.  Of note, LFTs were elevated.  ? If this was related to recent illness.  ALT was 70 and improved to 54 at d/c.    Since d/c, he is doing ok.  His daughter has been sick and he has not been exercising as usual.  No further chest pain.  No dyspnea, syncope, orthopnea, PND or edema.    Past Medical History  Diagnosis Date  . CAD (coronary artery disease)     s/p Taxus DES to prox and mid LAD 06/2003;  LHC  1/07: LM 205, LAD stents ok., oD2 70% jailed (tx conservatively), mid to dist RCA 40-50%, EF 40-45%;  echo 2/07: EF 55-65%  . Hyperlipidemia   . COPD (chronic obstructive pulmonary disease)   . GERD (gastroesophageal reflux disease)   . Osteoarthritis   . Anemia   . Hypothyroidism   . Diabetes mellitus   . Hiatal hernia   . Hemorrhoids   . History of renal calculi   . Diverticulosis of colon   . Adenomatous colon polyp   . Dermatitis   . Arthritis   . History of prostate cancer   . Migraines   . Transfusion history     Current Outpatient Prescriptions  Medication Sig  Dispense Refill  . aspirin 325 MG tablet Take 325 mg by mouth once.      Marland Kitchen HYDROcodone-acetaminophen (NORCO) 10-325 MG per tablet Take 0.25 tablets by mouth every 6 (six) hours as needed. Takes 1/4th tablet For pain      . metoprolol tartrate (LOPRESSOR) 25 MG tablet 1/2 tab po bid  90 tablet  3  . Multiple Vitamins-Minerals (OCUVITE PRESERVISION) TABS Take 1 tablet by mouth 2 (two) times daily.        . nitroGLYCERIN (NITROSTAT) 0.4 MG SL tablet Place 1 tablet (0.4 mg total) under the tongue every 5 (five) minutes x 3 doses as needed for chest pain.      Marland Kitchen omeprazole (PRILOSEC OTC) 20 MG tablet Take 1-2 tabs daily  42 tablet  3  . OVER THE COUNTER MEDICATION Take 1 tablet by mouth 2 (two) times daily. Calcium Citrate 500mg  w/ Vitamin D 800mg       . pravastatin (PRAVACHOL) 20 MG tablet Take 1 tablet (20 mg total) by mouth daily.  90 tablet  3  . pseudoephedrine (SUDAFED) 30 MG tablet Take 30 mg by mouth every 4 (four) hours as needed. Nasal decongestant      .  DISCONTD: Calcium Carbonate-Vitamin D (CALTRATE 600+D) 600-400 MG-UNIT per tablet Take 1 tablet by mouth 2 (two) times daily.          Allergies: Allergies  Allergen Reactions  . Aspirin     Daily use causes bleeding in the bladder.  Can take occasionally  . Meperidine Hcl     REACTION: combative    History  Substance Use Topics  . Smoking status: Former Games developer  . Smokeless tobacco: Not on file  . Alcohol Use: No     ROS:  Please see the history of present illness.    All other systems reviewed and negative.   PHYSICAL EXAM: VS:  BP 128/84  Pulse 67  Ht 5\' 11"  (1.803 m)  Wt 194 lb (87.998 kg)  BMI 27.06 kg/m2 Well nourished, well developed, in no acute distress HEENT: normal Neck: no JVD Cardiac:  normal S1, S2; RRR; no murmur Lungs:  clear to auscultation bilaterally, no wheezing, rhonchi or rales Abd: soft, nontender, no hepatomegaly Ext: no edema Skin: warm and dry Neuro:  CNs 2-12 intact, no focal abnormalities  noted  EKG:  NSR, HR 67, LAD, non-specific ST-T changes  ASSESSMENT AND PLAN:  1.  Chest Pain Resolved.  Atypical.  Likely related to GI illness.  It has been 2 years since last nuclear study.  Given recent admission to the hospital, recommend he proceed with ETT-Myoview.  This was the plan when he left the hospital, but the stress test was never scheduled.    2.  CAD Continue ASA and statin.  Schedule stress test as noted.  Follow up with Dr. Valera Castle in 4-6 mos.  3.  Elevated LFTs Repeat LFTs today.  Probably related to GI illness.  4.  Hyperlipidemia Lab Results  Component Value Date   CHOL 114 02/26/2012   HDL 44 02/26/2012   LDLCALC 51 02/26/2012   TRIG 94 02/26/2012   CHOLHDL 2.6 02/26/2012    Continue Pravstatin.    Signed, Tereso Newcomer, PA-C  9:45 AM 03/27/2012

## 2012-04-11 ENCOUNTER — Encounter (HOSPITAL_COMMUNITY): Payer: Medicare Other

## 2012-04-11 ENCOUNTER — Ambulatory Visit (HOSPITAL_COMMUNITY): Payer: Medicare Other | Attending: Cardiovascular Disease | Admitting: Radiology

## 2012-04-11 DIAGNOSIS — R002 Palpitations: Secondary | ICD-10-CM | POA: Insufficient documentation

## 2012-04-11 DIAGNOSIS — E119 Type 2 diabetes mellitus without complications: Secondary | ICD-10-CM | POA: Insufficient documentation

## 2012-04-11 DIAGNOSIS — R079 Chest pain, unspecified: Secondary | ICD-10-CM | POA: Insufficient documentation

## 2012-04-11 DIAGNOSIS — Z9861 Coronary angioplasty status: Secondary | ICD-10-CM | POA: Insufficient documentation

## 2012-04-11 DIAGNOSIS — J449 Chronic obstructive pulmonary disease, unspecified: Secondary | ICD-10-CM | POA: Insufficient documentation

## 2012-04-11 DIAGNOSIS — J4489 Other specified chronic obstructive pulmonary disease: Secondary | ICD-10-CM | POA: Insufficient documentation

## 2012-04-11 DIAGNOSIS — E785 Hyperlipidemia, unspecified: Secondary | ICD-10-CM | POA: Insufficient documentation

## 2012-04-11 DIAGNOSIS — Z87891 Personal history of nicotine dependence: Secondary | ICD-10-CM | POA: Insufficient documentation

## 2012-04-11 DIAGNOSIS — R112 Nausea with vomiting, unspecified: Secondary | ICD-10-CM | POA: Insufficient documentation

## 2012-04-11 DIAGNOSIS — I4949 Other premature depolarization: Secondary | ICD-10-CM

## 2012-04-11 DIAGNOSIS — R0602 Shortness of breath: Secondary | ICD-10-CM

## 2012-04-11 MED ORDER — TECHNETIUM TC 99M TETROFOSMIN IV KIT
10.0000 | PACK | Freq: Once | INTRAVENOUS | Status: AC | PRN
  Administered 2012-04-11: 10 via INTRAVENOUS

## 2012-04-11 MED ORDER — TECHNETIUM TC 99M TETROFOSMIN IV KIT
30.0000 | PACK | Freq: Once | INTRAVENOUS | Status: AC | PRN
  Administered 2012-04-11: 30 via INTRAVENOUS

## 2012-04-11 NOTE — Progress Notes (Signed)
[facility] SITE 3 NUCLEAR MED [address] [place] [address] [phone]  Cardiology Nuclear Med Study  [name] is a 78 y.o. male     MRN : [id number]     DOB: 1930-03-21  Procedure Date: 04/11/2012  Nuclear Med Background Indication for Stress Test:  Evaluation for Ischemia, Stent Patency and PTCA Patency History:  COPD, 7/04 Stents/Angioplasty LAD, 01/07 Heart Cath Patent Stents, N/O CAD, 05/18/10 MPS EF 64% Inf. Wall thinning, 02/26/12 ECHO: 55-60%  Cardiac Risk Factors: History of Smoking, Lipids and NIDDM  Symptoms:  Chest Pain, Nausea, Palpitations and Vomiting   Nuclear Pre-Procedure Caffeine/Decaff Intake:  None NPO After: 7:30pm   Lungs:  clear O2 Sat: 97% on room air. IV 0.9% NS with Angio Cath:  20g  IV Site: R Antecubital  IV Started by:  Bonnita Levan, RN  Chest Size (in):  44 Cup Size: n/a  Height: 5\' 11"  (1.803 m)  Weight:  190 lb (86.183 kg)  BMI:  Body mass index is 26.50 kg/(m^2). Tech Comments:  Held Metoprolol x 24 hrs    Nuclear Med Study 1 or 2 day study: 1 day  Stress Test Type:  Stress  Reading MD: Kristeen Miss, MD  Order Authorizing Provider:  Valera Castle, MD, Tereso Newcomer, PA  Resting Radionuclide: Technetium 55m Tetrofosmin  Resting Radionuclide Dose: 10.9 mCi   Stress Radionuclide:  Technetium 50m Tetrofosmin  Stress Radionuclide Dose: 32.9 mCi           Stress Protocol Rest HR: 62 Stress HR: 151  Rest BP: 140/87 Stress BP: 203/72  Exercise Time (min): 3:00 METS: 4.60   Predicted Max HR: 141 bpm % Max HR: 107.09 bpm Rate Pressure Product: 21308   Dose of Adenosine (mg):  n/a Dose of Lexiscan: n/a mg  Dose of Atropine (mg): n/a Dose of Dobutamine: n/a mcg/kg/min (at max HR)  Stress Test Technologist: Milana Na, EMT-P  Nuclear Technologist:  Domenic Polite, CNMT     Rest Procedure:  Myocardial perfusion imaging was performed at rest 45 minutes following the intravenous administration of Technetium 29m  Tetrofosmin. Rest ECG: NSR with non-specific ST-T wave changes  Stress Procedure:  The patient performed treadmill exercise using a Bruce  Protocol for 3:00  minutes. The patient stopped due to fatigue, sob, and denied any chest pain.  There were no significant ST-T wave changes, occ pvcs,v cuplet, and a 5 beat run of vtach.  Technetium 49m Tetrofosmin was injected at peak exercise and myocardial perfusion imaging was performed after a brief delay. Stress ECG: No significant change from baseline ECG  QPS Raw Data Images:  Normal; no motion artifact; normal heart/lung ratio. Stress Images:  Normal homogeneous uptake in all areas of the myocardium. Rest Images:  Normal homogeneous uptake in all areas of the myocardium. Subtraction (SDS):  Normal Transient Ischemic Dilatation (Normal <1.22):  0.83 Lung/Heart Ratio (Normal <0.45):  0.38  Quantitative Gated Spect Images QGS EDV:  NA QGS ESV:  NA  Impression Exercise Capacity:  Poor exercise capacity. BP Response:  Hypertensive blood pressure response. Clinical Symptoms:  There is dyspnea. ECG Impression:  Ventricular couplets and a 5 beat run of VT.  No obvious ST segment depression but hard to interpret at peak exercise due to artifact Comparison with Prior Nuclear Study: No images to compare  Overall Impression:  Normal stress nuclear study. Normal perfusion images but 5 beat run of VT with couplets, poor exercise tolerance and hypertensive response to exercise  LV Ejection Fraction: Study not gated.  LV Wall Motion:  NA  [name]

## 2012-04-13 ENCOUNTER — Telehealth: Payer: Self-pay | Admitting: *Deleted

## 2012-04-13 MED ORDER — METOPROLOL TARTRATE 25 MG PO TABS: 25.0000 mg | ORAL_TABLET | Freq: Two times a day (BID) | ORAL | Status: DC

## 2012-04-13 NOTE — Telephone Encounter (Signed)
Message copied by Tarri Fuller on Fri Apr 13, 2012  2:42 PM ------      Message from: [place], [place] T      Created: Fri Apr 13, 2012  1:14 PM       Stress test ok.  No ischemia.      Did have some fast heart beats from bottom of heart.      LV function normal on recent echo.      D/w Dr. Daleen Squibb.      Increase Lopressor to 25 mg twice daily.      Tereso Newcomer, PA-C  1:13 PM 04/13/2012

## 2012-04-13 NOTE — Telephone Encounter (Signed)
lmom ptcb to discuss increase dose of metoprolol 25 mg bid per TW. Med list changed to reflect the new dose metoprolol tart 25 mg bid, new rx sent in today

## 2012-04-17 ENCOUNTER — Telehealth: Payer: Self-pay | Admitting: Cardiology

## 2012-04-17 NOTE — Telephone Encounter (Signed)
Fu call °Pt returning your call  °

## 2012-04-17 NOTE — Telephone Encounter (Signed)
Patient called to say he received your message regarding the report, he has an appnt set up at the end of the month, and will contact you back when he returns as he is out of town.  Per patient there is not need for return call

## 2012-04-17 NOTE — Telephone Encounter (Signed)
Patient is aware of stress test results, and to increase Metoprolol dose to 25 mg twice a day.

## 2012-05-02 ENCOUNTER — Ambulatory Visit (INDEPENDENT_AMBULATORY_CARE_PROVIDER_SITE_OTHER): Payer: Medicare Other | Admitting: Cardiology

## 2012-05-02 ENCOUNTER — Encounter: Payer: Self-pay | Admitting: Cardiology

## 2012-05-02 VITALS — BP 122/78 | HR 72 | Ht 71.0 in | Wt 196.0 lb

## 2012-05-02 DIAGNOSIS — I2510 Atherosclerotic heart disease of native coronary artery without angina pectoris: Secondary | ICD-10-CM

## 2012-05-02 DIAGNOSIS — R079 Chest pain, unspecified: Secondary | ICD-10-CM

## 2012-05-02 DIAGNOSIS — E785 Hyperlipidemia, unspecified: Secondary | ICD-10-CM

## 2012-05-02 MED ORDER — METOPROLOL TARTRATE 25 MG PO TABS: 25.0000 mg | ORAL_TABLET | Freq: Two times a day (BID) | ORAL | Status: DC

## 2012-05-02 NOTE — Patient Instructions (Signed)
Your physician recommends that you continue on your current medications as directed. Please refer to the Current Medication list given to you today.  Your physician wants you to follow-up in: 1 year. You will receive a reminder letter in the mail two months in advance. If you don't receive a letter, please call our office to schedule the follow-up appointment.  

## 2012-05-02 NOTE — Progress Notes (Signed)
HPI [name] returns today for evaluation and management of his coronary artery disease. He was evaluated for chest pain in the hospital in March. He ruled out for MI. Medical therapy was recommended with a outpatient stress was low risk except for PVCs and nonsustained VT. We increased his metoprolol 25 mg twice a day.  He's asymptomatic no further chest pain. He denies any presyncope syncope or palpitations.  On low-dose aspirin, he's had no recurrent bleeding from his bladder.  Past Medical History  Diagnosis Date  . CAD (coronary artery disease)     s/p Taxus DES to prox and mid LAD 06/2003;  LHC  1/07: LM 205, LAD stents ok., oD2 70% jailed (tx conservatively), mid to dist RCA 40-50%, EF 40-45%;  echo 2/07: EF 55-65%  . Hyperlipidemia   . COPD (chronic obstructive pulmonary disease)   . GERD (gastroesophageal reflux disease)   . Osteoarthritis   . Anemia   . Hypothyroidism   . Diabetes mellitus   . Hiatal hernia   . Hemorrhoids   . History of renal calculi   . Diverticulosis of colon   . Adenomatous colon polyp   . Dermatitis   . Arthritis   . History of prostate cancer   . Migraines   . Transfusion history     Current Outpatient Prescriptions  Medication Sig Dispense Refill  . aspirin 81 MG tablet Take 81 mg by mouth daily.      Marland Kitchen HYDROcodone-acetaminophen (NORCO) 10-325 MG per tablet Take 0.25 tablets by mouth every 6 (six) hours as needed. Takes 1/4th tablet For pain      . metoprolol tartrate (LOPRESSOR) 25 MG tablet Take 1 tablet (25 mg total) by mouth 2 (two) times daily.  60 tablet  11  . Multiple Vitamins-Minerals (OCUVITE PRESERVISION) TABS Take 1 tablet by mouth 2 (two) times daily.        . nitroGLYCERIN (NITROSTAT) 0.4 MG SL tablet Place 1 tablet (0.4 mg total) under the tongue every 5 (five) minutes x 3 doses as needed for chest pain.      Marland Kitchen omeprazole (PRILOSEC OTC) 20 MG tablet Take 1-2 tabs daily  42 tablet  3  . OVER THE COUNTER MEDICATION Take 1 tablet by  mouth 2 (two) times daily. Calcium Citrate 500mg  w/ Vitamin D 800mg       . pravastatin (PRAVACHOL) 20 MG tablet Take 1 tablet (20 mg total) by mouth daily.  90 tablet  3  . pseudoephedrine (SUDAFED) 30 MG tablet Take 30 mg by mouth every 4 (four) hours as needed. Nasal decongestant      . DISCONTD: Calcium Carbonate-Vitamin D (CALTRATE 600+D) 600-400 MG-UNIT per tablet Take 1 tablet by mouth 2 (two) times daily.          Allergies  Allergen Reactions  . Aspirin     Daily use causes bleeding in the bladder.  Can take occasionally  . Meperidine Hcl     REACTION: combative    Family History  Problem Relation Age of Onset  . Hypertension Other   . Arthritis Other   . Cancer Other     History   Social History  . Marital Status: Married    Spouse Name: N/A    Number of Children: N/A  . Years of Education: N/A   Occupational History  . Retired    Social History Main Topics  . Smoking status: Former Games developer  . Smokeless tobacco: Not on file  . Alcohol Use: No  .  Drug Use: No  . Sexually Active: Not on file   Other Topics Concern  . Not on file   Social History Narrative   No regular exerciseHHof 2   Married wife no pets Remote hx of tobacco stopped 1965No etohREtired Scientist, research (physical sciences) level.    ROS ALL NEGATIVE EXCEPT THOSE NOTED IN HPI  PE  General Appearance: well developed, well nourished in no acute distress HEENT: symmetrical face, PERRLA, good dentition  Neck: no JVD, thyromegaly, or adenopathy, trachea midline Chest: symmetric without deformity Cardiac: PMI non-displaced, RRR, normal S1, S2, no gallop or murmur Lung: clear to ausculation and percussion Vascular: all pulses full without bruits  Abdominal: nondistended, nontender, good bowel sounds, no HSM, no bruits Extremities: no cyanosis, clubbing or edema, no sign of DVT, no varicosities  Skin: normal color, no rashes Neuro: alert and oriented x 3, non-focal Pysch: normal affect  EKG  BMET      Component Value Date/Time   NA 144 02/26/2012 0640   K 3.7 02/26/2012 0640   CL 110 02/26/2012 0640   CO2 28 02/26/2012 0640   GLUCOSE 95 02/26/2012 0640   BUN 17 02/26/2012 0640   CREATININE 0.94 02/26/2012 0640   CALCIUM 9.0 02/26/2012 0640   GFRNONAA 77* 02/26/2012 0640   GFRAA 90* 02/26/2012 0640    Lipid Panel     Component Value Date/Time   CHOL 114 02/26/2012 0640   TRIG 94 02/26/2012 0640   HDL 44 02/26/2012 0640   CHOLHDL 2.6 02/26/2012 0640   VLDL 19 02/26/2012 0640   LDLCALC 51 02/26/2012 0640    CBC    Component Value Date/Time   WBC 6.3 02/26/2012 0640   RBC 4.40 02/26/2012 0640   HGB 12.8* 02/26/2012 0640   HCT 38.1* 02/26/2012 0640   PLT 134* 02/26/2012 0640   MCV 86.6 02/26/2012 0640   MCH 29.1 02/26/2012 0640   MCHC 33.6 02/26/2012 0640   RDW 13.3 02/26/2012 0640   LYMPHSABS 1.4 02/15/2012 1115   MONOABS 0.7 02/15/2012 1115   EOSABS 0.3 02/15/2012 1115   BASOSABS 0.0 02/15/2012 1115

## 2012-05-02 NOTE — Assessment & Plan Note (Signed)
Stable with a recent low risk stress Myoview. Continue beta blocker and aspirin. Patient carrying sublingual nitroglycerin. Continue statin. Return to the office in one year.

## 2012-06-26 ENCOUNTER — Telehealth: Payer: Self-pay | Admitting: Family Medicine

## 2012-06-26 NOTE — Telephone Encounter (Signed)
Can refill 70# x 3

## 2012-06-26 NOTE — Telephone Encounter (Signed)
Last seen 02/15/12.  Has fu on 08/17/12.  Not sure when last filled if ever.  Takes 1/4 tab.  Please advise.  Thanks!!!

## 2012-06-27 ENCOUNTER — Other Ambulatory Visit: Payer: Self-pay | Admitting: Family Medicine

## 2012-06-27 MED ORDER — HYDROCODONE-ACETAMINOPHEN 10-325 MG PO TABS: 0.2500 | ORAL_TABLET | Freq: Four times a day (QID) | ORAL | Status: DC | PRN

## 2012-06-27 NOTE — Telephone Encounter (Signed)
Called to the pharmacy.

## 2012-08-13 ENCOUNTER — Ambulatory Visit (INDEPENDENT_AMBULATORY_CARE_PROVIDER_SITE_OTHER): Payer: Medicare Other | Admitting: Internal Medicine

## 2012-08-13 ENCOUNTER — Encounter: Payer: Self-pay | Admitting: Internal Medicine

## 2012-08-13 VITALS — BP 140/80 | HR 66 | Temp 98.7°F | Wt 196.0 lb

## 2012-08-13 DIAGNOSIS — K219 Gastro-esophageal reflux disease without esophagitis: Secondary | ICD-10-CM

## 2012-08-13 DIAGNOSIS — I2510 Atherosclerotic heart disease of native coronary artery without angina pectoris: Secondary | ICD-10-CM

## 2012-08-13 DIAGNOSIS — F1190 Opioid use, unspecified, uncomplicated: Secondary | ICD-10-CM

## 2012-08-13 DIAGNOSIS — F1110 Opioid abuse, uncomplicated: Secondary | ICD-10-CM

## 2012-08-13 DIAGNOSIS — E785 Hyperlipidemia, unspecified: Secondary | ICD-10-CM

## 2012-08-13 DIAGNOSIS — D485 Neoplasm of uncertain behavior of skin: Secondary | ICD-10-CM

## 2012-08-13 DIAGNOSIS — M129 Arthropathy, unspecified: Secondary | ICD-10-CM

## 2012-08-13 DIAGNOSIS — Z79899 Other long term (current) drug therapy: Secondary | ICD-10-CM

## 2012-08-13 LAB — HEPATIC FUNCTION PANEL
ALT: 12 U/L (ref 0–53)
AST: 15 U/L (ref 0–37)
Alkaline Phosphatase: 66 U/L (ref 39–117)
Bilirubin, Direct: 0.2 mg/dL (ref 0.0–0.3)
Total Bilirubin: 0.8 mg/dL (ref 0.3–1.2)

## 2012-08-13 LAB — BASIC METABOLIC PANEL
BUN: 18 mg/dL (ref 6–23)
Chloride: 104 mEq/L (ref 96–112)
Potassium: 4.4 mEq/L (ref 3.5–5.1)
Sodium: 141 mEq/L (ref 135–145)

## 2012-08-13 NOTE — Patient Instructions (Signed)
You are doing well .   If you change your mind can get flu shot any time.   Will get a dermatology consult about the skin areas.  Will notify you  of labs when available.

## 2012-08-13 NOTE — Progress Notes (Signed)
Subjective:    Patient ID: [name], male    DOB: [birth year], 78 y.o.   MRN: [id number]  HPI Patient comes in today for follow up of  multiple medical problems.  No major changes in health since last visit but wife having problems being evaluated . No current chest pain and breathing is good. Pain med for djd OA:   Splits medication  2 per day.  If doesn't take it  "down all day ".but if takes  Functions and does  Well with this.  No falling side effects he reports.  GERD:   Not if on pill and  Wants to get off pill tol dto take this by GI.     Pro biotic.  Remote hx of NSAID ? GI ulcer.  LIPIDS:   No se of med.  Review of Systems No cp sob.   Recurrent  Bleeding hem.   Has been evaluated in the past.  Has some red scaly areas on face  No bleeding.  Remote hx of nsaid ulcer  Past history family history social history reviewed in the electronic medical record.   Outpatient Encounter Prescriptions as of 08/13/2012  Medication Sig Dispense Refill  . aspirin 81 MG tablet Take 81 mg by mouth daily.      Marland Kitchen HYDROcodone-acetaminophen (NORCO) 10-325 MG per tablet Take 0.5 tablets by mouth every 6 (six) hours as needed. Takes 1/4th tablet For pain  70 tablet  3  . metoprolol tartrate (LOPRESSOR) 25 MG tablet Take 1 tablet (25 mg total) by mouth 2 (two) times daily.  180 tablet  3  . Multiple Vitamins-Minerals (OCUVITE PRESERVISION) TABS Take 1 tablet by mouth 2 (two) times daily.        . nitroGLYCERIN (NITROSTAT) 0.4 MG SL tablet Place 1 tablet (0.4 mg total) under the tongue every 5 (five) minutes x 3 doses as needed for chest pain.      Marland Kitchen omeprazole (PRILOSEC OTC) 20 MG tablet Take 1-2 tabs daily  42 tablet  3  . OVER THE COUNTER MEDICATION Take 1 tablet by mouth 2 (two) times daily. Calcium Citrate 500mg  w/ Vitamin D 800mg       . pravastatin (PRAVACHOL) 20 MG tablet Take 1 tablet (20 mg total) by mouth daily.  90 tablet  3  . pseudoephedrine (SUDAFED) 30 MG tablet Take 30 mg by mouth every 4  (four) hours as needed. Nasal decongestant           Objective:   Physical Exam BP 140/80  Pulse 66  Temp 98.7 F (37.1 C) (Oral)  Wt 196 lb (88.905 kg)  SpO2 96% WDWN in nad  Oriented x 3 and no noted deficits in memory, attention, and speech. HEENT atraumatic op clear tongue midine Neck: Supple without adenopathy or masses or bruits Check cta inc ap diameter  bs =  Cor rr no g or m  No clubbing cyanosis or edema Ext healed knee surgeries  abulatory and independent .  SKIN:  4 - 6 mm pink scaly area irreg right face with some induration?    Lab Results  Component Value Date   WBC 6.3 02/26/2012   HGB 12.8* 02/26/2012   HCT 38.1* 02/26/2012   PLT 134* 02/26/2012   GLUCOSE 88 08/13/2012   CHOL 114 02/26/2012   TRIG 94 02/26/2012   HDL 44 02/26/2012   LDLCALC 51 02/26/2012   ALT 12 08/13/2012   AST 15 08/13/2012   NA 141 08/13/2012   K  4.4 08/13/2012   CL 104 08/13/2012   CREATININE 0.9 08/13/2012   BUN 18 08/13/2012   CO2 31 08/13/2012   TSH 1.13 02/15/2012   INR 1.09 02/25/2012       Assessment & Plan:   Pain med  Management :OA back and knees   Stable on current meds and helps  More beneft that risk at this time  Call for refills  Monitor lfts and bmp if ok check in 6 months.  SKIN: ? ak vs early SCCa   Get skin consult for evaluation   will refer.  COPD prev dx no sx at present   CAD stable at present    HCM declines flu shot for various reasons   Disc today . Counseled. PPI use  Hx of bleeding in remote past nsaid   ? Getting off  Ok to wean off and on if helps. And not on nsaid

## 2012-08-15 ENCOUNTER — Encounter: Payer: Self-pay | Admitting: Family Medicine

## 2012-08-17 ENCOUNTER — Ambulatory Visit: Payer: Medicare Other | Admitting: Internal Medicine

## 2012-08-19 DIAGNOSIS — Z79899 Other long term (current) drug therapy: Secondary | ICD-10-CM | POA: Insufficient documentation

## 2012-09-02 ENCOUNTER — Other Ambulatory Visit: Payer: Self-pay | Admitting: Cardiology

## 2012-10-25 ENCOUNTER — Other Ambulatory Visit: Payer: Self-pay | Admitting: Internal Medicine

## 2012-11-07 ENCOUNTER — Other Ambulatory Visit: Payer: Self-pay | Admitting: *Deleted

## 2012-11-07 MED ORDER — PRAVASTATIN SODIUM 20 MG PO TABS: 20.0000 mg | ORAL_TABLET | Freq: Every day | ORAL | Status: DC

## 2012-11-30 ENCOUNTER — Other Ambulatory Visit: Payer: Self-pay | Admitting: Internal Medicine

## 2013-02-11 ENCOUNTER — Encounter: Payer: Self-pay | Admitting: Internal Medicine

## 2013-02-11 ENCOUNTER — Ambulatory Visit (INDEPENDENT_AMBULATORY_CARE_PROVIDER_SITE_OTHER): Payer: Medicare Other | Admitting: Internal Medicine

## 2013-02-11 VITALS — BP 136/82 | HR 71 | Temp 97.9°F | Wt 196.0 lb

## 2013-02-11 DIAGNOSIS — I2510 Atherosclerotic heart disease of native coronary artery without angina pectoris: Secondary | ICD-10-CM

## 2013-02-11 DIAGNOSIS — Z79899 Other long term (current) drug therapy: Secondary | ICD-10-CM

## 2013-02-11 DIAGNOSIS — K219 Gastro-esophageal reflux disease without esophagitis: Secondary | ICD-10-CM

## 2013-02-11 DIAGNOSIS — M1990 Unspecified osteoarthritis, unspecified site: Secondary | ICD-10-CM

## 2013-02-11 DIAGNOSIS — E785 Hyperlipidemia, unspecified: Secondary | ICD-10-CM

## 2013-02-11 LAB — HEPATIC FUNCTION PANEL
ALT: 15 U/L (ref 0–53)
Bilirubin, Direct: 0.1 mg/dL (ref 0.0–0.3)
Total Bilirubin: 0.7 mg/dL (ref 0.3–1.2)
Total Protein: 6.3 g/dL (ref 6.0–8.3)

## 2013-02-11 LAB — BASIC METABOLIC PANEL
BUN: 23 mg/dL (ref 6–23)
CO2: 29 mEq/L (ref 19–32)
Chloride: 107 mEq/L (ref 96–112)
Creatinine, Ser: 0.9 mg/dL (ref 0.4–1.5)
Potassium: 4.3 mEq/L (ref 3.5–5.1)

## 2013-02-11 LAB — CBC WITH DIFFERENTIAL/PLATELET
Basophils Relative: 0.4 % (ref 0.0–3.0)
Eosinophils Relative: 4.3 % (ref 0.0–5.0)
Hemoglobin: 14.2 g/dL (ref 13.0–17.0)
Lymphocytes Relative: 21 % (ref 12.0–46.0)
MCHC: 33.6 g/dL (ref 30.0–36.0)
Neutro Abs: 3.7 10*3/uL (ref 1.4–7.7)
Neutrophils Relative %: 66.8 % (ref 43.0–77.0)
RBC: 4.86 Mil/uL (ref 4.22–5.81)
WBC: 5.6 10*3/uL (ref 4.5–10.5)

## 2013-02-11 LAB — LIPID PANEL
Cholesterol: 129 mg/dL (ref 0–200)
HDL: 40.2 mg/dL (ref >39.00)
LDL Cholesterol: 72 mg/dL (ref 0–99)
VLDL: 17 mg/dL (ref 0.0–40.0)

## 2013-02-11 LAB — TSH: TSH: 1.27 u[IU]/mL (ref 0.35–5.50)

## 2013-02-11 MED ORDER — HYDROCODONE-ACETAMINOPHEN 10-325 MG PO TABS: ORAL_TABLET | ORAL | Status: DC

## 2013-02-11 NOTE — Progress Notes (Signed)
Chief Complaint  Patient presents with  . Follow-up    HPI: Patient comes in today for follow up of  multiple medical problems.  medication management and review Since last time had a recent resp sx .Had a cold ; now is better; no fever chest pain shortness of breath.  Brings in his bottles to review GIOmeprazole .    Now doing over the counter.  OA djd painTaking pain med 1/2 5 x per day.  More functional with this.  Than without  And denies falling and mental side effects . cv no cp sob new sx  Lipids  No change in meds  NO bleeding  Not taking asa because of contraindication. ROS: See pertinent positives and negatives per HPI. No falling swelling has begun to take a new Pilates  class with his wife .    Past Medical History  Diagnosis Date  . CAD (coronary artery disease)     s/p Taxus DES to prox and mid LAD 06/2003;  LHC  1/07: LM 205, LAD stents ok., oD2 70% jailed (tx conservatively), mid to dist RCA 40-50%, EF 40-45%;  echo 2/07: EF 55-65%  . Hyperlipidemia   . COPD (chronic obstructive pulmonary disease)   . GERD (gastroesophageal reflux disease)   . Osteoarthritis   . Anemia   . Hypothyroidism   . Diabetes mellitus   . Hiatal hernia   . Hemorrhoids   . History of renal calculi   . Diverticulosis of colon   . Adenomatous colon polyp   . Dermatitis   . Arthritis   . History of prostate cancer   . Migraines   . Transfusion history     Family History  Problem Relation Age of Onset  . Hypertension Other   . Arthritis Other   . Cancer Other     History   Social History  . Marital Status: Married    Spouse Name: N/A    Number of Children: N/A  . Years of Education: N/A   Occupational History  . Retired    Social History Main Topics  . Smoking status: Former Games developer  . Smokeless tobacco: None  . Alcohol Use: No  . Drug Use: No  . Sexually Active: None   Other Topics Concern  . None   Social History Narrative   No regular exercise   HHof 2   Married  wife no pets       Remote hx of tobacco stopped 1965   No etoh   REtired Scientist, research (physical sciences) level.    Outpatient Encounter Prescriptions as of 02/11/2013  Medication Sig Dispense Refill  . HYDROcodone-acetaminophen (NORCO) 10-325 MG per tablet TAKE 1/2 TABLET EVERY 5 HOURS AS NEEDED po  70 tablet  3  . metoprolol tartrate (LOPRESSOR) 25 MG tablet Take 1 tablet (25 mg total) by mouth 2 (two) times daily.  180 tablet  3  . Multiple Vitamins-Minerals (OCUVITE PRESERVISION) TABS Take 1 tablet by mouth 2 (two) times daily.        . nitroGLYCERIN (NITROSTAT) 0.4 MG SL tablet Place 1 tablet (0.4 mg total) under the tongue every 5 (five) minutes x 3 doses as needed for chest pain.      Marland Kitchen omeprazole (PRILOSEC OTC) 20 MG tablet Take 1-2 tabs daily  42 tablet  3  . OVER THE COUNTER MEDICATION Take 1 tablet by mouth 2 (two) times daily. Calcium Citrate 500mg  w/ Vitamin D 800mg       . pravastatin (PRAVACHOL) 20  MG tablet Take 1 tablet (20 mg total) by mouth daily.  90 tablet  3  . pseudoephedrine (SUDAFED) 30 MG tablet Take 30 mg by mouth every 4 (four) hours as needed. Nasal decongestant      . [DISCONTINUED] HYDROcodone-acetaminophen (NORCO) 10-325 MG per tablet TAKE 1/2 TABLET EVERY 5 HOURS AS NEEDED  70 tablet  3  . [DISCONTINUED] aspirin 81 MG tablet Take 81 mg by mouth daily.       No facility-administered encounter medications on file as of 02/11/2013.    EXAM:  BP 136/82  Pulse 71  Temp(Src) 97.9 F (36.6 C) (Oral)  Wt 196 lb (88.905 kg)  BMI 27.35 kg/m2  SpO2 98%  Body mass index is 27.35 kg/(m^2).  GENERAL: vitals reviewed and listed above, alert, oriented, appears well hydrated and in no acute distress Ambulatory slightly hard of hearing. HEENT: atraumatic, conjunctiva  clear, no obvious abnormalities on inspection of external nose and ears OP : no lesion edema or exudate  NECK: no obvious masses on inspection palpation  No jvd LUNGS: clear to auscultation bilaterally, no wheezes,  rales or rhonchi, good air movement CV: HRRR, no clubbing cyanosis or  peripheral edema nl cap refill  MS: moves all extremities without noticeable focal  abnormalityOA changes  Gait stable  PSYCH: pleasant and cooperative, no obvious depression or anxiety Lab Results  Component Value Date   WBC 5.6 02/11/2013   HGB 14.2 02/11/2013   HCT 42.1 02/11/2013   PLT 169.0 02/11/2013   GLUCOSE 95 02/11/2013   CHOL 129 02/11/2013   TRIG 85.0 02/11/2013   HDL 40.20 02/11/2013   LDLCALC 72 02/11/2013   ALT 15 02/11/2013   AST 20 02/11/2013   NA 143 02/11/2013   K 4.3 02/11/2013   CL 107 02/11/2013   CREATININE 0.9 02/11/2013   BUN 23 02/11/2013   CO2 29 02/11/2013   TSH 1.27 02/11/2013   INR 1.09 02/25/2012    ASSESSMENT AND PLAN:  Discussed the following assessment and plan:  Medication management - Plan: Basic metabolic panel, TSH, Lipid panel, Hepatic function panel, CBC with Differential  OSTEOARTHRITIS - No change in pain medication no significant side effect reported makes him more functional we'll continue and monitor. - Plan: Basic metabolic panel, TSH, Lipid panel, Hepatic function panel, CBC with Differential  HYPERLIPIDEMIA - Due for labs get today. We'll send copy to Dr. wall - Plan: Basic metabolic panel, TSH, Lipid panel, Hepatic function panel, CBC with Differential  ARTHRITIS - Plan: Basic metabolic panel, TSH, Lipid panel, Hepatic function panel, CBC with Differential  CORONARY ARTERY DISEASE - No recent symptom - Plan: Basic metabolic panel, TSH, Lipid panel, Hepatic function panel, CBC with Differential  GERD - Taking over-the-counter omeprazole which ever is least expensive. - Plan: Basic metabolic panel, TSH, Lipid panel, Hepatic function panel, CBC with Differential  -Patient advised to return or notify health care team  if symptoms worsen or persist or new concerns arise.  Patient Instructions  Continue lifestyle intervention healthy eating and exercise . Will notify you  of  labs when available. ROV in 4 months or as needed   Neta Mends. Desia Saban M.D.

## 2013-02-11 NOTE — Patient Instructions (Addendum)
Continue lifestyle intervention healthy eating and exercise . Will notify you  of labs when available. ROV in 4 months or as needed

## 2013-02-12 ENCOUNTER — Encounter: Payer: Self-pay | Admitting: Family Medicine

## 2013-02-18 ENCOUNTER — Ambulatory Visit
Admission: RE | Admit: 2013-02-18 | Discharge: 2013-02-18 | Disposition: A | Discharge status: Home / Self Care | Payer: Medicare Other | Source: Ambulatory Visit | Attending: Orthopedic Surgery | Admitting: Orthopedic Surgery

## 2013-02-18 ENCOUNTER — Other Ambulatory Visit: Payer: Self-pay | Admitting: Orthopedic Surgery

## 2013-02-18 DIAGNOSIS — S2239XA Fracture of one rib, unspecified side, initial encounter for closed fracture: Secondary | ICD-10-CM

## 2013-02-18 MED ORDER — IOHEXOL 300 MG/ML  SOLN
100.0000 mL | Freq: Once | INTRAMUSCULAR | Status: AC | PRN
  Administered 2013-02-18: 100 mL via INTRAVENOUS

## 2013-03-06 ENCOUNTER — Telehealth: Payer: Self-pay | Admitting: Cardiology

## 2013-03-06 NOTE — Telephone Encounter (Signed)
I called and talked with pt wife. States his gait is still a little unsteady. His speech is better.  States her husband does not want to go to the hospital.  Stated they would see how he was tomorrow. "he really gave [place] a scare this morning... You can catch so many things at the hospital" Reenforced early intervention in the event of a a stroke. Wife understands.  Reassurance given  Mylo Red RN

## 2013-03-06 NOTE — Telephone Encounter (Signed)
I talked with pt wife about recent (as of 3 am this morning) changes in pt speech & gait.  She notes pt gait is wobbly, experienced nausea this morning, and  "a little off" with his speech stating that it was slurred "once in a while"  States pt went out this am for a 6:30am meeting and driving was off.  He was followed home, unbeknownst to pt, b/c he driving appeared to "be unstable"  Wife states that his speech seems slurred at times like he doesn't have enough energy.   He drank 3 full glasses of water at his meeting b/c they thought he might be dehydrated.  Upon home speech" still a little off" gait wobbly almost drunk like.  Wife stated pt wanted to lye down and sleep it off.  States he is sleeping now but has been up to the bathroom & gait is still wobbly  Reviewed s/s of stroke with wife & she does not want to call 911.   She does not want to take him to the emergency room at this time.  "our daughter got MRSA there "  She states she will wait until he gets up but will not call 911.  States she will drive him to the ED if she felt he needed to go. Reiterated the iimportance of early detection & treatment.   I told her she could call back and let us know his condition & if going to the ED would call ahead and let them know.  She is very firm on not going to the ED or calling 911 at this time.  States that pt cannot take aspirin, nsaids b/c they cause him to bleed. Mylo Red RN

## 2013-03-06 NOTE — Telephone Encounter (Signed)
New problem   Pt is unstable walking/nausea and BP is 155/80.Marland KitchenMarland Kitchenpulse is 54. Pt would like to talk to nurse concerning this matter.

## 2013-05-01 ENCOUNTER — Other Ambulatory Visit: Payer: Self-pay | Admitting: Cardiology

## 2013-05-01 NOTE — Telephone Encounter (Signed)
metoprolol tartrate (LOPRESSOR) 25 MG tablet  Take 1 tablet (25 mg total) by mouth 2 (two) times daily.   60 tablet    Patient Instructions:  Your physician recommends that you continue on your current medications as directed. Please refer to the Current Medication list given to you today. Your physician wants you to follow-up in: 1 year. You will receive a reminder letter in the mail two months in advance. If you don't receive a letter, please call our office to schedule the follow-up appointment. Previous Visit  Provider Department Encounter #  04/17/2012  9:10 AM Valera Castle, MD [facility] [id number]  FOLLOW UP OFFICE VISIT SCHEDULED 06/13/13 WITH DR WALL

## 2013-06-13 ENCOUNTER — Ambulatory Visit: Payer: Medicare Other | Admitting: Internal Medicine

## 2013-06-13 ENCOUNTER — Ambulatory Visit: Payer: Medicare Other | Admitting: Cardiology

## 2013-06-14 ENCOUNTER — Ambulatory Visit: Payer: Medicare Other | Admitting: Internal Medicine

## 2013-07-01 ENCOUNTER — Other Ambulatory Visit: Payer: Self-pay | Admitting: Internal Medicine

## 2013-07-01 NOTE — Telephone Encounter (Signed)
Last seen and filled on 02/11/13 #70 with 3 additional refills Has no future appointment. Please advise. Thanks!!

## 2013-07-01 NOTE — Telephone Encounter (Signed)
Ok to refill x 2    Ov before runs out.

## 2013-07-31 ENCOUNTER — Encounter: Payer: Self-pay | Admitting: Internal Medicine

## 2013-07-31 ENCOUNTER — Ambulatory Visit (INDEPENDENT_AMBULATORY_CARE_PROVIDER_SITE_OTHER): Payer: Medicare Other | Admitting: Internal Medicine

## 2013-07-31 VITALS — BP 138/72 | HR 67 | Temp 97.5°F | Wt 196.0 lb

## 2013-07-31 DIAGNOSIS — M1990 Unspecified osteoarthritis, unspecified site: Secondary | ICD-10-CM

## 2013-07-31 DIAGNOSIS — D649 Anemia, unspecified: Secondary | ICD-10-CM

## 2013-07-31 DIAGNOSIS — F1110 Opioid abuse, uncomplicated: Secondary | ICD-10-CM

## 2013-07-31 DIAGNOSIS — F1190 Opioid use, unspecified, uncomplicated: Secondary | ICD-10-CM

## 2013-07-31 DIAGNOSIS — K219 Gastro-esophageal reflux disease without esophagitis: Secondary | ICD-10-CM

## 2013-07-31 DIAGNOSIS — L989 Disorder of the skin and subcutaneous tissue, unspecified: Secondary | ICD-10-CM

## 2013-07-31 DIAGNOSIS — E785 Hyperlipidemia, unspecified: Secondary | ICD-10-CM

## 2013-07-31 DIAGNOSIS — Z79899 Other long term (current) drug therapy: Secondary | ICD-10-CM

## 2013-07-31 DIAGNOSIS — I2510 Atherosclerotic heart disease of native coronary artery without angina pectoris: Secondary | ICD-10-CM

## 2013-07-31 MED ORDER — HYDROCODONE-ACETAMINOPHEN 10-325 MG PO TABS: ORAL_TABLET | ORAL | Status: DC

## 2013-07-31 NOTE — Patient Instructions (Signed)
No change in meds  Get derm to check the area on right  Chest  . Reconsider flu vaccine if you change  You mind. Labs and Prevnetive visit  In [employer]  2015   Will put in orders for this get lab appt .

## 2013-07-31 NOTE — Progress Notes (Signed)
Chief Complaint  Patient presents with  . Follow-up    multiple     HPI: Patient comes in today for follow up of  multiple medical problems.  Medication management  No major change in health status since last visit .  Pain meds opiates still taking 1/4 every 5 hours or so  About 2 - 2.5 per day .   Helps sleep   And activity level. Feels bad if doesn't take it . No falling  Down , right rib catching sipping and pain when reaches a with right arm no other cp sob . Doing pilates  Scaly lesion right chest INC? Has derm he sees   Dr Daleen Squibb last card and to see Dr Jens Som. Taking pravastatin and denies se.   se urology no change.  Takes one omeprazole to help stomach and controlled  ROS: See pertinent positives and negatives per HPI. No fever bleeding   Past Medical History  Diagnosis Date  . CAD (coronary artery disease)     s/p Taxus DES to prox and mid LAD 06/2003;  LHC  1/07: LM 205, LAD stents ok., oD2 70% jailed (tx conservatively), mid to dist RCA 40-50%, EF 40-45%;  echo 2/07: EF 55-65%  . Hyperlipidemia   . COPD (chronic obstructive pulmonary disease)   . GERD (gastroesophageal reflux disease)   . Osteoarthritis   . Anemia   . Hypothyroidism   . Diabetes mellitus   . Hiatal hernia   . Hemorrhoids   . History of renal calculi   . Diverticulosis of colon   . Adenomatous colon polyp   . Dermatitis   . Arthritis   . History of prostate cancer   . Migraines   . Transfusion history     Family History  Problem Relation Age of Onset  . Hypertension Other   . Arthritis Other   . Cancer Other     History   Social History  . Marital Status: Married    Spouse Name: N/A    Number of Children: N/A  . Years of Education: N/A   Occupational History  . Retired    Social History Main Topics  . Smoking status: Former Games developer  . Smokeless tobacco: None  . Alcohol Use: No  . Drug Use: No  . Sexual Activity: None   Other Topics Concern  . None   Social History  Narrative   No regular exercise   HHof 2   Married wife no pets       Remote hx of tobacco stopped 1965   No etoh   REtired Scientist, research (physical sciences) level.    Outpatient Encounter Prescriptions as of 07/31/2013  Medication Sig Dispense Refill  . HYDROcodone-acetaminophen (NORCO) 10-325 MG per tablet TAKE 1/2 TABLET BY MOUTH EVERY 5 HOURS AS NEEDED  70 tablet  3  . metoprolol tartrate (LOPRESSOR) 25 MG tablet TAKE 1 TABLET TWICE A DAY  180 tablet  0  . Multiple Vitamins-Minerals (OCUVITE PRESERVISION) TABS Take 1 tablet by mouth 2 (two) times daily.        Marland Kitchen omeprazole (PRILOSEC OTC) 20 MG tablet Take 20 mg by mouth daily.      Marland Kitchen OVER THE COUNTER MEDICATION Take 1 tablet by mouth 2 (two) times daily. Calcium Citrate 500mg  w/ Vitamin D 800mg       . pravastatin (PRAVACHOL) 20 MG tablet Take 1 tablet (20 mg total) by mouth daily.  90 tablet  3  . pseudoephedrine (SUDAFED) 30 MG tablet Take 30  mg by mouth every 4 (four) hours as needed. Nasal decongestant      . [DISCONTINUED] HYDROcodone-acetaminophen (NORCO) 10-325 MG per tablet TAKE 1/2 TABLET BY MOUTH EVERY 5 HOURS AS NEEDED  70 tablet  1  . [DISCONTINUED] omeprazole (PRILOSEC OTC) 20 MG tablet Take 1-2 tabs daily  42 tablet  3   No facility-administered encounter medications on file as of 07/31/2013.    EXAM:  BP 138/72  Pulse 67  Temp(Src) 97.5 F (36.4 C) (Oral)  Wt 196 lb (88.905 kg)  BMI 27.35 kg/m2  SpO2 98%  Body mass index is 27.35 kg/(m^2).  GENERAL: vitals reviewed and listed above, alert, oriented, appears well hydrated and in no acute distress  HEENT: atraumatic, conjunctiva  clear, no obvious abnormalities on inspection of external nose and ears  NECK: no obvious masses on inspection palpation  No adenopathy LUNGS: clear to auscultation bilaterally, no wheezes, rales or rhonchi, good air movement CV: HRRR, no clubbing cyanosis or  peripheral edema nl cap refill   MS: moves all extremities without noticeable focal   Abnormality well healed scars  Skin numerous SKs   Right upper chest a rount scaly light colored lesion 3-4 mm  PSYCH: pleasant and cooperative, no obvious depression or anxiety Lab Results  Component Value Date   WBC 5.6 02/11/2013   HGB 14.2 02/11/2013   HCT 42.1 02/11/2013   PLT 169.0 02/11/2013   GLUCOSE 95 02/11/2013   CHOL 129 02/11/2013   TRIG 85.0 02/11/2013   HDL 40.20 02/11/2013   LDLCALC 72 02/11/2013   ALT 15 02/11/2013   AST 20 02/11/2013   NA 143 02/11/2013   K 4.3 02/11/2013   CL 107 02/11/2013   CREATININE 0.9 02/11/2013   BUN 23 02/11/2013   CO2 29 02/11/2013   TSH 1.27 02/11/2013   INR 1.09 02/25/2012    ASSESSMENT AND PLAN:  Discussed the following assessment and plan:  OSTEOARTHRITIS - Plan: omeprazole (PRILOSEC OTC) 20 MG tablet, HYDROcodone-acetaminophen (NORCO) 10-325 MG per tablet, CBC with Differential, Lipid panel, TSH, Hepatic function panel, Basic metabolic panel  Opiate use - controlled  helps pain benefit more than risk at this time  - Plan: omeprazole (PRILOSEC OTC) 20 MG tablet, HYDROcodone-acetaminophen (NORCO) 10-325 MG per tablet, CBC with Differential, Lipid panel, TSH, Hepatic function panel, Basic metabolic panel  CORONARY ARTERY DISEASE - Plan: omeprazole (PRILOSEC OTC) 20 MG tablet, HYDROcodone-acetaminophen (NORCO) 10-325 MG per tablet, CBC with Differential, Lipid panel, TSH, Hepatic function panel, Basic metabolic panel  GERD - Plan: omeprazole (PRILOSEC OTC) 20 MG tablet, HYDROcodone-acetaminophen (NORCO) 10-325 MG per tablet, CBC with Differential, Lipid panel, TSH, Hepatic function panel, Basic metabolic panel  Medication management - Plan: omeprazole (PRILOSEC OTC) 20 MG tablet, HYDROcodone-acetaminophen (NORCO) 10-325 MG per tablet, CBC with Differential, Lipid panel, TSH, Hepatic function panel, Basic metabolic panel  HYPERLIPIDEMIA - Plan: omeprazole (PRILOSEC OTC) 20 MG tablet, HYDROcodone-acetaminophen (NORCO) 10-325 MG per tablet, CBC with  Differential, Lipid panel, TSH, Hepatic function panel, Basic metabolic panel  Skin lesion - check with derm or come back for removal  - Plan: omeprazole (PRILOSEC OTC) 20 MG tablet, HYDROcodone-acetaminophen (NORCO) 10-325 MG per tablet, CBC with Differential, Lipid panel, TSH, Hepatic function panel, Basic metabolic panel  ANEMIA - Plan: omeprazole (PRILOSEC OTC) 20 MG tablet, HYDROcodone-acetaminophen (NORCO) 10-325 MG per tablet, CBC with Differential, Lipid panel, TSH, Hepatic function panel, Basic metabolic panel  GERD, Chronic - Plan: omeprazole (PRILOSEC OTC) 20 MG tablet, HYDROcodone-acetaminophen (NORCO) 10-325 MG per tablet,  CBC with Differential, Lipid panel, TSH, Hepatic function panel, Basic metabolic panel  OSTEOARTHRITIS, Chronic - Plan: omeprazole (PRILOSEC OTC) 20 MG tablet, HYDROcodone-acetaminophen (NORCO) 10-325 MG per tablet, CBC with Differential, Lipid panel, TSH, Hepatic function panel, Basic metabolic panel Doing well at this time  Stable   Disc  meds risk etc.  Declines flu vaccine " gotta go sometime" Pt requests lab pre visit disc medicare rules etc .  -Patient advised to return or notify health care team  if symptoms worsen or persist or new concerns arise.  Patient Instructions  No change in meds  Get derm to check the area on right  Chest  . Reconsider flu vaccine if you change  You mind. Labs and Prevnetive visit  In [employer]  2015   Will put in orders for this get lab appt .      Neta Mends. Gabrianna Fassnacht M.D.  Health Maintenance  Topic Date Due  . Influenza Vaccine  01/05/2014  . Tetanus/tdap  03/05/2017  . Colonoscopy  07/19/2021  . Pneumococcal Polysaccharide Vaccine Age 46 And Over  Addressed  . Zostavax  Completed   Health Maintenance Review

## 2013-08-03 ENCOUNTER — Encounter: Payer: Self-pay | Admitting: Internal Medicine

## 2013-08-15 ENCOUNTER — Encounter (HOSPITAL_COMMUNITY): Payer: Self-pay

## 2013-08-15 ENCOUNTER — Emergency Department (HOSPITAL_COMMUNITY)
Admission: EM | Admit: 2013-08-15 | Discharge: 2013-08-15 | Disposition: A | Discharge status: Home / Self Care | Payer: Medicare Other | Attending: Emergency Medicine | Admitting: Emergency Medicine

## 2013-08-15 ENCOUNTER — Emergency Department (HOSPITAL_COMMUNITY): Payer: Medicare Other

## 2013-08-15 DIAGNOSIS — J4489 Other specified chronic obstructive pulmonary disease: Secondary | ICD-10-CM | POA: Insufficient documentation

## 2013-08-15 DIAGNOSIS — S32509A Unspecified fracture of unspecified pubis, initial encounter for closed fracture: Secondary | ICD-10-CM | POA: Insufficient documentation

## 2013-08-15 DIAGNOSIS — W1809XA Striking against other object with subsequent fall, initial encounter: Secondary | ICD-10-CM | POA: Insufficient documentation

## 2013-08-15 DIAGNOSIS — W010XXA Fall on same level from slipping, tripping and stumbling without subsequent striking against object, initial encounter: Secondary | ICD-10-CM | POA: Insufficient documentation

## 2013-08-15 DIAGNOSIS — Z87891 Personal history of nicotine dependence: Secondary | ICD-10-CM | POA: Insufficient documentation

## 2013-08-15 DIAGNOSIS — Y9389 Activity, other specified: Secondary | ICD-10-CM | POA: Insufficient documentation

## 2013-08-15 DIAGNOSIS — I2510 Atherosclerotic heart disease of native coronary artery without angina pectoris: Secondary | ICD-10-CM | POA: Insufficient documentation

## 2013-08-15 DIAGNOSIS — S0990XA Unspecified injury of head, initial encounter: Secondary | ICD-10-CM | POA: Insufficient documentation

## 2013-08-15 DIAGNOSIS — Z87442 Personal history of urinary calculi: Secondary | ICD-10-CM | POA: Insufficient documentation

## 2013-08-15 DIAGNOSIS — S32511A Fracture of superior rim of right pubis, initial encounter for closed fracture: Secondary | ICD-10-CM

## 2013-08-15 DIAGNOSIS — S8990XA Unspecified injury of unspecified lower leg, initial encounter: Secondary | ICD-10-CM | POA: Insufficient documentation

## 2013-08-15 DIAGNOSIS — Z8546 Personal history of malignant neoplasm of prostate: Secondary | ICD-10-CM | POA: Insufficient documentation

## 2013-08-15 DIAGNOSIS — E785 Hyperlipidemia, unspecified: Secondary | ICD-10-CM | POA: Insufficient documentation

## 2013-08-15 DIAGNOSIS — Z860100 Personal history of colon polyps, unspecified: Secondary | ICD-10-CM | POA: Insufficient documentation

## 2013-08-15 DIAGNOSIS — J449 Chronic obstructive pulmonary disease, unspecified: Secondary | ICD-10-CM | POA: Insufficient documentation

## 2013-08-15 DIAGNOSIS — Z872 Personal history of diseases of the skin and subcutaneous tissue: Secondary | ICD-10-CM | POA: Insufficient documentation

## 2013-08-15 DIAGNOSIS — M129 Arthropathy, unspecified: Secondary | ICD-10-CM | POA: Insufficient documentation

## 2013-08-15 DIAGNOSIS — Z79899 Other long term (current) drug therapy: Secondary | ICD-10-CM | POA: Insufficient documentation

## 2013-08-15 DIAGNOSIS — Z862 Personal history of diseases of the blood and blood-forming organs and certain disorders involving the immune mechanism: Secondary | ICD-10-CM | POA: Insufficient documentation

## 2013-08-15 DIAGNOSIS — Y92009 Unspecified place in unspecified non-institutional (private) residence as the place of occurrence of the external cause: Secondary | ICD-10-CM | POA: Insufficient documentation

## 2013-08-15 DIAGNOSIS — W19XXXA Unspecified fall, initial encounter: Secondary | ICD-10-CM

## 2013-08-15 DIAGNOSIS — K219 Gastro-esophageal reflux disease without esophagitis: Secondary | ICD-10-CM | POA: Insufficient documentation

## 2013-08-15 DIAGNOSIS — S59909A Unspecified injury of unspecified elbow, initial encounter: Secondary | ICD-10-CM | POA: Insufficient documentation

## 2013-08-15 DIAGNOSIS — S6990XA Unspecified injury of unspecified wrist, hand and finger(s), initial encounter: Secondary | ICD-10-CM | POA: Insufficient documentation

## 2013-08-15 DIAGNOSIS — M1990 Unspecified osteoarthritis, unspecified site: Secondary | ICD-10-CM | POA: Insufficient documentation

## 2013-08-15 DIAGNOSIS — Z96659 Presence of unspecified artificial knee joint: Secondary | ICD-10-CM | POA: Insufficient documentation

## 2013-08-15 MED ORDER — HYDROCODONE-ACETAMINOPHEN 5-325 MG PO TABS
1.0000 | ORAL_TABLET | Freq: Once | ORAL | Status: AC
  Administered 2013-08-15: 1 via ORAL
  Filled 2013-08-15: qty 1

## 2013-08-15 NOTE — ED Provider Notes (Signed)
CSN: [id number]     Arrival date & time 08/15/13  1733 History   First MD Initiated Contact with Patient 08/15/13 1743     Chief Complaint  Patient presents with  . Fall  . Hip Pain   HPI Pt was seen at 1750. Per pt and his wife, c/o sudden onset and resolution of one episode of slip and fall PTA. Pt states he was cleaning the garage when he "tripped over some boxes" and fell backwards to the right side. Pt states he hit his head, right elbow, right hip and knee against the ground. Denies LOC, no AMS, no neck or back pain, no prodromal symptoms before falling, no CP/palpitations, no SOB, no abd pain, no N/V/D, no visual changes, no focal motor weakness, no tingling/numbness in extremities. Pt states he was able to stand up on his own and walk, but when he walked into the house and sat down his right hip started to hurt. States he "just couldn't stand up and walk again" due to his right hip pain. Right hip pain worsens with weight bearing and ROM right hip.     Ortho Dr. Charlann Boxer Past Medical History  Diagnosis Date  . CAD (coronary artery disease)     s/p Taxus DES to prox and mid LAD 06/2003;  LHC  1/07: LM 205, LAD stents ok., oD2 70% jailed (tx conservatively), mid to dist RCA 40-50%, EF 40-45%;  echo 2/07: EF 55-65%  . Hyperlipidemia   . COPD (chronic obstructive pulmonary disease)   . GERD (gastroesophageal reflux disease)   . Osteoarthritis   . Anemia   . Hypothyroidism   . Hyperglycemia   . Hiatal hernia   . Hemorrhoids   . History of renal calculi   . Diverticulosis of colon   . Adenomatous colon polyp   . Dermatitis   . Arthritis   . History of prostate cancer   . Migraines   . Transfusion history    Past Surgical History  Procedure Laterality Date  . Total knee arthroplasty  2001/2002  . Lithotripsy  1996  . Colonoscopy  2005  . Prostatectomy    . Appendectomy    . Retinal detachment surgery    . Broken wrist    . Radiation for prostate    . Broken knee cap    .  Right knee replacement      x 2  . Left knee replacement    . Stint     Family History  Problem Relation Age of Onset  . Hypertension Other   . Arthritis Other   . Cancer Other    History  Substance Use Topics  . Smoking status: Former Games developer  . Smokeless tobacco: Never Used  . Alcohol Use: No    Review of Systems ROS: Statement: All systems negative except as marked or noted in the HPI; Constitutional: Negative for fever and chills. ; ; Eyes: Negative for eye pain, redness and discharge. ; ; ENMT: Negative for ear pain, hoarseness, nasal congestion, sinus pressure and sore throat. ; ; Cardiovascular: Negative for chest pain, palpitations, diaphoresis, dyspnea and peripheral edema. ; ; Respiratory: Negative for cough, wheezing and stridor. ; ; Gastrointestinal: Negative for nausea, vomiting, diarrhea, abdominal pain, blood in stool, hematemesis, jaundice and rectal bleeding. . ; ; Genitourinary: Negative for dysuria, flank pain and hematuria. ; ; Musculoskeletal: +head injury, right hip pain. Negative for back pain and neck pain. Negative for swelling.; ; Skin: +bruising, abrasion. Negative for pruritus, rash,  blisters, and skin lesion.; ; Neuro: Negative for headache, lightheadedness and neck stiffness. Negative for weakness, altered level of consciousness , altered mental status, extremity weakness, paresthesias, involuntary movement, seizure and syncope.       Allergies  Aspirin and Meperidine hcl  Home Medications   Current Outpatient Rx  Name  Route  Sig  Dispense  Refill  . HYDROcodone-acetaminophen (NORCO) 10-325 MG per tablet   Oral   Take 0.5 tablets by mouth every 6 (six) hours as needed for pain.         . metoprolol tartrate (LOPRESSOR) 25 MG tablet   Oral   Take 25 mg by mouth 2 (two) times daily.         . Multiple Vitamins-Minerals (OCUVITE PRESERVISION) TABS   Oral   Take 1 tablet by mouth 2 (two) times daily.           Marland Kitchen omeprazole (PRILOSEC OTC) 20  MG tablet   Oral   Take 20 mg by mouth daily.         Marland Kitchen OVER THE COUNTER MEDICATION   Oral   Take 1 tablet by mouth 2 (two) times daily. Calcium Citrate 500mg  w/ Vitamin D 800mg          . pravastatin (PRAVACHOL) 20 MG tablet   Oral   Take 20 mg by mouth every evening.         . pseudoephedrine (SUDAFED) 30 MG tablet   Oral   Take 30 mg by mouth every 4 (four) hours as needed for congestion.           BP 158/84  Pulse 58  Temp(Src) 97.6 F (36.4 C) (Oral)  Resp 18  Ht 5\' 11"  (1.803 m)  Wt 196 lb (88.905 kg)  BMI 27.35 kg/m2  SpO2 96% Physical Exam 1755: Physical examination: Vital signs and O2 SAT: Reviewed; Constitutional: Well developed, Well nourished, Well hydrated, In no acute distress; Head and Face: Normocephalic, +small hematoma to vertex scalp. No open wounds.; Eyes: EOMI, PERRL, No scleral icterus; ENMT: Mouth and pharynx normal, Left TM normal, Right TM normal, Mucous membranes moist; Neck: Supple, Trachea midline; Spine: No midline CS, TS, LS tenderness.; Cardiovascular: Regular rate and rhythm, No gallop; Respiratory: Breath sounds clear & equal bilaterally, No rales, rhonchi, wheezes, Normal respiratory effort/excursion; Chest: Nontender, No deformity, Movement normal, No crepitus, No abrasions or ecchymosis.; Abdomen: Soft, Nontender, Nondistended, Normal bowel sounds, No abrasions or ecchymosis.; Genitourinary: No CVA tenderness;; Extremities: No deformity, +right hip TTP esp with ROM; otherwise full range of motion major/large joints of bilat UE's and LE's without pain or tenderness to palp, Neurovascularly intact, Pulses normal, +small hematoma and superificial abrasion to right elbow, NT to palp without edema or deformity. Well healed surgical scars bilat knees. NT right knee/ankle/foot. No edema, Pelvis stable; Neuro: AA&Ox3, GCS 15.  Major CN grossly intact. Speech clear. No gross focal motor or sensory deficits in extremities.; Skin: Color normal, Warm,  Dry   ED Course  Procedures      MDM  MDM Reviewed: previous chart, nursing note and vitals Interpretation: x-ray and CT scan    Dg Elbow Complete Right 08/15/2013   CLINICAL DATA:  Posterior right elbow pain following a fall today.  EXAM: RIGHT ELBOW - COMPLETE 3+ VIEW  COMPARISON:  None.  FINDINGS: There is no evidence of fracture, dislocation, or joint effusion. There is no evidence of arthropathy or other focal bone abnormality. Soft tissues are unremarkable  IMPRESSION: Normal examination.  Electronically Signed   By: Gordan Payment   On: 08/15/2013 18:26   Dg Hip Complete Right 08/15/2013   *RADIOLOGY REPORT*  Clinical Data: Fall.  Hip pain  RIGHT HIP - COMPLETE 2+ VIEW  Comparison: None  Findings: There are fractures involving the right superior  pubic rami. No hip fracture or dislocation.  The bones are osteopenic and the patient is status post bilateral pelvic lymph node dissection.  IMPRESSION:  1.  Acute fractures involve the right superior pubic rami.   Original Report Authenticated By: Signa Kell, M.D.   Ct Head Wo Contrast 08/15/2013   *RADIOLOGY REPORT*  Clinical Data: Fall.  Head injury.  CT HEAD WITHOUT CONTRAST  Technique:  Contiguous axial images were obtained from the base of the skull through the vertex without contrast.  Comparison: None.  Findings: Soft tissue swelling right parietal convexity.  No underlying fracture or intracranial hemorrhage.  Small vessel disease type changes without CT evidence of large acute infarct.  Global atrophy without hydrocephalus.  No intracranial mass lesion detected on this unenhanced exam.  Prior banding right globe.  Frontal sinus and ethmoid sinus air cell mucosal thickening.  IMPRESSION: Soft tissue swelling right parietal convexity.  No underlying fracture or intracranial hemorrhage.  Small vessel disease type changes without CT evidence of large acute infarct.  Global atrophy without hydrocephalus.  Frontal sinus and ethmoid sinus air  cell mucosal thickening   Original Report Authenticated By: Lacy Duverney, M.D.   Dg Knee Complete 4 Views Right 08/15/2013   CLINICAL DATA:  Right knee pain following a fall today.  EXAM: RIGHT KNEE - COMPLETE 4+ VIEW  COMPARISON:  None.  FINDINGS: Right total knee prosthesis. Small to moderate-sized effusion. No fracture or dislocation seen.  IMPRESSION: No fracture or dislocation. Small to moderate-sized effusion.   Electronically Signed   By: Gordan Payment   On: 08/15/2013 18:24     2115:  Pt given vicodin and ambulated with walker. States he "can manage at home" because he "has all the stuff to help me out from when I had my knees replaced."  States he has enough vicodin at home and does not need a rx. T/C to Ortho Dr. Rennis Chris, case discussed, including:  HPI, pertinent PM/SHx, VS/PE, dx testing, ED course and treatment:  Pain control, WBAT with walker, f/u office with Dr. Charlann Boxer, will likely need repeat xray in approx 1 month. Dx and testing, as well as d/w Ortho MD, d/w pt and family.  Questions answered.  Verb understanding, agreeable to d/c home with outpt f/u.        Laray Anger, DO 08/19/13 [phone]

## 2013-08-15 NOTE — ED Notes (Signed)
Patient states he was cleaning the garage and tripped. patiaent fell. Hitting his head onc concrete. Patient has a hematoma. patient denies taking any blood thinners. Patient also c/o right hip pain and has increased pain with weight bearing.

## 2013-08-15 NOTE — ED Notes (Signed)
Ambulated pt with walker, pt stated that" it only hurts to walk and lil tender".

## 2013-08-22 ENCOUNTER — Ambulatory Visit: Payer: Medicare Other | Admitting: Cardiology

## 2013-08-25 ENCOUNTER — Other Ambulatory Visit: Payer: Self-pay | Admitting: Cardiology

## 2013-09-19 ENCOUNTER — Encounter: Payer: Self-pay | Admitting: *Deleted

## 2013-09-19 ENCOUNTER — Ambulatory Visit (INDEPENDENT_AMBULATORY_CARE_PROVIDER_SITE_OTHER): Payer: Medicare Other | Admitting: Cardiology

## 2013-09-19 VITALS — BP 122/72 | HR 69 | Wt 191.2 lb

## 2013-09-19 DIAGNOSIS — I10 Essential (primary) hypertension: Secondary | ICD-10-CM | POA: Insufficient documentation

## 2013-09-19 DIAGNOSIS — E785 Hyperlipidemia, unspecified: Secondary | ICD-10-CM

## 2013-09-19 DIAGNOSIS — I2510 Atherosclerotic heart disease of native coronary artery without angina pectoris: Secondary | ICD-10-CM

## 2013-09-19 NOTE — Patient Instructions (Signed)
Your physician wants you to follow-up in: ONE YEAR WITH DR Shelda Pal will receive a reminder letter in the mail two months in advance. If you don't receive a letter, please call our office to schedule the follow-up appointment.   STOP PRAVASTATIN

## 2013-09-19 NOTE — Assessment & Plan Note (Signed)
Patient is concerned that Pravachol is contributing to arthralgias and decreased memory. He wants to discontinue this medication. I explained the benefits of this medication and he still would like to avoid. He will discontinue this medicine for a trial period. If his symptoms do not improve I have encouraged him to resume.

## 2013-09-19 NOTE — Progress Notes (Signed)
HPI: 79 year old male previously followed by Dr. Daleen Squibb for followup of coronary artery disease. Patient had a Taxus drug-eluting stent to proximal and mid LAD in 2004. Last catheterization in 2007 showed a 70% second diagonal which was jailed. There was a 40-50% right coronary artery and LAD stents were patent. Ejection fraction 40-45%. Medical therapy recommended. Last echocardiogram in March of 2013 showed normal LV function. Nuclear study in May of 2013 showed normal perfusion. The study was not gated. There was a hypertensive response. Abdominal CT in March of 2014 showed no aneurysm. Since he was last seen there is no dyspnea, chest pain, palpitations or syncope.  Current Outpatient Prescriptions  Medication Sig Dispense Refill  . HYDROcodone-acetaminophen (NORCO) 10-325 MG per tablet Take 0.5 tablets by mouth every 6 (six) hours as needed for pain.      . metoprolol tartrate (LOPRESSOR) 25 MG tablet TAKE 1 TABLET TWICE A DAY  180 tablet  0  . Multiple Vitamins-Minerals (OCUVITE PRESERVISION) TABS Take 1 tablet by mouth 2 (two) times daily.        Marland Kitchen omeprazole (PRILOSEC OTC) 20 MG tablet Take 20 mg by mouth daily.      Marland Kitchen OVER THE COUNTER MEDICATION Take 1 tablet by mouth 2 (two) times daily. Calcium Citrate 500mg  w/ Vitamin D 800mg       . pravastatin (PRAVACHOL) 20 MG tablet Take 20 mg by mouth every evening.      . pseudoephedrine (SUDAFED) 30 MG tablet Take 30 mg by mouth every 4 (four) hours as needed for congestion.       . [DISCONTINUED] Calcium Carbonate-Vitamin D (CALTRATE 600+D) 600-400 MG-UNIT per tablet Take 1 tablet by mouth 2 (two) times daily.         No current facility-administered medications for this visit.     Past Medical History  Diagnosis Date  . CAD (coronary artery disease)     s/p Taxus DES to prox and mid LAD 06/2003;  LHC  1/07: LM 205, LAD stents ok., oD2 70% jailed (tx conservatively), mid to dist RCA 40-50%, EF 40-45%;  echo 2/07: EF 55-65%  .  Hyperlipidemia   . COPD (chronic obstructive pulmonary disease)   . GERD (gastroesophageal reflux disease)   . Osteoarthritis   . Anemia   . Hypothyroidism   . Hyperglycemia   . Hiatal hernia   . Hemorrhoids   . History of renal calculi   . Diverticulosis of colon   . Adenomatous colon polyp   . Dermatitis   . Arthritis   . History of prostate cancer   . Migraines   . Transfusion history     Past Surgical History  Procedure Laterality Date  . Total knee arthroplasty  2001/2002  . Lithotripsy  1996  . Colonoscopy  2005  . Prostatectomy    . Appendectomy    . Retinal detachment surgery    . Broken wrist    . Radiation for prostate    . Broken knee cap    . Right knee replacement      x 2  . Left knee replacement    . Stint      History   Social History  . Marital Status: Married    Spouse Name: N/A    Number of Children: N/A  . Years of Education: N/A   Occupational History  . Retired    Social History Main Topics  . Smoking status: Former Games developer  . Smokeless tobacco: Never Used  .  Alcohol Use: No  . Drug Use: No  . Sexual Activity: Not on file   Other Topics Concern  . Not on file   Social History Narrative   No regular exercise   HHof 2   Married wife no pets       Remote hx of tobacco stopped 1965   No etoh   REtired Scientist, research (physical sciences) level.    ROS: arthralgias but no fevers or chills, productive cough, hemoptysis, dysphasia, odynophagia, melena, hematochezia, dysuria, hematuria, rash, seizure activity, orthopnea, PND, pedal edema, claudication. Remaining systems are negative.  Physical Exam: Well-developed well-nourished in no acute distress.  Skin is warm and dry.  HEENT is normal.  Neck is supple.  Chest is clear to auscultation with normal expansion.  Cardiovascular exam is regular rate and rhythm.  Abdominal exam nontender or distended. No masses palpated. Extremities show no edema. neuro grossly intact  ECG sinus rhythm at a rate  of 69. No ST changes.

## 2013-09-19 NOTE — Assessment & Plan Note (Signed)
Blood pressure controlled.continue present medications. 

## 2013-09-19 NOTE — Assessment & Plan Note (Signed)
Patient not on aspirin as it has caused significant hematuria previously. This is related to previous prostate surgery.

## 2013-10-28 ENCOUNTER — Telehealth: Payer: Self-pay | Admitting: Internal Medicine

## 2013-10-28 ENCOUNTER — Emergency Department (HOSPITAL_COMMUNITY)
Admission: EM | Admit: 2013-10-28 | Discharge: 2013-10-28 | Disposition: A | Discharge status: Home / Self Care | Payer: Medicare Other | Attending: Emergency Medicine | Admitting: Emergency Medicine

## 2013-10-28 ENCOUNTER — Emergency Department (HOSPITAL_COMMUNITY): Payer: Medicare Other

## 2013-10-28 ENCOUNTER — Encounter (HOSPITAL_COMMUNITY): Payer: Self-pay | Admitting: Emergency Medicine

## 2013-10-28 DIAGNOSIS — Z862 Personal history of diseases of the blood and blood-forming organs and certain disorders involving the immune mechanism: Secondary | ICD-10-CM | POA: Insufficient documentation

## 2013-10-28 DIAGNOSIS — Z79899 Other long term (current) drug therapy: Secondary | ICD-10-CM | POA: Insufficient documentation

## 2013-10-28 DIAGNOSIS — S32591G Other specified fracture of right pubis, subsequent encounter for fracture with delayed healing: Secondary | ICD-10-CM

## 2013-10-28 DIAGNOSIS — Z872 Personal history of diseases of the skin and subcutaneous tissue: Secondary | ICD-10-CM | POA: Insufficient documentation

## 2013-10-28 DIAGNOSIS — Z8639 Personal history of other endocrine, nutritional and metabolic disease: Secondary | ICD-10-CM | POA: Insufficient documentation

## 2013-10-28 DIAGNOSIS — Z87442 Personal history of urinary calculi: Secondary | ICD-10-CM | POA: Insufficient documentation

## 2013-10-28 DIAGNOSIS — I2510 Atherosclerotic heart disease of native coronary artery without angina pectoris: Secondary | ICD-10-CM | POA: Insufficient documentation

## 2013-10-28 DIAGNOSIS — Z923 Personal history of irradiation: Secondary | ICD-10-CM | POA: Insufficient documentation

## 2013-10-28 DIAGNOSIS — Z87891 Personal history of nicotine dependence: Secondary | ICD-10-CM | POA: Insufficient documentation

## 2013-10-28 DIAGNOSIS — Z860100 Personal history of colon polyps, unspecified: Secondary | ICD-10-CM | POA: Insufficient documentation

## 2013-10-28 DIAGNOSIS — J449 Chronic obstructive pulmonary disease, unspecified: Secondary | ICD-10-CM | POA: Insufficient documentation

## 2013-10-28 DIAGNOSIS — M1990 Unspecified osteoarthritis, unspecified site: Secondary | ICD-10-CM | POA: Insufficient documentation

## 2013-10-28 DIAGNOSIS — J4489 Other specified chronic obstructive pulmonary disease: Secondary | ICD-10-CM | POA: Insufficient documentation

## 2013-10-28 DIAGNOSIS — K219 Gastro-esophageal reflux disease without esophagitis: Secondary | ICD-10-CM | POA: Insufficient documentation

## 2013-10-28 DIAGNOSIS — Z8546 Personal history of malignant neoplasm of prostate: Secondary | ICD-10-CM | POA: Insufficient documentation

## 2013-10-28 DIAGNOSIS — S72009D Fracture of unspecified part of neck of unspecified femur, subsequent encounter for closed fracture with routine healing: Secondary | ICD-10-CM | POA: Insufficient documentation

## 2013-10-28 DIAGNOSIS — Z8719 Personal history of other diseases of the digestive system: Secondary | ICD-10-CM | POA: Insufficient documentation

## 2013-10-28 LAB — COMPREHENSIVE METABOLIC PANEL
ALT: 11 U/L (ref 0–53)
AST: 13 U/L (ref 0–37)
Albumin: 3.4 g/dL — ABNORMAL LOW (ref 3.5–5.2)
CO2: 27 mEq/L (ref 19–32)
Chloride: 105 mEq/L (ref 96–112)
Creatinine, Ser: 0.76 mg/dL (ref 0.50–1.35)
GFR calc non Af Amer: 83 mL/min — ABNORMAL LOW (ref >90)
Potassium: 4 mEq/L (ref 3.5–5.1)
Sodium: 140 mEq/L (ref 135–145)
Total Bilirubin: 0.3 mg/dL (ref 0.3–1.2)

## 2013-10-28 LAB — URINALYSIS, ROUTINE W REFLEX MICROSCOPIC
Glucose, UA: NEGATIVE mg/dL
Ketones, ur: NEGATIVE mg/dL
Leukocytes, UA: NEGATIVE
Nitrite: NEGATIVE
Protein, ur: NEGATIVE mg/dL
Urobilinogen, UA: 0.2 mg/dL (ref 0.0–1.0)

## 2013-10-28 LAB — CBC WITH DIFFERENTIAL/PLATELET
Basophils Absolute: 0 10*3/uL (ref 0.0–0.1)
Basophils Relative: 0 % (ref 0–1)
HCT: 39.3 % (ref 39.0–52.0)
Lymphocytes Relative: 24 % (ref 12–46)
MCHC: 33.6 g/dL (ref 30.0–36.0)
Monocytes Absolute: 0.5 10*3/uL (ref 0.1–1.0)
Neutro Abs: 4.1 10*3/uL (ref 1.7–7.7)
Neutrophils Relative %: 64 % (ref 43–77)
Platelets: 166 10*3/uL (ref 150–400)
RDW: 13.8 % (ref 11.5–15.5)
WBC: 6.5 10*3/uL (ref 4.0–10.5)

## 2013-10-28 MED ORDER — TRAMADOL HCL 50 MG PO TABS: 50.0000 mg | ORAL_TABLET | Freq: Four times a day (QID) | ORAL | Status: DC | PRN

## 2013-10-28 NOTE — Telephone Encounter (Signed)
Patient Information:  Caller Name: Para March  Phone: [phone]  Patient: [name], [name]  Gender: Male  DOB: 07-24-1930  Age: 79 Years  PCP: Berniece Andreas (Family Practice)  Office Follow Up:  Does the office need to follow up with this patient?: No  Instructions For The Office: N/A  RN Note:  Advised wife to take pt to [facility]. Wife to take him now.  Symptoms  Reason For Call & Symptoms: Wife and pt calling regarding pt's private parts are swollen and tender, pt has had nausea for 1 week and pt has been released from therapy for broken pelvis from fall on 08/15/13. Pt is urinating fine.  Reviewed Health History In EMR: Yes  Reviewed Medications In EMR: Yes  Reviewed Allergies In EMR: Yes  Reviewed Surgeries / Procedures: Yes  Date of Onset of Symptoms: 10/21/2013  Guideline(s) Used:  Genital Injury - Male  Disposition Per Guideline:   Go to ED Now  Reason For Disposition Reached:   Moderate-Severe pain in penis, scrotum, or testicle lasts > 1 hour  Advice Given:  N/A  Patient Will Follow Care Advice:  YES

## 2013-10-28 NOTE — ED Notes (Signed)
Pt speaking with Resident MD at this time; family at bedside

## 2013-10-28 NOTE — ED Notes (Addendum)
Fell on 9/11 and cracked his pelvis  And today his testicles are tender/ discolored and they hurt and he is weak   Called his dr and was told to come  To er for ?  Infection has been having chills at night

## 2013-10-28 NOTE — ED Provider Notes (Signed)
CSN: [id number]     Arrival date & time 10/28/13  1241 History   First MD Initiated Contact with Patient 10/28/13 1459     No chief complaint on file.  (Consider location/radiation/quality/duration/timing/severity/associated sxs/prior Treatment) HPI [name] is a 79 y.o. male who presents to the emergency department with pain in his groin.  Patient reports that he sustained a fall on 9/11 where he fractured his pelvis in a couple of places.  Since then he has had pain in his groin area.  Initially more severe but has slowly been getting better.  Bruising initially but this has now completely resolved.  He has followed up with orthopedics as scheduled and has been taking patches as prescribed for the pain.  He is now able to ambulate but the pain has continued.  Pain now moderate in severity.  Located in L and R groin.  No radiation.  Worse with ambulation.  Better with nothing.  No other symptoms.  Past Medical History  Diagnosis Date  . CAD (coronary artery disease)     s/p Taxus DES to prox and mid LAD 06/2003;  LHC  1/07: LM 205, LAD stents ok., oD2 70% jailed (tx conservatively), mid to dist RCA 40-50%, EF 40-45%;  echo 2/07: EF 55-65%  . Hyperlipidemia   . COPD (chronic obstructive pulmonary disease)   . GERD (gastroesophageal reflux disease)   . Osteoarthritis   . Anemia   . Hypothyroidism   . Hyperglycemia   . Hiatal hernia   . Hemorrhoids   . History of renal calculi   . Diverticulosis of colon   . Adenomatous colon polyp   . Dermatitis   . Arthritis   . History of prostate cancer   . Migraines   . Transfusion history    Past Surgical History  Procedure Laterality Date  . Total knee arthroplasty  2001/2002  . Lithotripsy  1996  . Colonoscopy  2005  . Prostatectomy    . Appendectomy    . Retinal detachment surgery    . Broken wrist    . Radiation for prostate    . Broken knee cap    . Right knee replacement      x 2  . Left knee replacement    . Stint      Family History  Problem Relation Age of Onset  . Hypertension Other   . Arthritis Other   . Cancer Other    History  Substance Use Topics  . Smoking status: Former Games developer  . Smokeless tobacco: Never Used  . Alcohol Use: No    Review of Systems  Constitutional: Negative for fever and chills.  HENT: Negative for congestion and sore throat.   Respiratory: Negative for cough.   Gastrointestinal: Negative for nausea, vomiting, abdominal pain, diarrhea and constipation.  Endocrine: Negative for polyuria.  Genitourinary: Negative for dysuria and hematuria.  Musculoskeletal: Negative for neck pain.  Skin: Negative for rash.  Neurological: Negative for headaches.  Psychiatric/Behavioral: Negative.   All other systems reviewed and are negative.    Allergies  Aspirin and Meperidine hcl  Home Medications   Current Outpatient Rx  Name  Route  Sig  Dispense  Refill  . HYDROcodone-acetaminophen (NORCO) 10-325 MG per tablet   Oral   Take 0.5 tablets by mouth every 6 (six) hours as needed for pain.         . metoprolol tartrate (LOPRESSOR) 25 MG tablet      TAKE 1 TABLET TWICE A  DAY   180 tablet   0     .Marland KitchenPatient needs to contact office to schedule  App ...   . Multiple Vitamins-Minerals (OCUVITE PRESERVISION) TABS   Oral   Take 1 tablet by mouth 2 (two) times daily.           Marland Kitchen omeprazole (PRILOSEC OTC) 20 MG tablet   Oral   Take 20 mg by mouth daily.         Marland Kitchen OVER THE COUNTER MEDICATION   Oral   Take 1 tablet by mouth 2 (two) times daily. Calcium Citrate 500mg  w/ Vitamin D 800mg          . pseudoephedrine (SUDAFED) 30 MG tablet   Oral   Take 30 mg by mouth every 4 (four) hours as needed for congestion.           BP 158/84  Temp(Src) 98.3 F (36.8 C)  Resp 16  Wt 198 lb (89.812 kg)  SpO2 95% Physical Exam  Nursing note and vitals reviewed. Constitutional: He is oriented to person, place, and time. He appears well-developed and well-nourished. No  distress.  HENT:  Head: Normocephalic and atraumatic.  Right Ear: External ear normal.  Left Ear: External ear normal.  Mouth/Throat: Oropharynx is clear and moist. No oropharyngeal exudate.  Eyes: Conjunctivae are normal. Pupils are equal, round, and reactive to light. Right eye exhibits no discharge.  Neck: Normal range of motion. Neck supple. No tracheal deviation present.  Cardiovascular: Normal rate, regular rhythm and intact distal pulses.   Pulmonary/Chest: Effort normal. No respiratory distress. He has no wheezes. He has no rales.  Abdominal: Soft. He exhibits no distension. There is no tenderness. There is no rebound and no guarding. Hernia confirmed negative in the right inguinal area and confirmed negative in the left inguinal area.  Genitourinary: Testes normal and penis normal. Cremasteric reflex is present. Right testis shows no mass and no tenderness. Left testis shows no mass and no tenderness.  TTP when palpating deep in inguinal canal around pelvis on R.  Musculoskeletal: Normal range of motion.  Neurological: He is alert and oriented to person, place, and time.  Skin: Skin is warm and dry. No rash noted. He is not diaphoretic.  Psychiatric: He has a normal mood and affect.    ED Course  Procedures (including critical care time) Labs Review Labs Reviewed  COMPREHENSIVE METABOLIC PANEL - Abnormal; Notable for the following:    Glucose, Bld 100 (*)    Albumin 3.4 (*)    Alkaline Phosphatase 122 (*)    GFR calc non Af Amer 83 (*)    All other components within normal limits  URINALYSIS, ROUTINE W REFLEX MICROSCOPIC  CBC WITH DIFFERENTIAL   Imaging Review Dg Pelvis 1-2 Views  10/28/2013   CLINICAL DATA:  Chronic persistent pain following pelvic fracture in Septemberi  EXAM: PELVIS - 1-2 VIEW  COMPARISON:  None.  FINDINGS: There is an ununited fracture of the superior pubic ramus on the right. No definite inferior pubic ramus fracture is demonstrated. The pelvis is  diffusely osteopenic. There is narrowing of both hip joints to a moderate degree. The SI joints are grossly normal for age. There are numerous surgical clips within the pelvis. The femoral heads and necks and intertrochanteric regions reveal no acute or healing fracture.  IMPRESSION: There is an ununited fracture of the superior pubic ramus on the right. I do not have an E study from September 2014 with which to compare.  Electronically Signed   By: David  [place]   On: 10/28/2013 16:03    EKG Interpretation   None       MDM   1. Pubic ramus fracture, right, with delayed healing, subsequent encounter    DWAYN MORAVEK is a 79 y.o. male with history of pubic ramus fracture 2 months ago who comes in with continued pain.  No evidence of testicle problems on exam.  UA negative for infection.  Pelvis film showing united R pubic ramus fracture.  Spoke with Dr. Charlann Boxer who reviewed films and recommended taking it easy, pain control, and discharge if patient is able.  Patient examined and ambulating easily unassisted.  Safe for discharge home.  Will change pain medications from Norco to Tramadol as it sounds that the hydrocodone is causing nausea.  Patient discharged.  Patient already with scheduled f/u with orthopedics next week.    Arloa Koh, MD 10/28/13 [phone]

## 2013-10-28 NOTE — ED Provider Notes (Signed)
79 year old male suffered a right pubic ramus fracture in September. Since then, pain has shifted from the right side to the left side and has now going into his testicles. Pain is relieved by narcotic analgesics but nothing else. He he has had chills and night sweats but no fever or sweats. He has had nausea but no vomiting. He is not having any dysuria. There's been no testicular swelling. He was sent to the ED because his orthopedic doctor was concerned about possibility of infection. On exam, lungs are clear, heart has regular rate and rhythm, there is no CVA tenderness. Abdomen is soft and nontender. There is tenderness palpation over the anterior pelvis and which is worse around the symphysis. Genitalia shows testes normal size and distended although they are moderately tender. There is no induration or swelling. Urinalysis has come back normal as well as CBC. Metabolic panel is significant for elevated alkaline phosphatase which is consistent with a non-healing fractures. Pelvis x-ray is pending at this time. Old records are reviewed and he was seen in September for fall with fracture of the right pubic rami.  X-ray shows 90 of the fracture of the superior pubic ramus and virtually no callus present. However, this is still a nonsurgical fracture and will need to be treated symptomatically.  Results for orders placed during the hospital encounter of 10/28/13  URINALYSIS, ROUTINE W REFLEX MICROSCOPIC      Result Value Range   Color, Urine YELLOW  YELLOW   APPearance CLEAR  CLEAR   Specific Gravity, Urine 1.015  1.005 - 1.030   pH 7.0  5.0 - 8.0   Glucose, UA NEGATIVE  NEGATIVE mg/dL   Hgb urine dipstick NEGATIVE  NEGATIVE   Bilirubin Urine NEGATIVE  NEGATIVE   Ketones, ur NEGATIVE  NEGATIVE mg/dL   Protein, ur NEGATIVE  NEGATIVE mg/dL   Urobilinogen, UA 0.2  0.0 - 1.0 mg/dL   Nitrite NEGATIVE  NEGATIVE   Leukocytes, UA NEGATIVE  NEGATIVE  CBC WITH DIFFERENTIAL      Result Value Range   WBC  6.5  4.0 - 10.5 K/uL   RBC 4.45  4.22 - 5.81 MIL/uL   Hemoglobin 13.2  13.0 - 17.0 g/dL   HCT 40.9  81.1 - 91.4 %   MCV 88.3  78.0 - 100.0 fL   MCH 29.7  26.0 - 34.0 pg   MCHC 33.6  30.0 - 36.0 g/dL   RDW 78.2  95.6 - 21.3 %   Platelets 166  150 - 400 K/uL   Neutrophils Relative % 64  43 - 77 %   Neutro Abs 4.1  1.7 - 7.7 K/uL   Lymphocytes Relative 24  12 - 46 %   Lymphs Abs 1.5  0.7 - 4.0 K/uL   Monocytes Relative 7  3 - 12 %   Monocytes Absolute 0.5  0.1 - 1.0 K/uL   Eosinophils Relative 5  0 - 5 %   Eosinophils Absolute 0.3  0.0 - 0.7 K/uL   Basophils Relative 0  0 - 1 %   Basophils Absolute 0.0  0.0 - 0.1 K/uL  COMPREHENSIVE METABOLIC PANEL      Result Value Range   Sodium 140  135 - 145 mEq/L   Potassium 4.0  3.5 - 5.1 mEq/L   Chloride 105  96 - 112 mEq/L   CO2 27  19 - 32 mEq/L   Glucose, Bld 100 (*) 70 - 99 mg/dL   BUN 21  6 - 23  mg/dL   Creatinine, Ser 1.61  0.50 - 1.35 mg/dL   Calcium 9.3  8.4 - 09.6 mg/dL   Total Protein 6.1  6.0 - 8.3 g/dL   Albumin 3.4 (*) 3.5 - 5.2 g/dL   AST 13  0 - 37 U/L   ALT 11  0 - 53 U/L   Alkaline Phosphatase 122 (*) 39 - 117 U/L   Total Bilirubin 0.3  0.3 - 1.2 mg/dL   GFR calc non Af Amer 83 (*) >90 mL/min   GFR calc Af Amer >90  >90 mL/min   Dg Pelvis 1-2 Views  10/28/2013   CLINICAL DATA:  Chronic persistent pain following pelvic fracture in Septemberi  EXAM: PELVIS - 1-2 VIEW  COMPARISON:  None.  FINDINGS: There is an ununited fracture of the superior pubic ramus on the right. No definite inferior pubic ramus fracture is demonstrated. The pelvis is diffusely osteopenic. There is narrowing of both hip joints to a moderate degree. The SI joints are grossly normal for age. There are numerous surgical clips within the pelvis. The femoral heads and necks and intertrochanteric regions reveal no acute or healing fracture.  IMPRESSION: There is an ununited fracture of the superior pubic ramus on the right. I do not have an E study from  September 2014 with which to compare.   Electronically Signed   By: Chanze Teagle  [place]   On: 10/28/2013 16:03   Images viewed by me.   I saw and evaluated the patient, reviewed the resident's note and I agree with the findings and plan.   Dione Booze, MD 10/29/13 [phone]

## 2013-10-28 NOTE — ED Notes (Signed)
Has been treated for pelvis fx with heat on groin area through physical therapy. Now has pain in testicles -- states "feels tender, having difficulty starting stream and urinating."

## 2013-10-28 NOTE — Telephone Encounter (Signed)
Noted  

## 2013-11-04 ENCOUNTER — Other Ambulatory Visit: Payer: Self-pay | Admitting: Cardiology

## 2013-12-09 ENCOUNTER — Telehealth: Payer: Self-pay | Admitting: Internal Medicine

## 2013-12-09 NOTE — Telephone Encounter (Signed)
Pt would  refill HYDROcodone-acetaminophen (NORCO) 10-325 MG per tablet

## 2013-12-10 NOTE — Telephone Encounter (Signed)
Last seen and filled on 07/31/13 # 55 with 3 additional refills Has a scheduled CPE scheduled for 02/19/14 Please advise.  Thanks!

## 2013-12-11 ENCOUNTER — Other Ambulatory Visit: Payer: Self-pay | Admitting: Family Medicine

## 2013-12-11 MED ORDER — HYDROCODONE-ACETAMINOPHEN 10-325 MG PO TABS: ORAL_TABLET | ORAL | Status: DC

## 2013-12-11 NOTE — Telephone Encounter (Signed)
Pt following up on request. pls advise Pt would ike a call back today pls! Pt states ultram did not help a thing.

## 2013-12-11 NOTE — Telephone Encounter (Signed)
Pt had a fall and pelvic fracture in the fall seen in ed and given som pain meds . Also? Seeing ortho DR [name]  Please contact pt and dr Honor Loh office to get information  and faxed records of rx and pain management issues.  Ultram is on his med list ? From ed ? Is he still taking this?

## 2013-12-11 NOTE — Telephone Encounter (Signed)
Per pt, went to ED for fall that happened on 08/15/13.  Did not feel like he was getting better so he went back to ED on 10/28/2013.  He states that he seen Dr. Noralee Chars in Sept for this problem but not recently.  Denied taking tramadol.  Stated he is getting better.

## 2013-12-11 NOTE — Telephone Encounter (Signed)
Printed copy given to medical records to have Dr. Aurea Graff office fax over records.

## 2014-01-01 ENCOUNTER — Other Ambulatory Visit: Payer: Self-pay | Admitting: Cardiovascular Disease

## 2014-01-13 ENCOUNTER — Telehealth: Payer: Self-pay | Admitting: Internal Medicine

## 2014-01-13 NOTE — Telephone Encounter (Signed)
Last filled on 12/11/13 #70 with 0 additional refills Has a future CPE scheduled for 02/19/14 Last seen on 07/31/13 Please advise. Thanks!

## 2014-01-13 NOTE — Telephone Encounter (Signed)
Ok to refill x 1  Contract and screen if not done yet.

## 2014-01-13 NOTE — Telephone Encounter (Signed)
Pt needs new rx hydrocodone °

## 2014-01-14 ENCOUNTER — Other Ambulatory Visit: Payer: Self-pay | Admitting: Family Medicine

## 2014-01-14 MED ORDER — HYDROCODONE-ACETAMINOPHEN 10-325 MG PO TABS: ORAL_TABLET | ORAL | Status: DC

## 2014-01-14 NOTE — Telephone Encounter (Signed)
Left message at the below listed number for the pt to pick up rx at the front desk.

## 2014-02-07 ENCOUNTER — Other Ambulatory Visit: Payer: Self-pay

## 2014-02-12 ENCOUNTER — Other Ambulatory Visit: Payer: Medicare Other

## 2014-02-19 ENCOUNTER — Encounter: Payer: Medicare Other | Admitting: Internal Medicine

## 2014-02-24 ENCOUNTER — Telehealth: Payer: Self-pay | Admitting: Internal Medicine

## 2014-02-24 NOTE — Telephone Encounter (Signed)
Pt needs new rx hydrocodone °

## 2014-02-25 ENCOUNTER — Telehealth: Payer: Self-pay | Admitting: Internal Medicine

## 2014-02-25 MED ORDER — HYDROCODONE-ACETAMINOPHEN 10-325 MG PO TABS: ORAL_TABLET | ORAL | Status: DC

## 2014-02-25 NOTE — Telephone Encounter (Signed)
Pt notified to pick up at the front desk. 

## 2014-02-25 NOTE — Telephone Encounter (Signed)
Last filled on 01/14/14 #70 with 0 additional refills Last seen on 07/31/13 Has a future CPE scheduled for 04/20/14. Please advise.  Thanks!

## 2014-02-25 NOTE — Telephone Encounter (Signed)
James from [id number] - [place], [address] calling to stated rx for HYDROcodone-acetaminophen ([place]) 10-325 MG per tablet can not be phone it, pt must pick up hard copy of script in office.

## 2014-02-25 NOTE — Telephone Encounter (Signed)
Ok t rx x 1

## 2014-02-25 NOTE — Telephone Encounter (Signed)
Called to the pharmacy and left on voicemail. 

## 2014-02-27 ENCOUNTER — Encounter: Payer: Self-pay | Admitting: Internal Medicine

## 2014-04-02 ENCOUNTER — Telehealth: Payer: Self-pay | Admitting: Internal Medicine

## 2014-04-02 NOTE — Telephone Encounter (Signed)
Pt req rx HYDROcodone-acetaminophen (NORCO) 10-325 MG per tablet ° °

## 2014-04-04 NOTE — Telephone Encounter (Signed)
Ok to refill x 1  

## 2014-04-07 MED ORDER — HYDROCODONE-ACETAMINOPHEN 10-325 MG PO TABS
ORAL_TABLET | ORAL | Status: DC
Start: 2014-04-07 — End: 2014-04-30

## 2014-04-07 NOTE — Telephone Encounter (Signed)
Printed for the pt.  He was notified to pick up at the front desk.

## 2014-04-21 ENCOUNTER — Other Ambulatory Visit (INDEPENDENT_AMBULATORY_CARE_PROVIDER_SITE_OTHER): Payer: Medicare Other

## 2014-04-21 DIAGNOSIS — F1110 Opioid abuse, uncomplicated: Secondary | ICD-10-CM

## 2014-04-21 DIAGNOSIS — I2510 Atherosclerotic heart disease of native coronary artery without angina pectoris: Secondary | ICD-10-CM

## 2014-04-21 DIAGNOSIS — F1190 Opioid use, unspecified, uncomplicated: Secondary | ICD-10-CM

## 2014-04-21 DIAGNOSIS — K219 Gastro-esophageal reflux disease without esophagitis: Secondary | ICD-10-CM

## 2014-04-21 DIAGNOSIS — Z79899 Other long term (current) drug therapy: Secondary | ICD-10-CM

## 2014-04-21 DIAGNOSIS — E785 Hyperlipidemia, unspecified: Secondary | ICD-10-CM

## 2014-04-21 DIAGNOSIS — L989 Disorder of the skin and subcutaneous tissue, unspecified: Secondary | ICD-10-CM

## 2014-04-21 DIAGNOSIS — M1990 Unspecified osteoarthritis, unspecified site: Secondary | ICD-10-CM

## 2014-04-21 DIAGNOSIS — D649 Anemia, unspecified: Secondary | ICD-10-CM

## 2014-04-21 LAB — CBC WITH DIFFERENTIAL/PLATELET
Basophils Absolute: 0 10*3/uL (ref 0.0–0.1)
Basophils Relative: 0.4 % (ref 0.0–3.0)
EOS PCT: 4.2 % (ref 0.0–5.0)
Eosinophils Absolute: 0.2 10*3/uL (ref 0.0–0.7)
HEMATOCRIT: 43.5 % (ref 39.0–52.0)
HEMOGLOBIN: 14.3 g/dL (ref 13.0–17.0)
LYMPHS ABS: 1.3 10*3/uL (ref 0.7–4.0)
LYMPHS PCT: 23.9 % (ref 12.0–46.0)
MCHC: 32.9 g/dL (ref 30.0–36.0)
MCV: 89.4 fl (ref 78.0–100.0)
MONOS PCT: 8.2 % (ref 3.0–12.0)
Monocytes Absolute: 0.5 10*3/uL (ref 0.1–1.0)
Neutro Abs: 3.5 10*3/uL (ref 1.4–7.7)
Neutrophils Relative %: 63.3 % (ref 43.0–77.0)
PLATELETS: 184 10*3/uL (ref 150.0–400.0)
RBC: 4.87 Mil/uL (ref 4.22–5.81)
RDW: 14.7 % (ref 11.5–15.5)
WBC: 5.6 10*3/uL (ref 4.0–10.5)

## 2014-04-21 LAB — HEPATIC FUNCTION PANEL
ALBUMIN: 3.8 g/dL (ref 3.5–5.2)
ALT: 16 U/L (ref 0–53)
AST: 20 U/L (ref 0–37)
Alkaline Phosphatase: 70 U/L (ref 39–117)
BILIRUBIN TOTAL: 0.8 mg/dL (ref 0.2–1.2)
Bilirubin, Direct: 0.1 mg/dL (ref 0.0–0.3)
TOTAL PROTEIN: 6.5 g/dL (ref 6.0–8.3)

## 2014-04-21 LAB — LIPID PANEL
CHOLESTEROL: 149 mg/dL (ref 0–200)
HDL: 47.6 mg/dL (ref >39.00)
LDL CALC: 84 mg/dL (ref 0–99)
Total CHOL/HDL Ratio: 3
Triglycerides: 87 mg/dL (ref 0.0–149.0)
VLDL: 17.4 mg/dL (ref 0.0–40.0)

## 2014-04-21 LAB — BASIC METABOLIC PANEL
BUN: 23 mg/dL (ref 6–23)
CALCIUM: 9.1 mg/dL (ref 8.4–10.5)
CO2: 28 mEq/L (ref 19–32)
Chloride: 105 mEq/L (ref 96–112)
Creatinine, Ser: 1 mg/dL (ref 0.4–1.5)
GFR: 76.04 mL/min (ref >60.00)
Glucose, Bld: 90 mg/dL (ref 70–99)
POTASSIUM: 4.2 meq/L (ref 3.5–5.1)
SODIUM: 141 meq/L (ref 135–145)

## 2014-04-21 LAB — TSH: TSH: 1.58 u[IU]/mL (ref 0.35–4.50)

## 2014-04-23 ENCOUNTER — Other Ambulatory Visit: Payer: Medicare Other

## 2014-04-30 ENCOUNTER — Ambulatory Visit (INDEPENDENT_AMBULATORY_CARE_PROVIDER_SITE_OTHER): Payer: Medicare Other | Admitting: Internal Medicine

## 2014-04-30 ENCOUNTER — Encounter: Payer: Self-pay | Admitting: Internal Medicine

## 2014-04-30 VITALS — BP 160/70 | Temp 98.1°F | Ht 67.0 in | Wt 189.0 lb

## 2014-04-30 DIAGNOSIS — Z8546 Personal history of malignant neoplasm of prostate: Secondary | ICD-10-CM

## 2014-04-30 DIAGNOSIS — Z0000 Encounter for general adult medical examination without abnormal findings: Secondary | ICD-10-CM

## 2014-04-30 DIAGNOSIS — M1990 Unspecified osteoarthritis, unspecified site: Secondary | ICD-10-CM

## 2014-04-30 DIAGNOSIS — Z974 Presence of external hearing-aid: Secondary | ICD-10-CM

## 2014-04-30 DIAGNOSIS — R52 Pain, unspecified: Secondary | ICD-10-CM

## 2014-04-30 DIAGNOSIS — I2510 Atherosclerotic heart disease of native coronary artery without angina pectoris: Secondary | ICD-10-CM

## 2014-04-30 DIAGNOSIS — I10 Essential (primary) hypertension: Secondary | ICD-10-CM

## 2014-04-30 DIAGNOSIS — Z79899 Other long term (current) drug therapy: Secondary | ICD-10-CM

## 2014-04-30 DIAGNOSIS — Z9889 Other specified postprocedural states: Secondary | ICD-10-CM

## 2014-04-30 DIAGNOSIS — Z23 Encounter for immunization: Secondary | ICD-10-CM

## 2014-04-30 DIAGNOSIS — Z7189 Other specified counseling: Secondary | ICD-10-CM | POA: Insufficient documentation

## 2014-04-30 DIAGNOSIS — Z5189 Encounter for other specified aftercare: Secondary | ICD-10-CM

## 2014-04-30 MED ORDER — HYDROCODONE-ACETAMINOPHEN 10-325 MG PO TABS: ORAL_TABLET | ORAL | Status: DC

## 2014-04-30 NOTE — Patient Instructions (Addendum)
Continue the calcium and vitamin D for bone health. If think causes memory problems  Ok to stay off the cholesterol medicine. If memory gets bad and progresses then let us know and we can do more evaluation. Labs are good to day.  BP is up today  Please recheck to make sure  Back down 140 range or lower.   Fall Prevention and Home Safety Falls cause injuries and can affect all age groups. It is possible to use preventive measures to significantly decrease the likelihood of falls. There are many simple measures which can make your home safer and prevent falls. OUTDOORS  Repair cracks and edges of walkways and driveways.  Remove high doorway thresholds.  Trim shrubbery on the main path into your home.  Have good outside lighting.  Clear walkways of tools, rocks, debris, and clutter.  Check that handrails are not broken and are securely fastened. Both sides of steps should have handrails.  Have leaves, snow, and ice cleared regularly.  Use sand or salt on walkways during winter months.  In the garage, clean up grease or oil spills. BATHROOM  Install night lights.  Install grab bars by the toilet and in the tub and shower.  Use non-skid mats or decals in the tub or shower.  Place a plastic non-slip stool in the shower to sit on, if needed.  Keep floors dry and clean up all water on the floor immediately.  Remove soap buildup in the tub or shower on a regular basis.  Secure bath mats with non-slip, double-sided rug tape.  Remove throw rugs and tripping hazards from the floors. BEDROOMS  Install night lights.  Make sure a bedside light is easy to reach.  Do not use oversized bedding.  Keep a telephone by your bedside.  Have a firm chair with side arms to use for getting dressed.  Remove throw rugs and tripping hazards from the floor. KITCHEN  Keep handles on pots and pans turned toward the center of the stove. Use back burners when possible.  Clean up spills  quickly and allow time for drying.  Avoid walking on wet floors.  Avoid hot utensils and knives.  Position shelves so they are not too high or low.  Place commonly used objects within easy reach.  If necessary, use a sturdy step stool with a grab bar when reaching.  Keep electrical cables out of the way.  Do not use floor polish or wax that makes floors slippery. If you must use wax, use non-skid floor wax.  Remove throw rugs and tripping hazards from the floor. STAIRWAYS  Never leave objects on stairs.  Place handrails on both sides of stairways and use them. Fix any loose handrails. Make sure handrails on both sides of the stairways are as long as the stairs.  Check carpeting to make sure it is firmly attached along stairs. Make repairs to worn or loose carpet promptly.  Avoid placing throw rugs at the top or bottom of stairways, or properly secure the rug with carpet tape to prevent slippage. Get rid of throw rugs, if possible.  Have an electrician put in a light switch at the top and bottom of the stairs. OTHER FALL PREVENTION TIPS  Wear low-heel or rubber-soled shoes that are supportive and fit well. Wear closed toe shoes.  When using a stepladder, make sure it is fully opened and both spreaders are firmly locked. Do not climb a closed stepladder.  Add color or contrast paint or tape to  grab bars and handrails in your home. Place contrasting color strips on first and last steps.  Learn and use mobility aids as needed. Install an electrical emergency response system.  Turn on lights to avoid dark areas. Replace light bulbs that burn out immediately. Get light switches that glow.  Arrange furniture to create clear pathways. Keep furniture in the same place.  Firmly attach carpet with non-skid or double-sided tape.  Eliminate uneven floor surfaces.  Select a carpet pattern that does not visually hide the edge of steps.  Be aware of all pets. OTHER HOME SAFETY  TIPS  Set the water temperature for 120 F (48.8 C).  Keep emergency numbers on or near the telephone.  Keep smoke detectors on every level of the home and near sleeping areas. Document Released: 11/11/2002 Document Revised: 05/22/2012 Document Reviewed: 02/10/2012 [facility] Patient Information 2014 [place].

## 2014-04-30 NOTE — Progress Notes (Signed)
Chief Complaint  Patient presents with  . Medicare Wellness    HPI: Patient comes in today for Preventive Medicare wellness visit .   Last seen cards  101 4 on yearly check  Had fall 11 14 pubic rami fx  And ? Fu ? Ortho?no  Wife had MI soon after that in [place] for  3 mos daughter with RA had fx leg.   Is on chronic narcotic for many years  For  Joint pain   Still he;lping from this  And sleeps more when not taking it. doesn't feel affecting balance  Or memory   Omeprazole doesnt take asa cause of hx of blood in stool   Memory an issues  Stopped  Medication for this.  Word finding. Got a lot better and also  Reading more now.   Skin.  Sees derm  Check   Has hearing aids  Sees urology and no recurrent cancer.   Health Maintenance  Topic Date Due  . Influenza Vaccine  07/05/2014  . Tetanus/tdap  03/05/2017  . Colonoscopy  07/19/2021  . Pneumococcal Polysaccharide Vaccine Age 64 And Over  Addressed  . Zostavax  Completed   Health Maintenance Review     Hearing:  Fail  The   Vision:  No limitations at present . Last eye check UTD  Safety:  Has smoke detector and wears seat belts.  No firearms. No excess sun exposure. Sees dentist regularly.  Falls: none since fall.   Advance directive :  Reviewed    Memory:  Problematic ,better off statin no concern from her or her family.  Depression: No anhedonia unusual crying or depressive symptoms  Nutrition: Eats well balanced diet; adequate calcium and vitamin D. No swallowing chewing problems.  Injury: no major injuries in the last six months.  Other healthcare providers:  Reviewed today .  Social:  Lives with spouse married. No pets.   Preventive parameters: up-to-date  Reviewed   ADLS:   There are no problems or need for assistance  driving, feeding, obtaining food, dressing, toileting and bathing, managing money using phone. is independent.  EXERCISE/ HABITS  Per week   No tobacco    etoh   ROS:  GEN/  HEENT: No fever, significant weight changes sweats headaches vision problems hearing changes, CV/ PULM; No chest pain shortness of breath cough, syncope,edema  change in exercise tolerance. GI /GU: No adominal pain, vomiting, change in bowel habits. No blood in the stool. No significant GU symptoms. SKIN/HEME: ,no acute skin rashes suspicious lesions or bleeding. No lymphadenopathy, nodules, masses.  NEURO/ PSYCH:  No neurologic signs such as weakness numbness. No depression anxiety. IMM/ Allergy: No unusual infections.  Allergy .   REST of 12 system review negative except as per HPI   Past Medical History  Diagnosis Date  . CAD (coronary artery disease)     s/p Taxus DES to prox and mid LAD 06/2003;  LHC  1/07: LM 205, LAD stents ok., oD2 70% jailed (tx conservatively), mid to dist RCA 40-50%, EF 40-45%;  echo 2/07: EF 55-65%  . Hyperlipidemia   . COPD (chronic obstructive pulmonary disease)   . GERD (gastroesophageal reflux disease)   . Osteoarthritis   . Anemia   . Hypothyroidism   . Hyperglycemia   . Hiatal hernia   . Hemorrhoids   . History of renal calculi   . Diverticulosis of colon   . Adenomatous colon polyp   . Dermatitis   . Arthritis   .  History of prostate cancer   . Migraines   . Transfusion history     Family History  Problem Relation Age of Onset  . Hypertension Other   . Arthritis Other   . Cancer Other     History   Social History  . Marital Status: Married    Spouse Name: N/A    Number of Children: N/A  . Years of Education: N/A   Occupational History  . Retired    Social History Main Topics  . Smoking status: Former Research scientist (life sciences)  . Smokeless tobacco: Never Used     Comment: Stopped in 1986  . Alcohol Use: No  . Drug Use: No  . Sexual Activity: None   Other Topics Concern  . None   Social History Narrative   No regular exercise   HHof 2   Married wife no pets       Remote hx of tobacco stopped 1965   No etoh   REtired Investment banker, corporate  level.    Outpatient Encounter Prescriptions as of 04/30/2014  Medication Sig  . HYDROcodone-acetaminophen (NORCO) 10-325 MG per tablet Take half a tablet every 5 hours as needed  . metoprolol tartrate (LOPRESSOR) 25 MG tablet TAKE 1 TABLET TWICE A DAY  . Multiple Vitamins-Minerals (OCUVITE PRESERVISION) TABS Take 1 tablet by mouth 2 (two) times daily.    Marland Kitchen omeprazole (PRILOSEC OTC) 20 MG tablet Take 20 mg by mouth daily as needed.   Marland Kitchen OVER THE COUNTER MEDICATION Take 1 tablet by mouth 2 (two) times daily. Calcium Citrate 500mg  w/ Vitamin D 800mg   . pseudoephedrine (SUDAFED) 30 MG tablet Take 30 mg by mouth every 4 (four) hours as needed for congestion.   . [DISCONTINUED] HYDROcodone-acetaminophen (NORCO) 10-325 MG per tablet Take half a tablet every 5 hours as needed  . [DISCONTINUED] HYDROcodone-acetaminophen (NORCO) 10-325 MG per tablet Take half a tablet every 5 hours as needed  . [DISCONTINUED] traMADol (ULTRAM) 50 MG tablet Take 1 tablet (50 mg total) by mouth every 6 (six) hours as needed.    EXAM:  BP 160/70  Temp(Src) 98.1 F (36.7 C) (Oral)  Ht 5\' 7"  (1.702 m)  Wt 189 lb (85.73 kg)  BMI 29.59 kg/m2  Body mass index is 29.59 kg/(m^2).  Physical Exam: Vital signs reviewed ZOX:WRUE is a well-developed well-nourished alert cooperative   who appears stated age in no acute distress.  HEENT: normocephalic atraumatic , Eyes: PERRL EOM's full, conjunctiva clearglassess, Nares: paten,t no deformity discharge or tenderness., Ears: no deformity EAC's clearheaing aid  TMs with normal landmarks. Mouth: clear OP, no lesions, edema.  Moist mucous membranes. Dentition in adequate repair. NECK: supple without masses, thyromegaly or bruits. CHEST/PULM:  Clear to auscultation and percussion breath sounds equal no wheeze , rales or rhonchi.  CV: PMI is nondisplaced, S1 S2 no gallops, murmurs, rubs. Peripheral pulses are present without delay.No JVD .  ABDOMEN: Bowel sounds normal nontender  No  guard or rebound, no hepato splenomegal no CVA tenderness.  No hernia. Extremtities:  No clubbing cyanosis or edema, no acute joint swelling or redness no focal atrophy wellhealed scar both knees NEURO:  Oriented x3, cranial nerves 3-12 appear to be intact, no obvious focal weakness,gait within normal limits no abnormal reflexes or asymmetrical no clonus  Balance appears good today  SKIN: No acute rashes normal turgor, color, no bruising or petechiae.sun changes   PSYCH: Oriented, good eye contact, no obvious depression anxiety, cognition and judgment appear normal. LN: no cervical  axillary inguinal adenopathy No noted deficits in memory, attention, and speech. Oriented  Calculation  BP Readings from Last 3 Encounters:  04/30/14 160/70  10/28/13 143/66  09/19/13 122/72     Lab Results  Component Value Date   WBC 5.6 04/21/2014   HGB 14.3 04/21/2014   HCT 43.5 04/21/2014   PLT 184.0 04/21/2014   GLUCOSE 90 04/21/2014   CHOL 149 04/21/2014   TRIG 87.0 04/21/2014   HDL 47.60 04/21/2014   LDLCALC 84 04/21/2014   ALT 16 04/21/2014   AST 20 04/21/2014   NA 141 04/21/2014   K 4.2 04/21/2014   CL 105 04/21/2014   CREATININE 1.0 04/21/2014   BUN 23 04/21/2014   CO2 28 04/21/2014   TSH 1.58 04/21/2014   INR 1.09 02/25/2012    ASSESSMENT AND PLAN:  Discussed the following assessment and plan:  Visit for preventive health examination - fall prevention   Essential hypertension  Medication management  Medicare annual wellness visit, subsequent  Need for vaccination with 13-polyvalent pneumococcal conjugate vaccine - Plan: Pneumococcal conjugate vaccine 13-valent  CORONARY ARTERY DISEASE - off statin felt could cause memory problem   Pain management - benefit more than risk at this time caution as discussed   OSTEOARTHRITIS  PROSTATE CANCER, HX OF  Uncertain if medication caused memory issue but he seems to be doing fairly well now. Deterioration or difficulties followup and plan further  evaluation. He actually seems to be doing fairly well younger than his stated age has lots of family stress and illnesses. A pressure of today he will check it to make sure it's come back down. Patient Care Team: Burnis Medin, MD as PCP - General (Internal Medicine) Malka So, MD as Attending Physician (Urology) Milus Banister, MD (Gastroenterology) Mauri Pole, MD (Orthopedic Surgery) Lelon Perla, MD as Consulting Physician (Cardiology) dermatology  Patient Instructions  Continue the calcium and vitamin D for bone health. If think causes memory problems  Ok to stay off the cholesterol medicine. If memory gets bad and progresses then let us know and we can do more evaluation. Labs are good to day.  BP is up today  Please recheck to make sure  Back down 140 range or lower.   Fall Prevention and Home Safety Falls cause injuries and can affect all age groups. It is possible to use preventive measures to significantly decrease the likelihood of falls. There are many simple measures which can make your home safer and prevent falls. OUTDOORS  Repair cracks and edges of walkways and driveways.  Remove high doorway thresholds.  Trim shrubbery on the main path into your home.  Have good outside lighting.  Clear walkways of tools, rocks, debris, and clutter.  Check that handrails are not broken and are securely fastened. Both sides of steps should have handrails.  Have leaves, snow, and ice cleared regularly.  Use sand or salt on walkways during winter months.  In the garage, clean up grease or oil spills. BATHROOM  Install night lights.  Install grab bars by the toilet and in the tub and shower.  Use non-skid mats or decals in the tub or shower.  Place a plastic non-slip stool in the shower to sit on, if needed.  Keep floors dry and clean up all water on the floor immediately.  Remove soap buildup in the tub or shower on a regular basis.  Secure bath mats  with non-slip, double-sided rug tape.  Remove throw rugs and tripping  hazards from the floors. BEDROOMS  Install night lights.  Make sure a bedside light is easy to reach.  Do not use oversized bedding.  Keep a telephone by your bedside.  Have a firm chair with side arms to use for getting dressed.  Remove throw rugs and tripping hazards from the floor. KITCHEN  Keep handles on pots and pans turned toward the center of the stove. Use back burners when possible.  Clean up spills quickly and allow time for drying.  Avoid walking on wet floors.  Avoid hot utensils and knives.  Position shelves so they are not too high or low.  Place commonly used objects within easy reach.  If necessary, use a sturdy step stool with a grab bar when reaching.  Keep electrical cables out of the way.  Do not use floor polish or wax that makes floors slippery. If you must use wax, use non-skid floor wax.  Remove throw rugs and tripping hazards from the floor. STAIRWAYS  Never leave objects on stairs.  Place handrails on both sides of stairways and use them. Fix any loose handrails. Make sure handrails on both sides of the stairways are as long as the stairs.  Check carpeting to make sure it is firmly attached along stairs. Make repairs to worn or loose carpet promptly.  Avoid placing throw rugs at the top or bottom of stairways, or properly secure the rug with carpet tape to prevent slippage. Get rid of throw rugs, if possible.  Have an electrician put in a light switch at the top and bottom of the stairs. OTHER FALL PREVENTION TIPS  Wear low-heel or rubber-soled shoes that are supportive and fit well. Wear closed toe shoes.  When using a stepladder, make sure it is fully opened and both spreaders are firmly locked. Do not climb a closed stepladder.  Add color or contrast paint or tape to grab bars and handrails in your home. Place contrasting color strips on first and last  steps.  Learn and use mobility aids as needed. Install an electrical emergency response system.  Turn on lights to avoid dark areas. Replace light bulbs that burn out immediately. Get light switches that glow.  Arrange furniture to create clear pathways. Keep furniture in the same place.  Firmly attach carpet with non-skid or double-sided tape.  Eliminate uneven floor surfaces.  Select a carpet pattern that does not visually hide the edge of steps.  Be aware of all pets. OTHER HOME SAFETY TIPS  Set the water temperature for 120 F (48.8 C).  Keep emergency numbers on or near the telephone.  Keep smoke detectors on every level of the home and near sleeping areas. Document Released: 11/11/2002 Document Revised: 05/22/2012 Document Reviewed: 02/10/2012 [facility] Patient Information 2014 [place].     [name]. Panosh M.D.  Pre visit review using our clinic review tool, if applicable. No additional management support is needed unless otherwise documented below in the visit note.

## 2014-05-01 ENCOUNTER — Telehealth: Payer: Self-pay | Admitting: Internal Medicine

## 2014-05-01 NOTE — Telephone Encounter (Signed)
Relevant patient education assigned to patient using Emmi. ° °

## 2014-05-23 ENCOUNTER — Other Ambulatory Visit: Payer: Self-pay

## 2014-07-09 ENCOUNTER — Telehealth: Payer: Self-pay | Admitting: Internal Medicine

## 2014-07-09 NOTE — Telephone Encounter (Signed)
Ok to refill x 1  

## 2014-07-09 NOTE — Telephone Encounter (Signed)
Pt needs new rx hydrocodone °

## 2014-07-10 MED ORDER — HYDROCODONE-ACETAMINOPHEN 10-325 MG PO TABS: ORAL_TABLET | ORAL | Status: DC

## 2014-07-10 NOTE — Telephone Encounter (Signed)
Left a message for the pt to pick up his rx at the front desk.

## 2014-07-28 ENCOUNTER — Other Ambulatory Visit: Payer: Self-pay | Admitting: Family Medicine

## 2014-07-28 MED ORDER — HYDROCODONE-ACETAMINOPHEN 10-325 MG PO TABS: ORAL_TABLET | ORAL | Status: DC

## 2014-08-26 ENCOUNTER — Telehealth: Payer: Self-pay | Admitting: Internal Medicine

## 2014-08-26 MED ORDER — HYDROCODONE-ACETAMINOPHEN 10-325 MG PO TABS: ORAL_TABLET | ORAL | Status: DC

## 2014-08-26 NOTE — Telephone Encounter (Signed)
done

## 2014-08-26 NOTE — Telephone Encounter (Signed)
Please call when refill rx is ready for pick up for hydrocodone   af

## 2014-08-26 NOTE — Telephone Encounter (Signed)
pls advise

## 2014-08-27 NOTE — Telephone Encounter (Signed)
Script is ready for pick up and I spoke with pt.  

## 2014-09-09 ENCOUNTER — Other Ambulatory Visit: Payer: Self-pay | Admitting: *Deleted

## 2014-09-12 ENCOUNTER — Other Ambulatory Visit: Payer: Medicare Other

## 2014-09-19 ENCOUNTER — Ambulatory Visit (INDEPENDENT_AMBULATORY_CARE_PROVIDER_SITE_OTHER): Payer: Medicare Other | Admitting: Cardiology

## 2014-09-19 ENCOUNTER — Encounter: Payer: Self-pay | Admitting: Cardiology

## 2014-09-19 VITALS — BP 126/78 | HR 101 | Ht 71.0 in | Wt 189.7 lb

## 2014-09-19 DIAGNOSIS — I2510 Atherosclerotic heart disease of native coronary artery without angina pectoris: Secondary | ICD-10-CM

## 2014-09-19 MED ORDER — ATORVASTATIN CALCIUM 20 MG PO TABS: 20.0000 mg | ORAL_TABLET | Freq: Every day | ORAL | Status: DC

## 2014-09-19 NOTE — Assessment & Plan Note (Signed)
Patient was concerned that statin previously was causing decreased memory. It was discontinued but his memory did not change. We will therefore try Lipitor 20 mg daily. Check lipids and liver in 4 weeks.

## 2014-09-19 NOTE — Assessment & Plan Note (Signed)
Blood pressure controlled. Continue present medications. 

## 2014-09-19 NOTE — Assessment & Plan Note (Signed)
Resume statin. Patient not on aspirin as it has caused significant hematuria previously. This is related to previous prostate surgery.

## 2014-09-19 NOTE — Progress Notes (Signed)
HPI: FU coronary artery disease. Patient had a Taxus drug-eluting stent to proximal and mid LAD in 2004. Last catheterization in 2007 showed a 70% second diagonal which was jailed. There was a 40-50% right coronary artery and LAD stents were patent. Ejection fraction 40-45%. Medical therapy recommended. Last echocardiogram in March of 2013 showed normal LV function. Nuclear study in May of 2013 showed normal perfusion. The study was not gated. There was a hypertensive response. Abdominal CT in March of 2014 showed no aneurysm. Since he was last seen there is no dyspnea, chest pain, palpitations or syncope.   Current Outpatient Prescriptions  Medication Sig Dispense Refill  . HYDROcodone-acetaminophen (NORCO) 10-325 MG per tablet Take half a tablet every 5 hours as needed  60 tablet  0  . metoprolol tartrate (LOPRESSOR) 25 MG tablet TAKE 1 TABLET TWICE A DAY  180 tablet  2  . Multiple Vitamins-Minerals (OCUVITE PRESERVISION) TABS Take 1 tablet by mouth 2 (two) times daily.        Marland Kitchen omeprazole (PRILOSEC OTC) 20 MG tablet Take 20 mg by mouth daily as needed.       Marland Kitchen OVER THE COUNTER MEDICATION Take 1 tablet by mouth 2 (two) times daily. Calcium Citrate 500mg  w/ Vitamin D 800mg       . pseudoephedrine (SUDAFED) 30 MG tablet Take 30 mg by mouth every 4 (four) hours as needed for congestion.       . [DISCONTINUED] Calcium Carbonate-Vitamin D (CALTRATE 600+D) 600-400 MG-UNIT per tablet Take 1 tablet by mouth 2 (two) times daily.         No current facility-administered medications for this visit.     Past Medical History  Diagnosis Date  . CAD (coronary artery disease)     s/p Taxus DES to prox and mid LAD 06/2003;  LHC  1/07: LM 205, LAD stents ok., oD2 70% jailed (tx conservatively), mid to dist RCA 40-50%, EF 40-45%;  echo 2/07: EF 55-65%  . Hyperlipidemia   . COPD (chronic obstructive pulmonary disease)   . GERD (gastroesophageal reflux disease)   . Osteoarthritis   . Anemia   .  Hypothyroidism   . Hyperglycemia   . Hiatal hernia   . Hemorrhoids   . History of renal calculi   . Diverticulosis of colon   . Adenomatous colon polyp   . Dermatitis   . Arthritis   . History of prostate cancer   . Migraines   . Transfusion history     Past Surgical History  Procedure Laterality Date  . Total knee arthroplasty  2001/2002  . Lithotripsy  1996  . Colonoscopy  2005  . Prostatectomy    . Appendectomy    . Retinal detachment surgery    . Broken wrist    . Radiation for prostate    . Broken knee cap    . Right knee replacement      x 2  . Left knee replacement    . Stint      History   Social History  . Marital Status: Married    Spouse Name: N/A    Number of Children: N/A  . Years of Education: N/A   Occupational History  . Retired    Social History Main Topics  . Smoking status: Former Research scientist (life sciences)  . Smokeless tobacco: Never Used     Comment: Stopped in 1986  . Alcohol Use: No  . Drug Use: No  . Sexual Activity: Not on file  Other Topics Concern  . Not on file   Social History Narrative   No regular exercise   HHof 2   Married wife no pets       Remote hx of tobacco stopped 1965   No etoh   REtired Investment banker, corporate level.    ROS: Arthralgias but no fevers or chills, productive cough, hemoptysis, dysphasia, odynophagia, melena, hematochezia, dysuria, hematuria, rash, seizure activity, orthopnea, PND, pedal edema, claudication. Remaining systems are negative.  Physical Exam: Well-developed well-nourished in no acute distress.  Skin is warm and dry.  HEENT is normal.  Neck is supple.  Chest is clear to auscultation with normal expansion.  Cardiovascular exam is regular rate and rhythm.  Abdominal exam nontender or distended. No masses palpated. Extremities show no edema. neuro grossly intact  ECG Sinus tachycardia, no ST changes.

## 2014-09-19 NOTE — Patient Instructions (Signed)
Your physician wants you to follow-up in: [place] will receive a reminder letter in the mail two months in advance. If you don't receive a letter, please call our office to schedule the follow-up appointment.   START ATORVASTATIN 20 MG ONCE DAILY  Your physician recommends that you return for lab work in: 4 WEEKS= DO NOT EAT PRIOR TO LAB WORK

## 2014-10-11 ENCOUNTER — Other Ambulatory Visit: Payer: Self-pay | Admitting: Cardiovascular Disease

## 2014-10-13 ENCOUNTER — Telehealth: Payer: Self-pay | Admitting: Internal Medicine

## 2014-10-13 NOTE — Telephone Encounter (Signed)
Pt needs new rx hydrocodone °

## 2014-10-14 MED ORDER — HYDROCODONE-ACETAMINOPHEN 10-325 MG PO TABS: ORAL_TABLET | ORAL | Status: DC

## 2014-10-14 NOTE — Telephone Encounter (Signed)
Pt notified to pick up at the front desk. 

## 2014-10-14 NOTE — Telephone Encounter (Signed)
Ok to refill x 1   Due for ROV before more refill s

## 2014-11-12 ENCOUNTER — Telehealth: Payer: Self-pay | Admitting: Internal Medicine

## 2014-11-12 NOTE — Telephone Encounter (Signed)
Pt is requesting re-fill on HYDROcodone-acetaminophen (NORCO) 10-325 MG per tablet. Patient is aware Dr. Regis Bill is out of the office and there may be a delay in the re-fill.  He verbalized understanding.

## 2014-11-13 MED ORDER — HYDROCODONE-ACETAMINOPHEN 10-325 MG PO TABS: ORAL_TABLET | ORAL | Status: DC

## 2014-11-13 NOTE — Telephone Encounter (Signed)
done

## 2014-11-13 NOTE — Telephone Encounter (Signed)
Patient notified to pick up at the front desk.

## 2014-12-02 ENCOUNTER — Encounter: Payer: Self-pay | Admitting: Internal Medicine

## 2014-12-02 ENCOUNTER — Ambulatory Visit (INDEPENDENT_AMBULATORY_CARE_PROVIDER_SITE_OTHER): Payer: Medicare Other | Admitting: Internal Medicine

## 2014-12-02 VITALS — BP 146/86 | Temp 98.1°F | Ht 67.0 in | Wt 191.6 lb

## 2014-12-02 DIAGNOSIS — Z5189 Encounter for other specified aftercare: Secondary | ICD-10-CM

## 2014-12-02 DIAGNOSIS — Z79899 Other long term (current) drug therapy: Secondary | ICD-10-CM

## 2014-12-02 DIAGNOSIS — R413 Other amnesia: Secondary | ICD-10-CM

## 2014-12-02 DIAGNOSIS — I10 Essential (primary) hypertension: Secondary | ICD-10-CM

## 2014-12-02 DIAGNOSIS — Z79891 Long term (current) use of opiate analgesic: Secondary | ICD-10-CM

## 2014-12-02 DIAGNOSIS — M810 Age-related osteoporosis without current pathological fracture: Secondary | ICD-10-CM

## 2014-12-02 DIAGNOSIS — R52 Pain, unspecified: Secondary | ICD-10-CM

## 2014-12-02 DIAGNOSIS — Z9181 History of falling: Secondary | ICD-10-CM

## 2014-12-02 DIAGNOSIS — Z2821 Immunization not carried out because of patient refusal: Secondary | ICD-10-CM

## 2014-12-02 NOTE — Patient Instructions (Signed)
Because of the memory problem  You will be contacted about seeing neurology for evaluation. Will also be contacted   about physical therapy program the help prevent falls.  Get a bone density  As we discussed  .  And decide on further medication to prevent bone freacure s.   ROV after evaluation by neurology or 4 months .

## 2014-12-02 NOTE — Progress Notes (Signed)
Pre visit review using our clinic review tool, if applicable. No additional management support is needed unless otherwise documented below in the visit note.  Chief Complaint  Patient presents with  . Follow-up    high risk med CDM    HPI: [name] 80 y.o. here for Chronic disease management  Hx of cad copd gerd elevated lpids hx of prostate cancer  On chronic opiates for years for pain . Memory bp cad  Seen cards  Names .    Forgetting.  More often no disorientation 24 hour recall ok .   Driving  Without getting lost.   Pain med 1/2 every 5 hours maximum.   No change if doesn't  take hard to get out of bed .   ROS: See pertinent positives and negatives per HPI. No current cp sob .  No change in vision hearing Using aids denies tremors neg family hx  Getting some rx for artteries ?   Past Medical History  Diagnosis Date  . CAD (coronary artery disease)     s/p Taxus DES to prox and mid LAD 06/2003;  LHC  1/07: LM 205, LAD stents ok., oD2 70% jailed (tx conservatively), mid to dist RCA 40-50%, EF 40-45%;  echo 2/07: EF 55-65%  . Hyperlipidemia   . COPD (chronic obstructive pulmonary disease)   . GERD (gastroesophageal reflux disease)   . Osteoarthritis   . Anemia   . Hypothyroidism   . Hyperglycemia   . Hiatal hernia   . Hemorrhoids   . History of renal calculi   . Diverticulosis of colon   . Adenomatous colon polyp   . Dermatitis   . Arthritis   . History of prostate cancer   . Migraines   . Transfusion history     Family History  Problem Relation Age of Onset  . Hypertension Other   . Arthritis Other   . Cancer Other   . Rheum arthritis Daughter     History   Social History  . Marital Status: Married    Spouse Name: [name]    Number of Children: [name]  . Years of Education: [name]   Occupational History  . Retired    Social History Main Topics  . Smoking status: Former Research scientist (life sciences)  . Smokeless tobacco: Never Used     Comment: Stopped in 1986  . Alcohol Use: No    . Drug Use: No  . Sexual Activity: None   Other Topics Concern  . None   Social History Narrative   No regular exercise   HHof 2   Married wife no pets       Remote hx of tobacco stopped 1965   No etoh   REtired Investment banker, corporate level.    Outpatient Encounter Prescriptions as of 12/02/2014  Medication Sig  . atorvastatin (LIPITOR) 20 MG tablet Take 1 tablet (20 mg total) by mouth daily.  Marland Kitchen HYDROcodone-acetaminophen (NORCO) 10-325 MG per tablet Take half a tablet every 5 hours as needed  . metoprolol tartrate (LOPRESSOR) 25 MG tablet TAKE 1 TABLET TWICE A DAY  . Multiple Vitamins-Minerals (OCUVITE PRESERVISION) TABS Take 1 tablet by mouth 2 (two) times daily.    Marland Kitchen omeprazole (PRILOSEC OTC) 20 MG tablet Take 20 mg by mouth daily as needed.   Marland Kitchen OVER THE COUNTER MEDICATION Take 1 tablet by mouth 2 (two) times daily. Calcium Citrate 500mg  w/ Vitamin D 800mg   . pseudoephedrine (SUDAFED) 30 MG tablet Take 30 mg by mouth every  4 (four) hours as needed for congestion.     EXAM:  BP 146/86 mmHg  Temp(Src) 98.1 F (36.7 C) (Oral)  Ht 5\' 7"  (1.702 m)  Wt 191 lb 9.6 oz (86.909 kg)  BMI 30.00 kg/m2  Body mass index is 30 kg/(m^2).  GENERAL: vitals reviewed and listed above, alert, oriented, appears well hydrated and in no acute distress HEENT: atraumatic, conjunctiva  clear, no obvious abnormalities on inspection of external nose and ears hearing aids  NECK: no obvious masses on inspection palpation  LUNGS: clear to auscultation bilaterally, no wheezes, rales or rhonchi, CV: HRRR, no clubbing cyanosis or  peripheral edema nl cap refill  MS: moves all extremities without noticeable focal  abnormality PSYCH: pleasant and cooperative, no obvious depression or anxiety   Up from chair and walking  Ok  No cogwheeling  Neg rhonmberg slight tremor intention orented to x 3  But couldn't name president but could describe him   BP Readings from Last 3 Encounters:  12/02/14 146/86  09/19/14  126/78  04/30/14 160/70   Lab Results  Component Value Date   WBC 5.6 04/21/2014   HGB 14.3 04/21/2014   HCT 43.5 04/21/2014   PLT 184.0 04/21/2014   GLUCOSE 90 04/21/2014   CHOL 149 04/21/2014   TRIG 87.0 04/21/2014   HDL 47.60 04/21/2014   LDLCALC 84 04/21/2014   ALT 16 04/21/2014   AST 20 04/21/2014   NA 141 04/21/2014   K 4.2 04/21/2014   CL 105 04/21/2014   CREATININE 1.0 04/21/2014   BUN 23 04/21/2014   CO2 28 04/21/2014   TSH 1.58 04/21/2014   INR 1.09 02/25/2012     ASSESSMENT AND PLAN:  Discussed the following assessment and plan:  Memory problem - cognitive changes  eval for reversable causes intervnetion sligh ttremor but no parkinsone demenor - Plan: Ambulatory referral to Neurology  Essential hypertension  Medication management  Hx of fall - Plan: Ambulatory referral to Neurology, Ambulatory referral to Physical Therapy  Pain management  Chronically on opiate therapy  Influenza vaccination declined  Osteoporosis - Plan: DG Bone Density See previous notes    chonric opiate use  Says  bvenefit more than risk  Disc risk of falling  Cns changes etc   Has hx of fall 11 14 with rpelvic fracuter fell  Recently  Fell on tailbone . And better .  bone density  Hx of fracture and  Poss intervention with bisphosphonates  vivions change . Reports hx of vit d rx in past   No other meds>  -Patient advised to return or notify health care team  if symptoms worsen ,persist or new concerns arise.  Patient Instructions  Because of the memory problem  You will be contacted about seeing neurology for evaluation. Will also be contacted   about physical therapy program the help prevent falls.  Get a bone density  As we discussed  .  And decide on further medication to prevent bone freacure s.   ROV after evaluation by neurology or 4 months .         [name]. Macky Galik M.D.

## 2014-12-03 ENCOUNTER — Ambulatory Visit (INDEPENDENT_AMBULATORY_CARE_PROVIDER_SITE_OTHER)
Admission: RE | Admit: 2014-12-03 | Discharge: 2014-12-03 | Disposition: A | Discharge status: Home / Self Care | Payer: Medicare Other | Source: Ambulatory Visit | Attending: Internal Medicine | Admitting: Internal Medicine

## 2014-12-03 DIAGNOSIS — M810 Age-related osteoporosis without current pathological fracture: Secondary | ICD-10-CM

## 2014-12-10 ENCOUNTER — Ambulatory Visit: Payer: Medicare Other | Attending: Internal Medicine

## 2014-12-10 DIAGNOSIS — Z9181 History of falling: Secondary | ICD-10-CM | POA: Insufficient documentation

## 2014-12-10 NOTE — Therapy (Signed)
[facility] [address] [place], [place], [address] Phone: [phone]   Fax:  [phone]  Physical Therapy Evaluation  Patient Details  Name: [name] MRN: [id number] Date of Birth: Dec 11, 1929  Encounter Date: 12/10/2014      PT End of Session - 12/10/14 1239    Visit Number 1   Number of Visits 1   Authorization Type [employer] state medicare   PT Start Time 0930   PT Stop Time 1015   PT Time Calculation (min) 45 min      Past Medical History  Diagnosis Date  . CAD (coronary artery disease)     s/p Taxus DES to prox and mid LAD 06/2003;  LHC  1/07: LM 205, LAD stents ok., oD2 70% jailed (tx conservatively), mid to dist RCA 40-50%, EF 40-45%;  echo 2/07: EF 55-65%  . Hyperlipidemia   . COPD (chronic obstructive pulmonary disease)   . GERD (gastroesophageal reflux disease)   . Osteoarthritis   . Anemia   . Hypothyroidism   . Hyperglycemia   . Hiatal hernia   . Hemorrhoids   . History of renal calculi   . Diverticulosis of colon   . Adenomatous colon polyp   . Dermatitis   . Arthritis   . History of prostate cancer   . Migraines   . Transfusion history     Past Surgical History  Procedure Laterality Date  . Total knee arthroplasty  2001/2002  . Lithotripsy  1996  . Colonoscopy  2005  . Prostatectomy    . Appendectomy    . Retinal detachment surgery    . Broken wrist    . Radiation for prostate    . Broken knee cap    . Right knee replacement      x 2  . Left knee replacement    . Stint      There were no vitals taken for this visit.  Visit Diagnosis:  History of fall      Subjective Assessment - 12/10/14 0939    Symptoms Pt believes he is here for memory problems, and feels that his memory problems are due to medication changes. He feels that it may be due to hydrocodone or due to his cholesterol medication or "any of my medications." Pt has also had a fall which was the reason for his  physician referral to PT. He was washing jugs outside and tripped just after he rose from a stooped position. This occurred on "that day the towers fell, last year" 08/15/2014.  "That is the only fall I know about, I may have had others but they didn't do damage or anything."   Pertinent History History of knee replacements on both knees   Patient Stated Goals to improve memory (pt informed that PT does not work on memory but ST may be recommended) and balance          [facility] PT Assessment - 12/10/14 0001    Assessment   Medical Diagnosis History of Fall   Onset Date 08/15/14   Prior Therapy reports he went to therapy after falling last year   Precautions   Precautions Fall   Balance Screen   Has the patient fallen in the past 6 months Yes   How many times? 1   Has the patient had a decrease in activity level because of a fear of falling?  No   Is the patient reluctant to leave their home because of a  fear of falling?  No   Home Environment   Living Enviornment Private residence   Living Arrangements Spouse/significant other   Available Help at Discharge Family   Type of [place] to enter   Entrance Stairs-Number of Steps 4   Entrance Stairs-Rails None   Home Layout Two level   Alternate Level Stairs-Number of Steps 14   Alternate Level Stairs-Rails Left   Home Equipment None;Shower seat;Grab bars - tub/shower   Prior Function   Level of Independence Independent with basic ADLs;Independent with homemaking with ambulation;Independent with gait;Independent with transfers   [place] --  enjoys working in the yard   Cognition   Overall Cognitive Status Impaired/Different from baseline   Posture/Postural Control   Posture/Postural Control No significant limitations   Transfers   Transfers Sit to Stand   Sit to Stand 7: Independent   Ambulation/Gait   Ambulation/Gait Yes   Ambulation/Gait Assistance 7: Independent   Ambulation Distance (Feet) 250 Feet    Assistive device None   Gait Pattern Within Functional Limits   Gait velocity 3.82 ft/sec  within normal community ambulator limits   Stairs Yes   Stairs Assistance 7: Independent   Stair Management Technique No rails   Number of Stairs 4   Standardized Balance Assessment   Standardized Balance Assessment Berg Balance Test;Timed Up and Go Test;Dynamic Gait Index   Berg Balance Test   Sit to Stand Able to stand without using hands and stabilize independently   Standing Unsupported Able to stand safely 2 minutes   Sitting with Back Unsupported but Feet Supported on Floor or Stool Able to sit safely and securely 2 minutes   Stand to Sit Sits safely with minimal use of hands   Transfers Able to transfer safely, minor use of hands   Standing Unsupported with Eyes Closed Able to stand 10 seconds safely   Standing Ubsupported with Feet Together Able to place feet together independently and stand 1 minute safely   From Standing, Reach Forward with Outstretched Arm Can reach confidently >25 cm (10")   From Standing Position, Pick up Object from Floor Able to pick up shoe safely and easily   From Standing Position, Turn to Look Behind Over each Shoulder Looks behind one side only/other side shows less weight shift   Turn 360 Degrees Able to turn 360 degrees safely in 4 seconds or less   Standing Unsupported, Alternately Place Feet on Step/Stool Able to stand independently and safely and complete 8 steps in 20 seconds   Standing Unsupported, One Foot in Front Able to place foot tandem independently and hold 30 seconds   Standing on One Leg Tries to lift leg/unable to hold 3 seconds but remains standing independently   Total Score 52   Dynamic Gait Index   Level Surface Normal   Change in Gait Speed Normal   Gait with Horizontal Head Turns Mild Impairment   Gait with Vertical Head Turns Normal   Gait and Pivot Turn Normal   Step Over Obstacle Normal   Step Around Obstacles Normal   Steps Normal    Total Score 23   Timed Up and Go Test   TUG Normal TUG   Normal TUG (seconds) 9                                      Plan - 12/10/14 1240  Clinical Impression Statement Upon assessment, pt scored within normal limits on all balance tests, also is safe with stair negotiation and ambulation. Pt only recalls one fall which was due to tripping over a jug he was cleaning on the driveway. Unable to replicate any instance of instabilitywith functional mobility. PT is not necesary at this time.    Recommended Other Services I recommend speech therapy services to address memory impairment and other possible cognitive impairment.   Consulted and Agree with Plan of Care Patient          G-Codes - 12/18/2014 1251    Functional Assessment Tool Used DGI 23/24   Functional Limitation Mobility: Walking and moving around   Mobility: Walking and Moving Around Current Status [phone]) At least 1 percent but less than 20 percent impaired, limited or restricted   Mobility: Walking and Moving Around Goal Status [phone]) At least 1 percent but less than 20 percent impaired, limited or restricted   Mobility: Walking and Moving Around Discharge Status [phone]) At least 1 percent but less than 20 percent impaired, limited or restricted       Problem List Patient Active Problem List   Diagnosis Date Noted  . Chronically on opiate therapy 12/02/2014  . Visit for preventive health examination 04/30/2014  . Pain management 04/30/2014  . Essential hypertension 09/19/2013  . Medication management 08/19/2012  . Wears hearing aid 02/19/2012  . Macular degeneration 02/19/2012  . Opiate use 02/19/2012  . LESION, FACE 03/17/2010  . SYNCOPE 03/17/2010  . HYPERKERATOSIS 10/06/2009  . OSTEOARTHRITIS 05/22/2009  . HYPOTHYROIDISM 07/02/2008  . ANEMIA 07/02/2008  . FATIGUE 07/02/2008  . TOTAL KNEE REPLACEMENT, RIGHT, HX OF 07/02/2008  . ADENOMATOUS COLONIC POLYP 01/31/2008  .  HEMORRHOIDS 01/31/2008  . COPD 01/31/2008  . HIATAL HERNIA 01/31/2008  . DIVERTICULOSIS OF COLON 01/31/2008  . RENAL CALCULUS, HX OF 01/31/2008  . DERMATITIS 11/06/2007  . ARTHRITIS 11/06/2007  . HYPERLIPIDEMIA 06/20/2007  . Coronary atherosclerosis 06/20/2007  . GERD 06/20/2007  . PROSTATE CANCER, HX OF 06/20/2007    Delrae Sawyers D 12/18/14, 12:56 PM  [facility] [address] [place], [place], [address] Phone: [phone]   Fax:  [phone]

## 2014-12-11 ENCOUNTER — Other Ambulatory Visit: Payer: Self-pay | Admitting: Family Medicine

## 2014-12-11 DIAGNOSIS — M810 Age-related osteoporosis without current pathological fracture: Secondary | ICD-10-CM

## 2014-12-11 MED ORDER — ALENDRONATE SODIUM 70 MG PO TABS: 70.0000 mg | ORAL_TABLET | ORAL | Status: DC

## 2014-12-11 NOTE — Telephone Encounter (Signed)
Sent to the pharmacy by e-scribe. 

## 2014-12-15 ENCOUNTER — Encounter: Payer: Self-pay | Admitting: Internal Medicine

## 2014-12-15 ENCOUNTER — Ambulatory Visit (INDEPENDENT_AMBULATORY_CARE_PROVIDER_SITE_OTHER): Payer: Medicare Other | Admitting: Internal Medicine

## 2014-12-15 VITALS — BP 126/84 | Temp 97.5°F | Wt 193.1 lb

## 2014-12-15 DIAGNOSIS — R319 Hematuria, unspecified: Secondary | ICD-10-CM

## 2014-12-15 DIAGNOSIS — M810 Age-related osteoporosis without current pathological fracture: Secondary | ICD-10-CM

## 2014-12-15 LAB — BASIC METABOLIC PANEL
BUN: 20 mg/dL (ref 6–23)
CO2: 30 meq/L (ref 19–32)
Calcium: 9.5 mg/dL (ref 8.4–10.5)
Chloride: 106 mEq/L (ref 96–112)
Creatinine, Ser: 0.9 mg/dL (ref 0.4–1.5)
GFR: 81.54 mL/min (ref >60.00)
GLUCOSE: 94 mg/dL (ref 70–99)
Potassium: 4.1 mEq/L (ref 3.5–5.1)
Sodium: 141 mEq/L (ref 135–145)

## 2014-12-15 LAB — VITAMIN D 25 HYDROXY (VIT D DEFICIENCY, FRACTURES): VITD: 28.37 ng/mL — ABNORMAL LOW (ref 30.00–100.00)

## 2014-12-15 LAB — POCT URINALYSIS DIPSTICK
BILIRUBIN UA: NEGATIVE
GLUCOSE UA: NEGATIVE
Ketones, UA: NEGATIVE
LEUKOCYTES UA: NEGATIVE
Nitrite, UA: NEGATIVE
PH UA: 5.5
Protein, UA: NEGATIVE
Spec Grav, UA: 1.01
Urobilinogen, UA: 0.2

## 2014-12-15 NOTE — Patient Instructions (Addendum)
I  dont know why you had the blood in the urine .    No infection today. By urine . I don t  see that the  fosamax  Is  a cause of the  Blood in the  Urine. That medicine is not aspirin and [place] for preventing bone fractures.     Like the one you  Had in 2014 . We need to arrange for  You to see dr Jeffie Pollock for this .

## 2014-12-15 NOTE — Progress Notes (Signed)
Chief Complaint  Patient presents with  . Hematuria    HPI: [name] 81 y.o. comes in fro acute work in with wife  because his wife  reports there is blood in his urine .   Awake the day after beginning fosamax   And noted  br blood urine like when was on asa in the past   Lasted  1.5 days  ... Non now  He undergoing eva for     Memory . Neuro sx soon , also fall evauation with hx of pelvic fracture in 2014 after fall .  dexa showed osteoporosis  Hip -2.7  Hx of prostate cancer  Dr Jeffie Pollock last seen  In nov  And was ok   Remote hx of blood in urine .  ? Related to radiation? ROS: See pertinent positives and negatives per HPI. No fever back pain dysuria  Has hx of vit d defic  Past Medical History  Diagnosis Date  . CAD (coronary artery disease)     s/p Taxus DES to prox and mid LAD 06/2003;  LHC  1/07: LM 205, LAD stents ok., oD2 70% jailed (tx conservatively), mid to dist RCA 40-50%, EF 40-45%;  echo 2/07: EF 55-65%  . Hyperlipidemia   . COPD (chronic obstructive pulmonary disease)   . GERD (gastroesophageal reflux disease)   . Osteoarthritis   . Anemia   . Hypothyroidism   . Hyperglycemia   . Hiatal hernia   . Hemorrhoids   . History of renal calculi   . Diverticulosis of colon   . Adenomatous colon polyp   . Dermatitis   . Arthritis   . History of prostate cancer   . Migraines   . Transfusion history     Family History  Problem Relation Age of Onset  . Hypertension Other   . Arthritis Other   . Cancer Other   . Rheum arthritis Daughter     History   Social History  . Marital Status: Married    Spouse Name: N/A    Number of Children: N/A  . Years of Education: N/A   Occupational History  . Retired    Social History Main Topics  . Smoking status: Former Research scientist (life sciences)  . Smokeless tobacco: Never Used     Comment: Stopped in 1986  . Alcohol Use: No  . Drug Use: No  . Sexual Activity: Not on file   Other Topics Concern  . Not on file   Social History  Narrative   No regular exercise   HHof 2   Married wife no pets       Remote hx of tobacco stopped 1965   No etoh   REtired Investment banker, corporate level.    Outpatient Encounter Prescriptions as of 12/15/2014  Medication Sig  . atorvastatin (LIPITOR) 20 MG tablet Take 1 tablet (20 mg total) by mouth daily.  Marland Kitchen HYDROcodone-acetaminophen (NORCO) 10-325 MG per tablet Take half a tablet every 5 hours as needed  . metoprolol tartrate (LOPRESSOR) 25 MG tablet TAKE 1 TABLET TWICE A DAY  . Multiple Vitamins-Minerals (OCUVITE PRESERVISION) TABS Take 1 tablet by mouth 2 (two) times daily.    Marland Kitchen omeprazole (PRILOSEC OTC) 20 MG tablet Take 20 mg by mouth daily as needed.   Marland Kitchen OVER THE COUNTER MEDICATION Take 1 tablet by mouth 2 (two) times daily. Calcium Citrate 500mg  w/ Vitamin D 800mg   . pravastatin (PRAVACHOL) 20 MG tablet Take 20 mg by mouth daily.  . pseudoephedrine (  SUDAFED) 30 MG tablet Take 30 mg by mouth every 4 (four) hours as needed for congestion.   Marland Kitchen alendronate (FOSAMAX) 70 MG tablet Take 1 tablet (70 mg total) by mouth every 7 (seven) days. Take with a full glass of water on an empty stomach. (Patient not taking: Reported on 12/15/2014)    EXAM:  BP 126/84 mmHg  Temp(Src) 97.5 F (36.4 C) (Oral)  Wt 193 lb 1.6 oz (87.59 kg)  Body mass index is 30.24 kg/(m^2). Here with wife  GENERAL: vitals reviewed and listed above, alert, oriented, appears well hydrated and in no acute distress HEENT: atraumatic, conjunctiva  clear, no obvious abnormalities on inspection of external nose and ears  No cva pain MS: moves all extremities without noticeable focal  abnormality PSYCH: pleasant and cooperative, no obvious depression or anxiety UA :clear except trc blood  ASSESSMENT AND PLAN:  Discussed the following assessment and plan:  Hematuria - Plan: Basic metabolic panel, Ambulatory referral to Urology  Blood in urine - Plan: POCT Urine Dip, Basic metabolic panel, Ambulatory referral to  Urology  Osteoporosis - by dexa and  hx of pelvic fx after fall 204  dont think med causeing hematuria - Plan: Vit D  25 hydroxy (rtn osteoporosis monitoring), PTH, Intact and Calcium  -Patient advised to return or notify health care team  if symptoms worsen ,persist or new concerns arise.  Patient Instructions  I  dont know why you had the blood in the urine .    No infection today. By urine . I don t  see that the  fosamax  Is  a cause of the  Blood in the  Urine. That medicine is not aspirin and [place] for preventing bone fractures.     Like the one you  Had in 2014 . We need to arrange for  You to see dr Jeffie Pollock for this .   [name]. Panosh M.D.  Pre visit review using our clinic review tool, if applicable. No additional management support is needed unless otherwise documented below in the visit note. Total visit 79mins > 50% spent counseling and coordinating care

## 2014-12-16 ENCOUNTER — Telehealth: Payer: Self-pay | Admitting: Cardiology

## 2014-12-16 ENCOUNTER — Other Ambulatory Visit: Payer: Medicare Other

## 2014-12-16 LAB — PTH, INTACT AND CALCIUM
Calcium: 9.3 mg/dL (ref 8.4–10.5)
PTH: 34 pg/mL (ref 14–64)

## 2014-12-16 NOTE — Telephone Encounter (Signed)
Pt called in stating that he needed some lab orders put in . Notify pt when those orders have been placed  Thanks

## 2014-12-16 NOTE — Telephone Encounter (Signed)
Pt had orders put in Oct 15 for rechecking lipids & liver. Advised that these are ready and he can fast & present any day to [facility] to have drawn.

## 2014-12-18 ENCOUNTER — Telehealth: Payer: Self-pay | Admitting: Internal Medicine

## 2014-12-18 LAB — LIPID PANEL
CHOL/HDL RATIO: 2.2 ratio
CHOLESTEROL: 110 mg/dL (ref 0–200)
HDL: 50 mg/dL (ref >39)
LDL Cholesterol: 44 mg/dL (ref 0–99)
Triglycerides: 78 mg/dL (ref <150)
VLDL: 16 mg/dL (ref 0–40)

## 2014-12-18 LAB — HEPATIC FUNCTION PANEL
ALBUMIN: 3.8 g/dL (ref 3.5–5.2)
ALK PHOS: 70 U/L (ref 39–117)
ALT: 11 U/L (ref 0–53)
AST: 16 U/L (ref 0–37)
BILIRUBIN DIRECT: 0.2 mg/dL (ref 0.0–0.3)
BILIRUBIN TOTAL: 0.8 mg/dL (ref 0.2–1.2)
Indirect Bilirubin: 0.6 mg/dL (ref 0.2–1.2)
Total Protein: 6.2 g/dL (ref 6.0–8.3)

## 2014-12-18 MED ORDER — HYDROCODONE-ACETAMINOPHEN 10-325 MG PO TABS: ORAL_TABLET | ORAL | Status: DC

## 2014-12-18 NOTE — Telephone Encounter (Signed)
Printed.  Pt aware ready to pick up

## 2014-12-18 NOTE — Telephone Encounter (Signed)
Ok to refill x 1  

## 2014-12-18 NOTE — Telephone Encounter (Signed)
Pt request refill HYDROcodone-acetaminophen (NORCO) 10-325 MG per tablet

## 2014-12-18 NOTE — Telephone Encounter (Signed)
Last filled 11/13/14.   Ok to fill?

## 2014-12-19 ENCOUNTER — Encounter: Payer: Self-pay | Admitting: *Deleted

## 2015-01-05 ENCOUNTER — Telehealth: Payer: Self-pay | Admitting: Neurology

## 2015-01-05 NOTE — Telephone Encounter (Signed)
Pt hit the cancel button on the phone tree and did not want to cancel the appt so i resch appt for 01-07-15

## 2015-01-07 ENCOUNTER — Ambulatory Visit (INDEPENDENT_AMBULATORY_CARE_PROVIDER_SITE_OTHER): Payer: Medicare Other | Admitting: Neurology

## 2015-01-07 ENCOUNTER — Encounter: Payer: Self-pay | Admitting: Neurology

## 2015-01-07 ENCOUNTER — Ambulatory Visit: Payer: Medicare Other | Admitting: Neurology

## 2015-01-07 VITALS — BP 120/82 | HR 63 | Resp 16 | Ht 71.0 in | Wt 194.0 lb

## 2015-01-07 DIAGNOSIS — Z8546 Personal history of malignant neoplasm of prostate: Secondary | ICD-10-CM

## 2015-01-07 DIAGNOSIS — R413 Other amnesia: Secondary | ICD-10-CM

## 2015-01-07 MED ORDER — DONEPEZIL HCL 5 MG PO TABS: ORAL_TABLET | ORAL | Status: DC

## 2015-01-07 NOTE — Progress Notes (Signed)
NEUROLOGY CONSULTATION NOTE  [name] MRN: [id number] DOB: August 08, 1930  Referring provider: Dr. Shanon Ace Primary care provider: Dr. Shanon Ace  Reason for consult:  Memory loss  Dear Dr Regis Bill:  Thank you for your kind referral of [name] for consultation of the above symptoms. Although his history is well known to you, please allow me to reiterate it for the purpose of our medical record. The patient was accompanied to the clinic by wife who also provides collateral information. Records and images were personally reviewed where available.  HISTORY OF PRESENT ILLNESS: This is an 81 year old right-handed man with a history of hyperlipidemia, hypertension, prostate cancer s/p radiation, presenting for evaluation of memory loss. He feels symptoms started around 2 years ago. His wife reports she noticed changes over the past year. He mostly has difficulty finding words to express himself and pulling up words. Because of this, he does not talk as much as he used to. Symptoms worsen when he gets frustrated. He feels he is taking so much medications that could be affecting his thinking. He forgets names and conversations, occasionally misplaces things. His wife denies that he repeats himself. He denies getting lost driving. He denies missing medications or bill payments.   He denies any headaches, dizziness, diplopia, dysarthria, dysphagia, neck/back pain, focal numbness/tingling/weakness. He reports poor sense of smell for the past year. No tremors. He has been dealing with hematuria. He takes Norco every 5-6 hours for arthritis. He denies any family history of dementia, however his brother had a brain tumor with memory changes, another brother has memory changes attributed to [employer].   Laboratory Data: Lab Results  Component Value Date   WBC 5.6 04/21/2014   HGB 14.3 04/21/2014   HCT 43.5 04/21/2014   MCV 89.4 04/21/2014   PLT 184.0 04/21/2014     Chemistry        Component Value Date/Time   NA 141 12/15/2014 1436   K 4.1 12/15/2014 1436   CL 106 12/15/2014 1436   CO2 30 12/15/2014 1436   BUN 20 12/15/2014 1436   CREATININE 0.9 12/15/2014 1436      Component Value Date/Time   CALCIUM 9.3 12/15/2014 1437   ALKPHOS 70 12/17/2014 0846   AST 16 12/17/2014 0846   ALT 11 12/17/2014 0846   BILITOT 0.8 12/17/2014 0846     Lab Results  Component Value Date   TSH 1.58 04/21/2014   Lab Results  Component Value Date   CHOL 110 12/17/2014   HDL 50 12/17/2014   LDLCALC 44 12/17/2014   TRIG 78 12/17/2014   CHOLHDL 2.2 12/17/2014   PAST MEDICAL HISTORY: Past Medical History  Diagnosis Date  . CAD (coronary artery disease)     s/p Taxus DES to prox and mid LAD 06/2003;  LHC  1/07: LM 205, LAD stents ok., oD2 70% jailed (tx conservatively), mid to dist RCA 40-50%, EF 40-45%;  echo 2/07: EF 55-65%  . Hyperlipidemia   . COPD (chronic obstructive pulmonary disease)   . GERD (gastroesophageal reflux disease)   . Osteoarthritis   . Anemia   . Hypothyroidism   . Hyperglycemia   . Hiatal hernia   . Hemorrhoids   . History of renal calculi   . Diverticulosis of colon   . Adenomatous colon polyp   . Dermatitis   . Arthritis   . History of prostate cancer   . Migraines   . Transfusion history   . Prostate cancer  PAST SURGICAL HISTORY: Past Surgical History  Procedure Laterality Date  . Total knee arthroplasty  2001/2002  . Lithotripsy  1996  . Colonoscopy  2005  . Prostatectomy    . Appendectomy    . Retinal detachment surgery    . Broken wrist    . Radiation for prostate    . Broken knee cap    . Right knee replacement      x 2  . Left knee replacement    . Coronary stent placement      2004    MEDICATIONS: Current Outpatient Prescriptions on File Prior to Visit  Medication Sig Dispense Refill  . atorvastatin (LIPITOR) 20 MG tablet Take 1 tablet (20 mg total) by mouth daily. 90 tablet 3  . HYDROcodone-acetaminophen (NORCO)  10-325 MG per tablet Take half a tablet every 5 hours as needed 60 tablet 0  . metoprolol tartrate (LOPRESSOR) 25 MG tablet TAKE 1 TABLET TWICE A DAY 180 tablet 3  . Multiple Vitamins-Minerals (OCUVITE PRESERVISION) TABS Take 1 tablet by mouth 2 (two) times daily.      Marland Kitchen omeprazole (PRILOSEC OTC) 20 MG tablet Take 20 mg by mouth daily as needed.     Marland Kitchen OVER THE COUNTER MEDICATION Take 1 tablet by mouth 2 (two) times daily. Calcium Citrate 500mg  w/ Vitamin D 800mg     . pseudoephedrine (SUDAFED) 30 MG tablet Take 30 mg by mouth every 4 (four) hours as needed for congestion.     . [DISCONTINUED] Calcium Carbonate-Vitamin D (CALTRATE 600+D) 600-400 MG-UNIT per tablet Take 1 tablet by mouth 2 (two) times daily.       No current facility-administered medications on file prior to visit.    ALLERGIES: Allergies  Allergen Reactions  . Pravastatin     ? Memory deficits improved when went off med  . Aspirin     Daily use causes bleeding in the bladder.  Can take occasionally  . Meperidine Hcl     REACTION: combative    FAMILY HISTORY: Family History  Problem Relation Age of Onset  . Hypertension Other   . Arthritis Other   . Cancer Other   . Rheum arthritis Daughter     SOCIAL HISTORY: History   Social History  . Marital Status: Married    Spouse Name: N/A    Number of Children: N/A  . Years of Education: N/A   Occupational History  . Retired    Social History Main Topics  . Smoking status: Former Research scientist (life sciences)  . Smokeless tobacco: Never Used     Comment: Stopped in 1986  . Alcohol Use: No  . Drug Use: No  . Sexual Activity: Not on file   Other Topics Concern  . Not on file   Social History Narrative   No regular exercise   HHof 2   Married wife no pets       Remote hx of tobacco stopped 1965   No etoh   REtired Investment banker, corporate level.    REVIEW OF SYSTEMS: Constitutional: No fevers, chills, or sweats, no generalized fatigue, change in appetite Eyes: No visual changes,  double vision, eye pain Ear, nose and throat: No hearing loss, ear pain, nasal congestion, sore throat Cardiovascular: No chest pain, palpitations Respiratory:  No shortness of breath at rest or with exertion, wheezes GastrointestinaI: No nausea, vomiting, diarrhea, abdominal pain, fecal incontinence Genitourinary:  No dysuria, urinary retention or frequency Musculoskeletal:  No neck pain, back pain Integumentary: No rash, pruritus, skin lesions  Neurological: as above Psychiatric: No depression, insomnia, anxiety Endocrine: No palpitations, fatigue, diaphoresis, mood swings, change in appetite, change in weight, increased thirst Hematologic/Lymphatic:  No anemia, purpura, petechiae. Allergic/Immunologic: no itchy/runny eyes, nasal congestion, recent allergic reactions, rashes  PHYSICAL EXAM: Filed Vitals:   01/07/15 1332  BP: 120/82  Pulse: 63  Resp: 16   General: No acute distress Head:  Normocephalic/atraumatic Eyes: Fundoscopic exam shows bilateral sharp discs, no vessel changes, exudates, or hemorrhages Neck: supple, no paraspinal tenderness, full range of motion Back: No paraspinal tenderness Heart: regular rate and rhythm Lungs: Clear to auscultation bilaterally. Vascular: No carotid bruits. Skin/Extremities: No rash, no edema Neurological Exam: Mental status: alert and oriented to person, place, and time, no dysarthria or aphasia, Fund of knowledge is appropriate.  Remote memory intact.  Attention and concentration are normal.    Able to name objects and repeat phrases. MMSE - Mini Mental State Exam 01/07/2015  Orientation to time 5  Orientation to Place 5  Registration 3  Attention/ Calculation 4  Recall 0  Language- name 2 objects 2  Language- repeat 1  Language- follow 3 step command 3  Language- read & follow direction 1  Write a sentence 1  Copy design 1  Total score 26   Cranial nerves: CN I: not tested CN II: pupils equal, round and reactive to light,  visual fields intact, fundi unremarkable. CN III, IV, VI:  full range of motion, no nystagmus, no ptosis CN V: facial sensation intact CN VII: upper and lower face symmetric CN VIII: hearing intact to finger rub CN IX, X: gag intact, uvula midline CN XI: sternocleidomastoid and trapezius muscles intact CN XII: tongue midline Bulk & Tone: normal, no cogwheeling, no fasciculations. Motor: 5/5 throughout with no pronator drift. Sensation: decreased pin, cold, vibration in stocking distribution to ankles bilaterally. Intact to light touch, cold, pin on both UE. No extinction to double simultaneous stimulation.  Romberg test negative Deep Tendon Reflexes: +2 on both UE and left patella, +1 right patella, absent ankle jerks bilaterally, no ankle clonus Plantar responses: downgoing bilaterally Cerebellar: no incoordination on finger to nose, heel to shin. No dysdiadochokinesia Gait: narrow-based and steady, able to tandem walk adequately Tremor: none  IMPRESSION: This is an 81 year old right-handed man with hypertension, hyperlipidemia, prostate cancer s/p radiation, presenting for evaluation of memory loss. MMSE today is 26/30, indicating mild cognitive impairment. 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 We discussed different causes of memory loss. TSH normal, check B12 level. With history of prostate cancer, MRI brain with and without contrast will be ordered to assess for underlying structural abnormality, as well as assess vascular load. We discussed pseudodementia and effects of mood on memory. His wife endorsed that symptoms did start after he became worried about his daughter who had medical problems in the past year. They will continue to monitor mood. He may benefit from starting low dose Aricept, side effects and expectations from the medication were discussed. We discussed the importance of control of vascular risk factors, physical exercise and brain stimulation  exercises for brain health. He will follow-up in 3 months.   Thank you for allowing me to participate in the care of this patient. Please do not hesitate to call for any questions or concerns.   Ellouise Newer, M.D.  CC: Dr. Regis Bill

## 2015-01-07 NOTE — Patient Instructions (Signed)
1. Schedule MRI brain with and without contrast 2. Bloodwork for B12 level 3. Start Aricept 5mg  daily 4. Physical exercise and brain stimulation exercises are important for brain health 5. Continue control of BP and cholesterol, discuss statin concern with your cardiologist 6. Follow-up in 3 months

## 2015-01-08 LAB — VITAMIN B12: VITAMIN B 12: 325 pg/mL (ref 211–911)

## 2015-01-16 ENCOUNTER — Ambulatory Visit
Admission: RE | Admit: 2015-01-16 | Discharge: 2015-01-16 | Disposition: A | Discharge status: Home / Self Care | Payer: Medicare Other | Source: Ambulatory Visit | Attending: Neurology | Admitting: Neurology

## 2015-01-16 MED ORDER — GADOBENATE DIMEGLUMINE 529 MG/ML IV SOLN
18.0000 mL | Freq: Once | INTRAVENOUS | Status: AC | PRN
  Administered 2015-01-16: 18 mL via INTRAVENOUS

## 2015-02-02 ENCOUNTER — Telehealth: Payer: Self-pay | Admitting: Neurology

## 2015-02-02 NOTE — Telephone Encounter (Signed)
Please let him know that Aricept may cause bowel changes (diarrhea or stomach upset) in some people. If tolerable for him, would continue Aricept 5mg  daily. Pls sent Rx #30 with 6 refills. Thanks

## 2015-02-02 NOTE — Telephone Encounter (Signed)
Patient called in to report that the Donepezil 5 mg is working fine with the exception of i slight change in his bowel habits and bladder habits at night. He wants to know if this was something he should be concerned bout or not. patient states he has about one week supply left and need a RX sent to the pharmacy if you want him to continue to take

## 2015-02-02 NOTE — Telephone Encounter (Signed)
Pt called wanting to speak to a nurse regarding his script DONEPEZIL 5mg . C/b [phone]

## 2015-02-04 NOTE — Telephone Encounter (Signed)
I did speak with patient. He did states that what he had been experiencing was tolerable. He just wanted to make sure that medication doesn't cause issues later on with his bladder, he was reading up on the medication and he read somewhere that medication could cause some issues with the bladder. He states that as long as it doesn't cause any long term effects that he would continue with medication.

## 2015-02-04 NOTE — Telephone Encounter (Signed)
Noted. Patient already has refills at his pharmacy.

## 2015-02-04 NOTE — Telephone Encounter (Signed)
See previous documentation from [place]. Please check to see if medication was sent in or if patient called back / Gayleen O[place],

## 2015-02-04 NOTE — Telephone Encounter (Signed)
Unable to reach patient [facility] waiting to hear back from patient before i send medication in

## 2015-02-04 NOTE — Telephone Encounter (Signed)
Noted. Continue for now. THanks

## 2015-02-16 ENCOUNTER — Telehealth: Payer: Self-pay | Admitting: Internal Medicine

## 2015-02-16 NOTE — Telephone Encounter (Signed)
Patient need re-fill on HYDROcodone-acetaminophen (NORCO) 10-325 MG per tablet.patient is aware Dr. Regis Bill is out of the office this week.

## 2015-02-17 NOTE — Telephone Encounter (Signed)
Will send to Dr. Sarajane Jews to see if he can authorize.

## 2015-02-19 MED ORDER — HYDROCODONE-ACETAMINOPHEN 10-325 MG PO TABS: ORAL_TABLET | ORAL | Status: DC

## 2015-02-19 NOTE — Telephone Encounter (Signed)
LMOM for the pt to pick up prescription at the front desk.

## 2015-02-19 NOTE — Telephone Encounter (Signed)
done

## 2015-02-23 ENCOUNTER — Telehealth: Payer: Self-pay | Admitting: Internal Medicine

## 2015-02-23 NOTE — Telephone Encounter (Signed)
Pt has lost/misplaced rx HYDROcodone-acetaminophen (NORCO) 10-325 MG per tablet.  Says it is probably somewhere in the house, but will bring it back if they find.  Would like dr Regis Bill to write another

## 2015-02-24 MED ORDER — HYDROCODONE-ACETAMINOPHEN 10-325 MG PO TABS: ORAL_TABLET | ORAL | Status: DC

## 2015-02-24 NOTE — Telephone Encounter (Signed)
Spoke to the pt.  Advised him that [facility] will be able to do this once and that she will not be able to refill again if lost.

## 2015-02-24 NOTE — Telephone Encounter (Signed)
Please contact patient about this   We can only do this once   .  xtra refill.  Will not refill next time lost .

## 2015-03-03 ENCOUNTER — Telehealth: Payer: Self-pay | Admitting: Internal Medicine

## 2015-03-03 NOTE — Telephone Encounter (Signed)
Patient Name: MYKAI WENDORF DOB: February 24, 1930 Initial Comment Caller states husband has the flu, nauseated, temp comes and goes, chills, body aches Nurse Assessment Nurse: Marcelline Deist, RN, Kermit Balo Date/Time (Eastern Time): 03/03/2015 10:50:47 AM Confirm and document reason for call. If symptomatic, describe symptoms. ---Caller states husband has the flu, nauseated, temp. comes and goes - up to 101, chills, body aches. Went to the pharmacy on Sunday, recommended 8 hr. Tylenol. Has the patient traveled out of the country within the last 30 days? ---Not Applicable Does the patient require triage? ---Yes Related visit to physician within the last 2 weeks? ---No Does the PT have any chronic conditions? (i.e. diabetes, asthma, etc.) ---Yes List chronic conditions. ---heart stents, cancer hx. Guidelines Guideline Title Affirmed Question Affirmed Notes Weakness (Generalized) and Fatigue [1] Fever > 101 F (38.3 C) AND [2] age > 52 Final Disposition User See Physician within 4 Hours (or PCP triage) Marcelline Deist, RN, Lynda Comments Caller states she isn't sure if she will be able to persuade him to go to a walk-in clinic or UC to be seen. Nurse unable to find availability on schedule today. Caller advised that this may be the flu & they can test him for that. She would like something called in if possible for him if he does not want to go out. uses [facility] on [address]. States his current temp. is normal at 98.5. has been taking 8 hr. Tylenol. Continues to have some nausea in am, no vomiting. Caller will try to increase his fluid intake.

## 2015-03-03 NOTE — Telephone Encounter (Signed)
Spoke to the pt.  He is feeling some better.  Able to drink and eat.  Will come see WP on 03/04/15.

## 2015-03-04 ENCOUNTER — Ambulatory Visit (INDEPENDENT_AMBULATORY_CARE_PROVIDER_SITE_OTHER)
Admission: RE | Admit: 2015-03-04 | Discharge: 2015-03-04 | Disposition: A | Discharge status: Home / Self Care | Payer: Medicare Other | Source: Ambulatory Visit | Attending: Internal Medicine | Admitting: Internal Medicine

## 2015-03-04 ENCOUNTER — Ambulatory Visit (INDEPENDENT_AMBULATORY_CARE_PROVIDER_SITE_OTHER): Payer: Medicare Other | Admitting: Internal Medicine

## 2015-03-04 ENCOUNTER — Encounter: Payer: Self-pay | Admitting: Internal Medicine

## 2015-03-04 VITALS — BP 148/88 | Temp 98.1°F | Ht 71.0 in | Wt 193.1 lb

## 2015-03-04 DIAGNOSIS — R6889 Other general symptoms and signs: Secondary | ICD-10-CM | POA: Diagnosis not present

## 2015-03-04 DIAGNOSIS — R509 Fever, unspecified: Secondary | ICD-10-CM

## 2015-03-04 LAB — CBC WITH DIFFERENTIAL/PLATELET
BASOS PCT: 0.3 % (ref 0.0–3.0)
Basophils Absolute: 0 10*3/uL (ref 0.0–0.1)
Eosinophils Absolute: 0.1 10*3/uL (ref 0.0–0.7)
Eosinophils Relative: 2.6 % (ref 0.0–5.0)
HEMATOCRIT: 41 % (ref 39.0–52.0)
HEMOGLOBIN: 13.7 g/dL (ref 13.0–17.0)
Lymphocytes Relative: 19.1 % (ref 12.0–46.0)
Lymphs Abs: 1 10*3/uL (ref 0.7–4.0)
MCHC: 33.5 g/dL (ref 30.0–36.0)
MCV: 87.4 fl (ref 78.0–100.0)
MONO ABS: 0.7 10*3/uL (ref 0.1–1.0)
MONOS PCT: 14.2 % — AB (ref 3.0–12.0)
NEUTROS ABS: 3.3 10*3/uL (ref 1.4–7.7)
Neutrophils Relative %: 63.8 % (ref 43.0–77.0)
PLATELETS: 148 10*3/uL — AB (ref 150.0–400.0)
RBC: 4.68 Mil/uL (ref 4.22–5.81)
RDW: 13.5 % (ref 11.5–15.5)
WBC: 5.1 10*3/uL (ref 4.0–10.5)

## 2015-03-04 LAB — POCT URINALYSIS DIP (MANUAL ENTRY)
Bilirubin, UA: NEGATIVE
Glucose, UA: NEGATIVE
Ketones, POC UA: NEGATIVE
LEUKOCYTES UA: NEGATIVE
Nitrite, UA: NEGATIVE
Protein Ur, POC: 30
RBC UA: NEGATIVE
Spec Grav, UA: 1.015
UROBILINOGEN UA: 0.2
pH, UA: 5.5

## 2015-03-04 MED ORDER — LEVOFLOXACIN 750 MG PO TABS: 750.0000 mg | ORAL_TABLET | Freq: Every day | ORAL | Status: DC

## 2015-03-04 NOTE — Patient Instructions (Signed)
This does act like flu   Stay on the tamiflu  Get blood count x ray and urine tests to check for other causes of fever. Fever should be gone in the next 48 hours  If not may need receck.  Fluids rest etc.  Can take additional 1000mg  or tylenol equivalent per day in addition to your pain med if needed for pain and fever.

## 2015-03-04 NOTE — Addendum Note (Signed)
Addended by: Miles Costain T on: 03/04/2015 02:33 PM   Modules accepted: Orders

## 2015-03-04 NOTE — Progress Notes (Signed)
Pre visit review using our clinic review tool, if applicable. No additional management support is needed unless otherwise documented below in the visit note.  Chief Complaint  Patient presents with  . Fever    Sx started on Saturday.  Seen at [facility] yesterday and given Tamiflu.  Pt still wants to be seen today due to not feeling well.  . Nausea  . Generalized Body Aches  . Chills    HPI: Patient [name]  comes in today for SDA for  new problem evaluation. Here with wife  Onset of sudden  onset cold chills   Nausea  Visited friend passes  Church   Pancreatic cancer.   Achy.  Temp   Took tylenol.   101+  Then more 99   Last night had "roller coaster"  .  Colds sweats.   Just started coughin last .night  [facility] [address] after triage  Neg nasal swab  Placed on tamiflu and has had 3? Doses. Some nausea with this no vomiting Upper chest feels a bit tight no wheeze hemoptysis  Dry cough no rash  No uti sx  No flank pain  notick bites rash  New meds  Taking his pain meds every day also   ROS: See pertinent positives and negatives per HPI. No uti sx .   Past Medical History  Diagnosis Date  . CAD (coronary artery disease)     s/p Taxus DES to prox and mid LAD 06/2003;  LHC  1/07: LM 205, LAD stents ok., oD2 70% jailed (tx conservatively), mid to dist RCA 40-50%, EF 40-45%;  echo 2/07: EF 55-65%  . Hyperlipidemia   . COPD (chronic obstructive pulmonary disease)   . GERD (gastroesophageal reflux disease)   . Osteoarthritis   . Anemia   . Hypothyroidism   . Hyperglycemia   . Hiatal hernia   . Hemorrhoids   . History of renal calculi   . Diverticulosis of colon   . Adenomatous colon polyp   . Dermatitis   . Arthritis   . History of prostate cancer   . Migraines   . Transfusion history   . Prostate cancer     Family History  Problem Relation Age of Onset  . Hypertension Other   . Arthritis Other   . Cancer Other   . Rheum arthritis Daughter      History   Social History  . Marital Status: Married    Spouse Name: N/A  . Number of Children: N/A  . Years of Education: N/A   Occupational History  . Retired    Social History Main Topics  . Smoking status: Former Research scientist (life sciences)  . Smokeless tobacco: Never Used     Comment: Stopped in 1986  . Alcohol Use: No  . Drug Use: No  . Sexual Activity: Not on file   Other Topics Concern  . None   Social History Narrative   No regular exercise   HHof 2   Married wife no pets       Remote hx of tobacco stopped 1965   No etoh   REtired Investment banker, corporate level.    Outpatient Encounter Prescriptions as of 03/04/2015  Medication Sig  . donepezil (ARICEPT) 5 MG tablet Take 1 tablet daily  . HYDROcodone-acetaminophen (NORCO) 10-325 MG per tablet Take half a tablet every 5 hours as needed  . metoprolol tartrate (LOPRESSOR) 25 MG tablet TAKE 1 TABLET TWICE A DAY  . Multiple Vitamins-Minerals (OCUVITE  PRESERVISION) TABS Take 1 tablet by mouth 2 (two) times daily.    Marland Kitchen omeprazole (PRILOSEC OTC) 20 MG tablet Take 20 mg by mouth daily as needed.   Marland Kitchen OVER THE COUNTER MEDICATION Take 1 tablet by mouth 2 (two) times daily. Calcium Citrate 500mg  w/ Vitamin D 800mg   . pseudoephedrine (SUDAFED) 30 MG tablet Take 30 mg by mouth every 4 (four) hours as needed for congestion.   Marland Kitchen TAMIFLU 75 MG capsule   . atorvastatin (LIPITOR) 20 MG tablet Take 1 tablet (20 mg total) by mouth daily. (Patient not taking: Reported on 03/04/2015)    EXAM:  BP 148/88 mmHg  Temp(Src) 98.1 F (36.7 C) (Oral)  Ht 5\' 11"  (1.803 m)  Wt 193 lb 1.6 oz (87.59 kg)  BMI 26.94 kg/m2  Body mass index is 26.94 kg/(m^2).  GENERAL: vitals reviewed and listed above, alert, oriented, appears well hydrated and in no acute distress non toxic ill under the weather looking  HEENT: atraumatic, conjunctiva  clear, no obvious abnormalities on inspection of external nose and ears runny nose noted OP : no lesion edema or exudate  NECK: no  obvious masses on inspection palpation  LUNGS: clear to auscultation bilaterally, no wheezes, rales or rhonchi, ? If course bs base  CV: HRRR, no clubbing cyanosis or  peripheral edema nl cap refill  Abdomen:  Sof,t normal bowel sounds without hepatosplenomegaly, no guarding rebound or masses no CVA tenderness Skin: normal capillary refill ,turgor , color: No acute rashes ,petechiae or bruising MS: moves all extremities without noticeable focal  abnormality PSYCH: pleasant and cooperative no change cognition gait    ASSESSMENT AND PLAN:  Discussed the following assessment and plan:  Fever and chills - Plan: CBC with Differential/Platelet, POCT urinalysis dipstick, DG Chest 2 View  Flu-like symptoms - Plan: CBC with Differential/Platelet, POCT urinalysis dipstick, DG Chest 2 View Agree with staying on tamiflu at this time benefit mor than risk  Assess safe tylenol doses.  At hight risk  Get x ray close fu advised .   Expectant management.  -Patient advised to return or notify health care team  if symptoms worsen ,persist or new concerns arise.  Patient Instructions  This does act like flu   Stay on the tamiflu  Get blood count x ray and urine tests to check for other causes of fever. Fever should be gone in the next 48 hours  If not may need receck.  Fluids rest etc.  Can take additional 1000mg  or tylenol equivalent per day in addition to your pain med if needed for pain and fever.    [name]. Novah Goza M.D.

## 2015-03-12 ENCOUNTER — Ambulatory Visit (INDEPENDENT_AMBULATORY_CARE_PROVIDER_SITE_OTHER): Payer: Medicare Other | Admitting: Internal Medicine

## 2015-03-12 ENCOUNTER — Encounter: Payer: Self-pay | Admitting: Internal Medicine

## 2015-03-12 VITALS — BP 150/70 | HR 58 | Temp 98.1°F | Ht 71.0 in | Wt 189.2 lb

## 2015-03-12 DIAGNOSIS — J189 Pneumonia, unspecified organism: Secondary | ICD-10-CM | POA: Diagnosis not present

## 2015-03-12 DIAGNOSIS — I10 Essential (primary) hypertension: Secondary | ICD-10-CM | POA: Diagnosis not present

## 2015-03-12 NOTE — Progress Notes (Signed)
Pre visit review using our clinic review tool, if applicable. No additional management support is needed unless otherwise documented below in the visit note.  Chief Complaint  Patient presents with  . Follow-up    HPI: [name] 81 y.o.  Comes in for fu of PNA seen last week onset flu like sx on tamiflu and x ray showed left perihilar area poss pna/vs mass but needs fu x ray to r/o mass   No fever  About 50 % better dry cough ocass  finfshed levaquin 3 days ago  Had some se but ok now.   ROS: See pertinent positives and negatives per HPI. Saw dr Roni Bread for the hematuria and neg evaluation  Past Medical History  Diagnosis Date  . CAD (coronary artery disease)     s/p Taxus DES to prox and mid LAD 06/2003;  LHC  1/07: LM 205, LAD stents ok., oD2 70% jailed (tx conservatively), mid to dist RCA 40-50%, EF 40-45%;  echo 2/07: EF 55-65%  . Hyperlipidemia   . COPD (chronic obstructive pulmonary disease)   . GERD (gastroesophageal reflux disease)   . Osteoarthritis   . Anemia   . Hypothyroidism   . Hyperglycemia   . Hiatal hernia   . Hemorrhoids   . History of renal calculi   . Diverticulosis of colon   . Adenomatous colon polyp   . Dermatitis   . Arthritis   . History of prostate cancer   . Migraines   . Transfusion history   . Prostate cancer     Family History  Problem Relation Age of Onset  . Hypertension Other   . Arthritis Other   . Cancer Other   . Rheum arthritis Daughter   daughter has pulmonary ? Fibrosis  Or other disease eval for lung transplant at [facility] this week.  History   Social History  . Marital Status: Married    Spouse Name: N/A  . Number of Children: N/A  . Years of Education: N/A   Occupational History  . Retired    Social History Main Topics  . Smoking status: Former Research scientist (life sciences)  . Smokeless tobacco: Never Used     Comment: Stopped in 1986  . Alcohol Use: No  . Drug Use: No  . Sexual Activity: Not on file   Other Topics Concern  . None    Social History Narrative   No regular exercise   HHof 2   Married wife no pets       Remote hx of tobacco stopped 1965   No etoh   REtired Investment banker, corporate level.    Outpatient Encounter Prescriptions as of 03/12/2015  Medication Sig  . atorvastatin (LIPITOR) 20 MG tablet Take 1 tablet (20 mg total) by mouth daily.  Marland Kitchen donepezil (ARICEPT) 5 MG tablet Take 1 tablet daily  . HYDROcodone-acetaminophen (NORCO) 10-325 MG per tablet Take half a tablet every 5 hours as needed  . metoprolol tartrate (LOPRESSOR) 25 MG tablet TAKE 1 TABLET TWICE A DAY  . Multiple Vitamins-Minerals (OCUVITE PRESERVISION) TABS Take 1 tablet by mouth 2 (two) times daily.    Marland Kitchen omeprazole (PRILOSEC OTC) 20 MG tablet Take 20 mg by mouth daily as needed.   Marland Kitchen OVER THE COUNTER MEDICATION Take 1 tablet by mouth 2 (two) times daily. Calcium Citrate 500mg  w/ Vitamin D 800mg   . pseudoephedrine (SUDAFED) 30 MG tablet Take 30 mg by mouth every 4 (four) hours as needed for congestion.   . [DISCONTINUED] levofloxacin (LEVAQUIN)  750 MG tablet Take 1 tablet (750 mg total) by mouth daily.  . [DISCONTINUED] TAMIFLU 75 MG capsule     EXAM:  BP 150/70 mmHg  Pulse 58  Temp(Src) 98.1 F (36.7 C) (Oral)  Ht 5\' 11"  (1.803 m)  Wt 189 lb 3.2 oz (85.821 kg)  BMI 26.40 kg/m2  SpO2 97%  Body mass index is 26.4 kg/(m^2).  GENERAL: vitals reviewed and listed above, alert, oriented, appears well hydrated and in no acute distress looks tired non toxic  HEENT: atraumatic, conjunctiva  clear, no obvious abnormalities on inspection of external nose and ears NECK: no obvious masses on inspection palpation  LUNGS: clear to auscultation bilaterally, no wheezes, rales or rhonchi, xome coarse bs ? Right side  CV: HRRR, no clubbing cyanosis or   nl cap refill  MS: moves all extremities without noticeable focal  abnormality PSYCH: pleasant and cooperative,  ASSESSMENT AND PLAN:  Discussed the following assessment and plan:  Pneumonia  involving left lung, unspecified part of lung - perihilar opacity needs fy imaging 6 weeks - Plan: DG Chest 2 View  Essential hypertension - up today coming down on repeat  Plan 6 week x ray and rov pna and Chronic disease management  -Patient advised to return or notify health care team  if symptoms worsen ,persist or new concerns arise.  Patient Instructions  May take  Weeks to be better from pneumonia .  Plan repeat chest x ray in another  5 weeks  Plan x ray  May 11th or thereabouts  And thenROV  4 months and fu pna   Pneumonia Pneumonia is an infection of the lungs.  CAUSES Pneumonia may be caused by bacteria or a virus. Usually, these infections are caused by breathing infectious particles into the lungs (respiratory tract). SIGNS AND SYMPTOMS   Cough.  Fever.  Chest pain.  Increased rate of breathing.  Wheezing.  Mucus production. DIAGNOSIS  If you have the common symptoms of pneumonia, your health care provider will typically confirm the diagnosis with a chest X-ray. The X-ray will show an abnormality in the lung (pulmonary infiltrate) if you have pneumonia. Other tests of your blood, urine, or sputum may be done to find the specific cause of your pneumonia. Your health care provider may also do tests (blood gases or pulse oximetry) to see how well your lungs are working. TREATMENT  Some forms of pneumonia may be spread to other people when you cough or sneeze. You may be asked to wear a mask before and during your exam. Pneumonia that is caused by bacteria is treated with antibiotic medicine. Pneumonia that is caused by the influenza virus may be treated with an antiviral medicine. Most other viral infections must run their course. These infections will not respond to antibiotics.  HOME CARE INSTRUCTIONS   Cough suppressants may be used if you are losing too much rest. However, coughing protects you by clearing your lungs. You should avoid using cough suppressants if you  can.  Your health care provider may have prescribed medicine if he or she thinks your pneumonia is caused by bacteria or influenza. Finish your medicine even if you start to feel better.  Your health care provider may also prescribe an expectorant. This loosens the mucus to be coughed up.  Take medicines only as directed by your health care provider.  Do not smoke. Smoking is a common cause of bronchitis and can contribute to pneumonia. If you are a smoker and continue to smoke,  your cough may last several weeks after your pneumonia has cleared.  A cold steam vaporizer or humidifier in your room or home may help loosen mucus.  Coughing is often worse at night. Sleeping in a semi-upright position in a recliner or using a couple pillows under your head will help with this.  Get rest as you feel it is needed. Your body will usually let you know when you need to rest. PREVENTION A pneumococcal shot (vaccine) is available to prevent a common bacterial cause of pneumonia. This is usually suggested for:  People over 55 years old.  Patients on chemotherapy.  People with chronic lung problems, such as bronchitis or emphysema.  People with immune system problems. If you are over 65 or have a high risk condition, you may receive the pneumococcal vaccine if you have not received it before. In some countries, a routine influenza vaccine is also recommended. This vaccine can help prevent some cases of pneumonia.You may be offered the influenza vaccine as part of your care. If you smoke, it is time to quit. You may receive instructions on how to stop smoking. Your health care provider can provide medicines and counseling to help you quit. SEEK MEDICAL CARE IF: You have a fever. SEEK IMMEDIATE MEDICAL CARE IF:   Your illness becomes worse. This is especially true if you are elderly or weakened from any other disease.  You cannot control your cough with suppressants and are losing sleep.  You  begin coughing up blood.  You develop pain which is getting worse or is uncontrolled with medicines.  Any of the symptoms which initially brought you in for treatment are getting worse rather than better.  You develop shortness of breath or chest pain. MAKE SURE YOU:   Understand these instructions.  Will watch your condition.  Will get help right away if you are not doing well or get worse. Document Released: 11/21/2005 Document Revised: 04/07/2014 Document Reviewed: 02/10/2011 [facility] Patient Information 2015 [place], [place]. This information is not intended to replace advice given to you by your health care provider. Make sure you discuss any questions you have with your health care provider.      [name]. Liviah Cake M.D.

## 2015-03-12 NOTE — Patient Instructions (Addendum)
May take  Weeks to be better from pneumonia .  Plan repeat chest x ray in another  5 weeks  Plan x ray  May 11th or thereabouts  And thenROV  4 months and fu pna   Pneumonia Pneumonia is an infection of the lungs.  CAUSES Pneumonia may be caused by bacteria or a virus. Usually, these infections are caused by breathing infectious particles into the lungs (respiratory tract). SIGNS AND SYMPTOMS   Cough.  Fever.  Chest pain.  Increased rate of breathing.  Wheezing.  Mucus production. DIAGNOSIS  If you have the common symptoms of pneumonia, your health care provider will typically confirm the diagnosis with a chest X-ray. The X-ray will show an abnormality in the lung (pulmonary infiltrate) if you have pneumonia. Other tests of your blood, urine, or sputum may be done to find the specific cause of your pneumonia. Your health care provider may also do tests (blood gases or pulse oximetry) to see how well your lungs are working. TREATMENT  Some forms of pneumonia may be spread to other people when you cough or sneeze. You may be asked to wear a mask before and during your exam. Pneumonia that is caused by bacteria is treated with antibiotic medicine. Pneumonia that is caused by the influenza virus may be treated with an antiviral medicine. Most other viral infections must run their course. These infections will not respond to antibiotics.  HOME CARE INSTRUCTIONS   Cough suppressants may be used if you are losing too much rest. However, coughing protects you by clearing your lungs. You should avoid using cough suppressants if you can.  Your health care provider may have prescribed medicine if he or she thinks your pneumonia is caused by bacteria or influenza. Finish your medicine even if you start to feel better.  Your health care provider may also prescribe an expectorant. This loosens the mucus to be coughed up.  Take medicines only as directed by your health care provider.  Do not  smoke. Smoking is a common cause of bronchitis and can contribute to pneumonia. If you are a smoker and continue to smoke, your cough may last several weeks after your pneumonia has cleared.  A cold steam vaporizer or humidifier in your room or home may help loosen mucus.  Coughing is often worse at night. Sleeping in a semi-upright position in a recliner or using a couple pillows under your head will help with this.  Get rest as you feel it is needed. Your body will usually let you know when you need to rest. PREVENTION A pneumococcal shot (vaccine) is available to prevent a common bacterial cause of pneumonia. This is usually suggested for:  People over 33 years old.  Patients on chemotherapy.  People with chronic lung problems, such as bronchitis or emphysema.  People with immune system problems. If you are over 65 or have a high risk condition, you may receive the pneumococcal vaccine if you have not received it before. In some countries, a routine influenza vaccine is also recommended. This vaccine can help prevent some cases of pneumonia.You may be offered the influenza vaccine as part of your care. If you smoke, it is time to quit. You may receive instructions on how to stop smoking. Your health care provider can provide medicines and counseling to help you quit. SEEK MEDICAL CARE IF: You have a fever. SEEK IMMEDIATE MEDICAL CARE IF:   Your illness becomes worse. This is especially true if you are elderly or  weakened from any other disease.  You cannot control your cough with suppressants and are losing sleep.  You begin coughing up blood.  You develop pain which is getting worse or is uncontrolled with medicines.  Any of the symptoms which initially brought you in for treatment are getting worse rather than better.  You develop shortness of breath or chest pain. MAKE SURE YOU:   Understand these instructions.  Will watch your condition.  Will get help right away if  you are not doing well or get worse. Document Released: 11/21/2005 Document Revised: 04/07/2014 Document Reviewed: 02/10/2011 [facility] Patient Information 2015 [place], [place]. This information is not intended to replace advice given to you by your health care provider. Make sure you discuss any questions you have with your health care provider.

## 2015-03-30 ENCOUNTER — Ambulatory Visit: Payer: Medicare Other | Admitting: Internal Medicine

## 2015-04-06 ENCOUNTER — Telehealth: Payer: Self-pay | Admitting: Internal Medicine

## 2015-04-06 NOTE — Telephone Encounter (Signed)
Need re-fill on HYDROcodone-acetaminophen (NORCO) 10-325 MG per tablet.  He has an appointment tomorrow with Dr. Regis Bill.

## 2015-04-07 ENCOUNTER — Encounter: Payer: Self-pay | Admitting: Neurology

## 2015-04-07 ENCOUNTER — Ambulatory Visit (INDEPENDENT_AMBULATORY_CARE_PROVIDER_SITE_OTHER): Payer: Medicare Other | Admitting: Neurology

## 2015-04-07 VITALS — BP 120/78 | HR 62 | Resp 16 | Ht 71.0 in | Wt 186.0 lb

## 2015-04-07 DIAGNOSIS — R413 Other amnesia: Secondary | ICD-10-CM | POA: Diagnosis not present

## 2015-04-07 DIAGNOSIS — G3184 Mild cognitive impairment, so stated: Secondary | ICD-10-CM

## 2015-04-07 MED ORDER — HYDROCODONE-ACETAMINOPHEN 10-325 MG PO TABS: ORAL_TABLET | ORAL | Status: DC

## 2015-04-07 MED ORDER — DONEPEZIL HCL 5 MG PO TABS: ORAL_TABLET | ORAL | Status: DC

## 2015-04-07 NOTE — Patient Instructions (Signed)
1. Continue Aricept '5mg'$  daily 2. Physical exercise and brain stimulation exercises are important for brain health 3. Follow-up in 6 months

## 2015-04-07 NOTE — Telephone Encounter (Signed)
Ok to refill x 1  Make sure  Gets fu chest xray  Next week or just before next OV

## 2015-04-07 NOTE — Telephone Encounter (Signed)
[name] thought he had an appointment today but he is not schedule until 04/21/2015. Patient came in today stating that he needs a prescription refill on his hydrocodone.

## 2015-04-07 NOTE — Telephone Encounter (Signed)
Patient notified to pick up rx at the front desk.  He was reminded to get the chest x-ray.  Stated he would go the first of next week.

## 2015-04-07 NOTE — Progress Notes (Signed)
NEUROLOGY FOLLOW UP OFFICE NOTE  [name] [id number]  HISTORY OF PRESENT ILLNESS: I had the pleasure of seeing [name] in the neurology clinic on 04/07/2015. He is again accompanied by his wife who helps supplement the history today. The patient was last seen 3 months ago for worsening memory suggestive of mild cognitive impairment. MMSE at that time was 26/30. Records and images were personally reviewed where available. I personally reviewed MRI brain with and without contrast which showed chronic microvascular disease, mild global atrophy. B12 level normal 325. He was started on Aricept on his last visit, he is currently taking Aricept '5mg'$  daily with no side effects. His wife feels he is remembering things and routes better. He continues to have word-finding difficulties. They deny getting lost driving, no missed medications or bill payments. No difficulties with ADLs. He denies any headaches, dizziness, diplopia, focal numbness/tingling/weakness.  HPI: This is an 81 yo RH man with a history of hyperlipidemia, hypertension, prostate cancer s/p radiation, who presented with a 2-year history of memory loss. He mostly has difficulty finding words to express himself and pulling up words. Because of this, he does not talk as much as he used to. Symptoms worsen when he gets frustrated. He feels he is taking so much medications that could be affecting his thinking. He forgets names and conversations, occasionally misplaces things. His wife denies that he repeats himself. He denies getting lost driving. He denies missing medications or bill payments.  He denies any family history of dementia, however his brother had a brain tumor with memory changes, another brother has memory changes attributed to [employer].   PAST MEDICAL HISTORY: Past Medical History  Diagnosis Date  . CAD (coronary artery disease)     s/p Taxus DES to prox and mid LAD 06/2003;  LHC  1/07: LM 205, LAD stents  ok., oD2 70% jailed (tx conservatively), mid to dist RCA 40-50%, EF 40-45%;  echo 2/07: EF 55-65%  . Hyperlipidemia   . COPD (chronic obstructive pulmonary disease)   . GERD (gastroesophageal reflux disease)   . Osteoarthritis   . Anemia   . Hypothyroidism   . Hyperglycemia   . Hiatal hernia   . Hemorrhoids   . History of renal calculi   . Diverticulosis of colon   . Adenomatous colon polyp   . Dermatitis   . Arthritis   . History of prostate cancer   . Migraines   . Transfusion history   . Prostate cancer     MEDICATIONS: Current Outpatient Prescriptions on File Prior to Visit  Medication Sig Dispense Refill  . atorvastatin (LIPITOR) 20 MG tablet Take 1 tablet (20 mg total) by mouth daily. 90 tablet 3  . donepezil (ARICEPT) 5 MG tablet Take 1 tablet daily 30 tablet 6  . HYDROcodone-acetaminophen (NORCO) 10-325 MG per tablet Take half a tablet every 5 hours as needed 60 tablet 0  . metoprolol tartrate (LOPRESSOR) 25 MG tablet TAKE 1 TABLET TWICE A DAY 180 tablet 3  . Multiple Vitamins-Minerals (OCUVITE PRESERVISION) TABS Take 1 tablet by mouth 2 (two) times daily.      Marland Kitchen omeprazole (PRILOSEC OTC) 20 MG tablet Take 20 mg by mouth daily as needed.     Marland Kitchen OVER THE COUNTER MEDICATION Take 1 tablet by mouth 2 (two) times daily. Calcium Citrate '500mg'$  w/ Vitamin D '800mg'$     . [DISCONTINUED] Calcium Carbonate-Vitamin D (CALTRATE 600+D) 600-400 MG-UNIT per tablet Take 1 tablet by mouth 2 (  two) times daily.       No current facility-administered medications on file prior to visit.    ALLERGIES: Allergies  Allergen Reactions  . Pravastatin     ? Memory deficits improved when went off med  . Aspirin     Daily use causes bleeding in the bladder.  Can take occasionally  . Meperidine Hcl     REACTION: combative    FAMILY HISTORY: Family History  Problem Relation Age of Onset  . Hypertension Other   . Arthritis Other   . Cancer Other   . Rheum arthritis Daughter     SOCIAL  HISTORY: History   Social History  . Marital Status: Married    Spouse Name: N/A  . Number of Children: N/A  . Years of Education: N/A   Occupational History  . Retired    Social History Main Topics  . Smoking status: Former Research scientist (life sciences)  . Smokeless tobacco: Never Used     Comment: Stopped in 1986  . Alcohol Use: No  . Drug Use: No  . Sexual Activity: Not on file   Other Topics Concern  . Not on file   Social History Narrative   No regular exercise   HHof 2   Married wife no pets       Remote hx of tobacco stopped 1965   No etoh   REtired Investment banker, corporate level.    REVIEW OF SYSTEMS: Constitutional: No fevers, chills, or sweats, no generalized fatigue, change in appetite Eyes: No visual changes, double vision, eye pain Ear, nose and throat: No hearing loss, ear pain, nasal congestion, sore throat Cardiovascular: No chest pain, palpitations Respiratory:  No shortness of breath at rest or with exertion, wheezes GastrointestinaI: No nausea, vomiting, diarrhea, abdominal pain, fecal incontinence Genitourinary:  No dysuria, urinary retention or frequency Musculoskeletal:  No neck pain, back pain Integumentary: No rash, pruritus, skin lesions Neurological: as above Psychiatric: No depression, insomnia, anxiety Endocrine: No palpitations, fatigue, diaphoresis, mood swings, change in appetite, change in weight, increased thirst Hematologic/Lymphatic:  No anemia, purpura, petechiae. Allergic/Immunologic: no itchy/runny eyes, nasal congestion, recent allergic reactions, rashes  PHYSICAL EXAM: Filed Vitals:   04/07/15 0952  BP: 120/78  Pulse: 62  Resp: 16   General: No acute distress Head:  Normocephalic/atraumatic Neck: supple, no paraspinal tenderness, full range of motion Heart:  Regular rate and rhythm Lungs:  Clear to auscultation bilaterally Back: No paraspinal tenderness Skin/Extremities: No rash, no edema Neurological Exam: alert and oriented to person, place,  and time. No aphasia or dysarthria. Fund of knowledge is appropriate.  Recent and remote memory are intact. 3/3 delayed recall.  Attention and concentration are normal.    Able to name objects and repeat phrases. Cranial nerves: not tested CN II: pupils equal, round and reactive to light, visual fields intact, fundi unremarkable. CN III, IV, VI: full range of motion, no nystagmus, no ptosis CN V: facial sensation intact CN VII: upper and lower face symmetric CN VIII: hearing intact to finger rub CN IX, X: gag intact, uvula midline CN XI: sternocleidomastoid and trapezius muscles intact CN XII: tongue midline Bulk & Tone: normal, no cogwheeling, no fasciculations. Motor: 5/5 throughout with no pronator drift. Sensation: decreased in both LE. No extinction to double simultaneous stimulation. Romberg test negative Deep Tendon Reflexes: +2 on both UE and left patella, +1 right patella, absent ankle jerks bilaterally, no ankle clonus Plantar responses: downgoing bilaterally Cerebellar: no incoordination on finger to nose, heel to shin. No  dysdiadochokinesia Gait: narrow-based and steady, able to tandem walk adequately Tremor: none  IMPRESSION: This is an 81 yo RH man with hypertension, hyperlipidemia, prostate cancer s/p radiation, with memory changes suggestive of mild cognitive impairment. MMSE  In February 2016 was 26/30. 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 MRI brain unremarkable. His wife feels he is doing better on low dose Aricept '5mg'$  daily. Continue current medications. We again discussed the importance of control of vascular risk factors, physical exercise and brain stimulation exercises for brain health. He will Hughes in 6 months.   Thank you for allowing me to participate in his care.  Please do not hesitate to call for any questions or concerns.  The duration of this appointment visit was 15 minutes of face-to-face time with the patient.  Greater than  50% of this time was spent in counseling, explanation of diagnosis, planning of further management, and coordination of care.   Ellouise Newer, M.D.   CC: Dr. Regis Bill

## 2015-04-07 NOTE — Telephone Encounter (Signed)
Printed for [facility] to sign.

## 2015-04-10 ENCOUNTER — Ambulatory Visit (INDEPENDENT_AMBULATORY_CARE_PROVIDER_SITE_OTHER)
Admission: RE | Admit: 2015-04-10 | Discharge: 2015-04-10 | Disposition: A | Discharge status: Home / Self Care | Payer: Medicare Other | Source: Ambulatory Visit | Attending: Internal Medicine | Admitting: Internal Medicine

## 2015-04-10 DIAGNOSIS — J189 Pneumonia, unspecified organism: Secondary | ICD-10-CM

## 2015-04-13 DIAGNOSIS — R413 Other amnesia: Secondary | ICD-10-CM | POA: Insufficient documentation

## 2015-04-13 DIAGNOSIS — G3184 Mild cognitive impairment, so stated: Secondary | ICD-10-CM | POA: Insufficient documentation

## 2015-04-21 ENCOUNTER — Ambulatory Visit: Payer: Medicare Other | Admitting: Internal Medicine

## 2015-05-01 ENCOUNTER — Telehealth: Payer: Self-pay | Admitting: Internal Medicine

## 2015-05-01 NOTE — Telephone Encounter (Signed)
Pt request refill of the following: HYDROcodone-acetaminophen (NORCO) 10-325 MG per tablet ° ° °Phamacy: ° °

## 2015-05-02 NOTE — Telephone Encounter (Signed)
Ok to refill x 1  

## 2015-05-05 MED ORDER — HYDROCODONE-ACETAMINOPHEN 10-325 MG PO TABS: ORAL_TABLET | ORAL | Status: DC

## 2015-05-05 NOTE — Telephone Encounter (Signed)
Left a message with the pt wife informing her that a prescription is ready.

## 2015-06-04 ENCOUNTER — Telehealth: Payer: Self-pay | Admitting: Internal Medicine

## 2015-06-04 MED ORDER — HYDROCODONE-ACETAMINOPHEN 10-325 MG PO TABS: ORAL_TABLET | ORAL | Status: DC

## 2015-06-04 NOTE — Telephone Encounter (Signed)
Pt request refill HYDROcodone-acetaminophen (NORCO) 10-325 MG per tablet °

## 2015-06-04 NOTE — Telephone Encounter (Signed)
Make rov appt and then give him one refill

## 2015-06-04 NOTE — Telephone Encounter (Signed)
Pt has made an appt on 06/25/15.  Rx printed and placed at the front desk.  Pt notified to pick up.

## 2015-06-25 ENCOUNTER — Ambulatory Visit (INDEPENDENT_AMBULATORY_CARE_PROVIDER_SITE_OTHER): Payer: Medicare Other | Admitting: Internal Medicine

## 2015-06-25 ENCOUNTER — Encounter: Payer: Self-pay | Admitting: Internal Medicine

## 2015-06-25 VITALS — BP 139/72 | HR 80 | Temp 98.1°F | Ht 67.0 in | Wt 192.7 lb

## 2015-06-25 DIAGNOSIS — I10 Essential (primary) hypertension: Secondary | ICD-10-CM

## 2015-06-25 DIAGNOSIS — G3184 Mild cognitive impairment, so stated: Secondary | ICD-10-CM

## 2015-06-25 DIAGNOSIS — R0602 Shortness of breath: Secondary | ICD-10-CM

## 2015-06-25 DIAGNOSIS — Z79891 Long term (current) use of opiate analgesic: Secondary | ICD-10-CM

## 2015-06-25 DIAGNOSIS — Z79899 Other long term (current) drug therapy: Secondary | ICD-10-CM

## 2015-06-25 DIAGNOSIS — Z5189 Encounter for other specified aftercare: Secondary | ICD-10-CM | POA: Diagnosis not present

## 2015-06-25 DIAGNOSIS — R52 Pain, unspecified: Secondary | ICD-10-CM

## 2015-06-25 MED ORDER — HYDROCODONE-ACETAMINOPHEN 10-325 MG PO TABS
ORAL_TABLET | ORAL | Status: DC
Start: 2015-06-25 — End: 2015-08-04

## 2015-06-25 NOTE — Patient Instructions (Signed)
Your blood pressure is elevated  today   And if stays up should add medication to decrease. To protect against stroke and heart attack and  Avoid memeory deterioration. We don't want bp to be too low    Either  Take blood pressure readings twice a day for 10 - 14  days  And then get [place] the readings    Would prefer your BP to be 145 and below    . We may need to adjust the medication or add a medication for hypertension.  Get another chest x ray   And   And   If normal then plan lung fucntion tests to see if  Inhalers may help your breathing .

## 2015-06-25 NOTE — Progress Notes (Addendum)
Pre visit review using our clinic review tool, if applicable. No additional management support is needed unless otherwise documented below in the visit note.  Chief Complaint  Patient presents with  . Follow-up    HPI: [name] 81 y.o.  comes in for chronic disease/ medication management  Bp has ben doing   Sob  Some    Didn't have a breather .  And helped in past .   Breathing is not back to baseline before he got pneumonia thinks it could just be from the intensive sick and older. Says that he is just feeling his age. No coughing or shortness of breath this morning exertion without chest pain. No syncope.  Pain pill   Taking  About 2 per day slitl uses a pillbox states it helps him get around. Has seen the neurologist who felt he had mild cognitive impairment decided to start Aricept with some improvement he also stopped Lipitor because there was a question whether it could be adding to the problem. "Would rather die from heart attack then get reduced memory the time." No longer sees the cardiologist because it's not helpful" they done even sit down and talk with you." ROS: See pertinent positives and negatives per HPI.  Past Medical History  Diagnosis Date  . CAD (coronary artery disease)     s/p Taxus DES to prox and mid LAD 06/2003;  LHC  1/07: LM 205, LAD stents ok., oD2 70% jailed (tx conservatively), mid to dist RCA 40-50%, EF 40-45%;  echo 2/07: EF 55-65%  . Hyperlipidemia   . COPD (chronic obstructive pulmonary disease)   . GERD (gastroesophageal reflux disease)   . Osteoarthritis   . Anemia   . Hypothyroidism   . Hyperglycemia   . Hiatal hernia   . Hemorrhoids   . History of renal calculi   . Diverticulosis of colon   . Adenomatous colon polyp   . Dermatitis   . Arthritis   . History of prostate cancer   . Migraines   . Transfusion history   . Prostate cancer     Family History  Problem Relation Age of Onset  . Hypertension Other   . Arthritis Other   .  Cancer Other   . Rheum arthritis Daughter     History   Social History  . Marital Status: Married    Spouse Name: N/A  . Number of Children: N/A  . Years of Education: N/A   Occupational History  . Retired    Social History Main Topics  . Smoking status: Former Research scientist (life sciences)  . Smokeless tobacco: Never Used     Comment: Stopped in 1986  . Alcohol Use: No  . Drug Use: No  . Sexual Activity: Not on file   Other Topics Concern  . None   Social History Narrative   No regular exercise   HHof 2   Married wife no pets       Remote hx of tobacco stopped 1965   No etoh   REtired Investment banker, corporate level.    Outpatient Prescriptions Prior to Visit  Medication Sig Dispense Refill  . donepezil (ARICEPT) 5 MG tablet Take 1 tablet daily 90 tablet 3  . metoprolol tartrate (LOPRESSOR) 25 MG tablet TAKE 1 TABLET TWICE A DAY 180 tablet 3  . Multiple Vitamins-Minerals (OCUVITE PRESERVISION) TABS Take 1 tablet by mouth 2 (two) times daily.      Marland Kitchen omeprazole (PRILOSEC OTC) 20 MG tablet Take 20 mg by  mouth daily as needed.     Marland Kitchen OVER THE COUNTER MEDICATION Take 1 tablet by mouth 2 (two) times daily. Calcium Citrate '500mg'$  w/ Vitamin D '800mg'$     . HYDROcodone-acetaminophen (NORCO) 10-325 MG per tablet Take half a tablet every 5 hours as needed 60 tablet 0  . atorvastatin (LIPITOR) 20 MG tablet Take 1 tablet (20 mg total) by mouth daily. (Patient not taking: Reported on 06/25/2015) 90 tablet 3   No facility-administered medications prior to visit.     EXAM:  BP 139/72 mmHg  Pulse 80  Temp(Src) 98.1 F (36.7 C) (Oral)  Ht '5\' 7"'$  (1.702 m)  Wt 192 lb 11.2 oz (87.408 kg)  BMI 30.17 kg/m2  SpO2 97%  Body mass index is 30.17 kg/(m^2).  GENERAL: vitals reviewed and listed above, alert, oriented, appears well hydrated and in no acute distress HEENT: atraumatic, conjunctiva  clear, no obvious abnormalities on inspection of external nose and ears NECK: no obvious masses on inspection palpation    LUNGS: clear to auscultation bilaterally, no wheezes, rales or rhonchi, good air movement CV: HRRR ocass premature beat , no clubbing cyanosis or  peripheral edema nl cap refill  MS: moves all extremities without noticeable focal  abnormality PSYCH: pleasant and cooperative, no obvious depression or anxiety Lab Results  Component Value Date   WBC 5.1 03/04/2015   HGB 13.7 03/04/2015   HCT 41.0 03/04/2015   PLT 148.0* 03/04/2015   GLUCOSE 94 12/15/2014   CHOL 110 12/17/2014   TRIG 78 12/17/2014   HDL 50 12/17/2014   LDLCALC 44 12/17/2014   ALT 11 12/17/2014   AST 16 12/17/2014   NA 141 12/15/2014   K 4.1 12/15/2014   CL 106 12/15/2014   CREATININE 0.9 12/15/2014   BUN 20 12/15/2014   CO2 30 12/15/2014   TSH 1.58 04/21/2014   INR 1.09 02/25/2012    ASSESSMENT AND PLAN:  Discussed the following assessment and plan:  Shortness of breath - psot pna x rayu clearing may not back to baseline in resp sx  - Plan: DG Chest 2 View  Essential hypertension - bp up today get confirmation and may need adust med if  not at goal want to avoid hypotension  Medication management  Chronically on opiate therapy - benefit seems mote than risk at this time  Pain management  Amnestic MCI (mild cognitive impairment with memory loss) - pt stopped statin  reviewe med .    Risk benefit of medication discussed.  BP Readings from Last 3 Encounters:  07/08/15 139/72  04/07/15 120/78  03/12/15 150/70    -Patient advised to return or notify health care team  if symptoms worsen ,persist or new concerns arise.  Patient Instructions  Your blood pressure is elevated  today   And if stays up should add medication to decrease. To protect against stroke and heart attack and  Avoid memeory deterioration. We don't want bp to be too low    Either  Take blood pressure readings twice a day for 10 - 14  days  And then get [place] the readings    Would prefer your BP to be 145 and below    . We may need to  adjust the medication or add a medication for hypertension.  Get another chest x ray   And   And   If normal then plan lung fucntion tests to see if  Inhalers may help your breathing .       Mariann Laster  Darnelle Going M.D.  Addendum. Patient sends in 2 weeks of blood pressure readings. Highest is 157 the majority are 133 1 40 588 with diastolics in the 32P to 49I. His pulse is in the 50s 60s. [name]. Darrin Apodaca M.D.  No action taken confirmed blood pressure control.

## 2015-08-03 ENCOUNTER — Telehealth: Payer: Self-pay | Admitting: Internal Medicine

## 2015-08-03 NOTE — Telephone Encounter (Signed)
Pt needs new rx hydrocodone. Pt needs rx by wed

## 2015-08-04 MED ORDER — HYDROCODONE-ACETAMINOPHEN 10-325 MG PO TABS: ORAL_TABLET | ORAL | Status: DC

## 2015-08-04 NOTE — Telephone Encounter (Signed)
Ok to refill x 1  

## 2015-08-04 NOTE — Telephone Encounter (Signed)
Pt's wife notified that rx is ready for pick up.

## 2015-08-11 ENCOUNTER — Other Ambulatory Visit: Payer: Self-pay | Admitting: Dermatology

## 2015-08-26 ENCOUNTER — Encounter: Payer: Self-pay | Admitting: Internal Medicine

## 2015-08-26 ENCOUNTER — Ambulatory Visit (INDEPENDENT_AMBULATORY_CARE_PROVIDER_SITE_OTHER): Payer: Medicare Other | Admitting: Internal Medicine

## 2015-08-26 VITALS — BP 148/70 | Temp 98.3°F | Ht 67.0 in | Wt 193.0 lb

## 2015-08-26 DIAGNOSIS — R0602 Shortness of breath: Secondary | ICD-10-CM

## 2015-08-26 DIAGNOSIS — Z5189 Encounter for other specified aftercare: Secondary | ICD-10-CM

## 2015-08-26 DIAGNOSIS — G3184 Mild cognitive impairment, so stated: Secondary | ICD-10-CM

## 2015-08-26 DIAGNOSIS — Z79899 Other long term (current) drug therapy: Secondary | ICD-10-CM

## 2015-08-26 DIAGNOSIS — R52 Pain, unspecified: Secondary | ICD-10-CM

## 2015-08-26 DIAGNOSIS — I10 Essential (primary) hypertension: Secondary | ICD-10-CM | POA: Diagnosis not present

## 2015-08-26 DIAGNOSIS — Z79891 Long term (current) use of opiate analgesic: Secondary | ICD-10-CM

## 2015-08-26 MED ORDER — HYDROCODONE-ACETAMINOPHEN 10-325 MG PO TABS: ORAL_TABLET | ORAL | Status: DC

## 2015-08-26 NOTE — Progress Notes (Signed)
Pre visit review using our clinic review tool, if applicable. No additional management support is needed unless otherwise documented below in the visit note.  Chief Complaint  Patient presents with  . Follow-up    meds bp    HPI: [name] 81 y.o.  comes in for chronic disease/ medication management   Bp  At home. Last note controlled. Has a question of why has to go back to cardiology when they just refill his medicines once year and 1 intervene. Denies any chest pain or change in his shortness of breath Resp Says it is better is going back to swimming 3 times a week after his skin biopsy on his left chest heels  Pain meds Takes it at half a pill every 5 hours keeps him from having to lay down from leg aches. No falling. Cognitive decline no on Aricept seems to help it from getting worse. Sees neurologist.  Urology has some areas around his bladder where he had radiation therapy for his prostate cancer following. Cancer radiation. ROS: See pertinent positives and negatives per HPI.  Past Medical History  Diagnosis Date  . CAD (coronary artery disease)     s/p Taxus DES to prox and mid LAD 06/2003;  LHC  1/07: LM 205, LAD stents ok., oD2 70% jailed (tx conservatively), mid to dist RCA 40-50%, EF 40-45%;  echo 2/07: EF 55-65%  . Hyperlipidemia   . COPD (chronic obstructive pulmonary disease)   . GERD (gastroesophageal reflux disease)   . Osteoarthritis   . Anemia   . Hypothyroidism   . Hyperglycemia   . Hiatal hernia   . Hemorrhoids   . History of renal calculi   . Diverticulosis of colon   . Adenomatous colon polyp   . Dermatitis   . Arthritis   . History of prostate cancer   . Migraines   . Transfusion history   . Prostate cancer     Family History  Problem Relation Age of Onset  . Hypertension Other   . Arthritis Other   . Cancer Other   . Rheum arthritis Daughter     Social History   Social History  . Marital Status: Married    Spouse Name: N/A  .  Number of Children: N/A  . Years of Education: N/A   Occupational History  . Retired    Social History Main Topics  . Smoking status: Former Research scientist (life sciences)  . Smokeless tobacco: Never Used     Comment: Stopped in 1986  . Alcohol Use: No  . Drug Use: No  . Sexual Activity: Not Asked   Other Topics Concern  . None   Social History Narrative   No regular exercise   HHof 2   Married wife no pets       Remote hx of tobacco stopped 1965   No etoh   REtired Investment banker, corporate level.    Outpatient Prescriptions Prior to Visit  Medication Sig Dispense Refill  . donepezil (ARICEPT) 5 MG tablet Take 1 tablet daily 90 tablet 3  . metoprolol tartrate (LOPRESSOR) 25 MG tablet TAKE 1 TABLET TWICE A DAY 180 tablet 3  . Multiple Vitamins-Minerals (OCUVITE PRESERVISION) TABS Take 1 tablet by mouth 2 (two) times daily.      Marland Kitchen omeprazole (PRILOSEC OTC) 20 MG tablet Take 20 mg by mouth daily as needed.     Marland Kitchen OVER THE COUNTER MEDICATION Take 1 tablet by mouth 2 (two) times daily. Calcium Citrate '500mg'$  w/ Vitamin  D '800mg'$     . HYDROcodone-acetaminophen (NORCO) 10-325 MG per tablet Take half a tablet every 5 hours as needed 60 tablet 0  . atorvastatin (LIPITOR) 20 MG tablet Take 1 tablet (20 mg total) by mouth daily. (Patient not taking: Reported on 06/25/2015) 90 tablet 3   No facility-administered medications prior to visit.     EXAM:  BP 148/70 mmHg  Temp(Src) 98.3 F (36.8 C) (Oral)  Ht '5\' 7"'$  (1.702 m)  Wt 193 lb (87.544 kg)  BMI 30.22 kg/m2  Body mass index is 30.22 kg/(m^2).  GENERAL: vitals reviewed and listed above, alert, oriented, appears well hydrated and in no acute distress HEENT: atraumatic, conjunctiva  clear, no obvious abnormalities on inspection of external nose and ears  NECK: no obvious masses on inspection palpation  LUNGS: clear to auscultation bilaterally, no wheezes, rales or rhonchi, breath sounds are equal  CV: HRRR, no clubbing cyanosis or  peripheral edema nl cap  refill  MS: moves all extremities without noticeable focal  abnormalitygait can get up from chair and walk with pretty good balance and sit back down no unsteadiness. PSYCH: pleasant and cooperative, no obvious depression or anxiety Lab Results  Component Value Date   WBC 5.1 03/04/2015   HGB 13.7 03/04/2015   HCT 41.0 03/04/2015   PLT 148.0* 03/04/2015   GLUCOSE 94 12/15/2014   CHOL 110 12/17/2014   TRIG 78 12/17/2014   HDL 50 12/17/2014   LDLCALC 44 12/17/2014   ALT 11 12/17/2014   AST 16 12/17/2014   NA 141 12/15/2014   K 4.1 12/15/2014   CL 106 12/15/2014   CREATININE 0.9 12/15/2014   BUN 20 12/15/2014   CO2 30 12/15/2014   TSH 1.58 04/21/2014   INR 1.09 02/25/2012    ASSESSMENT AND PLAN:  Discussed the following assessment and plan:  Essential hypertension - home readings controlled  Chronically on opiate therapy  Amnestic MCI (mild cognitive impairment with memory loss) - stable   Pain management  Medication management  Shortness of breath - says better   4-5 months lab pre visit  Reviewed medicines at this time no change benefit more than risk discussed possibility of decreasing dosing with age. He states he feels better on than off medication. At this point have cardiology refill his meds because he's had a stent. Agree with aerobic exercise is tolerated consider PFTs if needed.  -Patient advised to return or notify health care team  if symptoms worsen ,persist or new concerns arise. Total visit 87mns > 50% spent counseling and coordinating care as indicated in above note and in instructions to patient .  Pt also asks about wifes status  Continued pain .  Years  Of it.    Patient Instructions  Glad you BP  Is ok . No chang ein med   . [facility]   [facility] .   Plan full   set of labs.  And wellness visit   In January February.    WStandley Brooking Panosh M.D.

## 2015-08-26 NOTE — Patient Instructions (Signed)
Glad you BP  Is ok . No chang ein med   . For now   [facility] to stay. on  Pain meds .   Plan full   set of labs.  And wellness visit   In January February.

## 2015-10-02 ENCOUNTER — Ambulatory Visit (INDEPENDENT_AMBULATORY_CARE_PROVIDER_SITE_OTHER)
Admission: RE | Admit: 2015-10-02 | Discharge: 2015-10-02 | Disposition: A | Discharge status: Home / Self Care | Payer: Medicare Other | Source: Ambulatory Visit | Attending: Internal Medicine | Admitting: Internal Medicine

## 2015-10-02 ENCOUNTER — Ambulatory Visit (INDEPENDENT_AMBULATORY_CARE_PROVIDER_SITE_OTHER): Payer: Medicare Other | Admitting: Internal Medicine

## 2015-10-02 ENCOUNTER — Telehealth: Payer: Self-pay | Admitting: Internal Medicine

## 2015-10-02 ENCOUNTER — Encounter: Payer: Self-pay | Admitting: Internal Medicine

## 2015-10-02 ENCOUNTER — Other Ambulatory Visit: Payer: Self-pay | Admitting: Internal Medicine

## 2015-10-02 VITALS — BP 130/90 | HR 54 | Temp 97.8°F | Wt 189.0 lb

## 2015-10-02 DIAGNOSIS — M2550 Pain in unspecified joint: Secondary | ICD-10-CM

## 2015-10-02 DIAGNOSIS — R5383 Other fatigue: Secondary | ICD-10-CM | POA: Diagnosis not present

## 2015-10-02 DIAGNOSIS — I10 Essential (primary) hypertension: Secondary | ICD-10-CM | POA: Diagnosis not present

## 2015-10-02 DIAGNOSIS — I2510 Atherosclerotic heart disease of native coronary artery without angina pectoris: Secondary | ICD-10-CM

## 2015-10-02 DIAGNOSIS — R938 Abnormal findings on diagnostic imaging of other specified body structures: Secondary | ICD-10-CM

## 2015-10-02 DIAGNOSIS — R079 Chest pain, unspecified: Secondary | ICD-10-CM

## 2015-10-02 DIAGNOSIS — R9389 Abnormal findings on diagnostic imaging of other specified body structures: Secondary | ICD-10-CM

## 2015-10-02 LAB — CBC WITH DIFFERENTIAL/PLATELET
BASOS PCT: 0.3 % (ref 0.0–3.0)
Basophils Absolute: 0 10*3/uL (ref 0.0–0.1)
EOS ABS: 0.3 10*3/uL (ref 0.0–0.7)
Eosinophils Relative: 3.8 % (ref 0.0–5.0)
HCT: 45 % (ref 39.0–52.0)
Hemoglobin: 15.1 g/dL (ref 13.0–17.0)
LYMPHS PCT: 17.5 % (ref 12.0–46.0)
Lymphs Abs: 1.3 10*3/uL (ref 0.7–4.0)
MCHC: 33.5 g/dL (ref 30.0–36.0)
MCV: 87.7 fl (ref 78.0–100.0)
MONO ABS: 0.6 10*3/uL (ref 0.1–1.0)
Monocytes Relative: 7.8 % (ref 3.0–12.0)
Neutro Abs: 5.2 10*3/uL (ref 1.4–7.7)
Neutrophils Relative %: 70.6 % (ref 43.0–77.0)
Platelets: 215 10*3/uL (ref 150.0–400.0)
RBC: 5.13 Mil/uL (ref 4.22–5.81)
RDW: 14.9 % (ref 11.5–15.5)
WBC: 7.4 10*3/uL (ref 4.0–10.5)

## 2015-10-02 LAB — COMPREHENSIVE METABOLIC PANEL
ALBUMIN: 4.1 g/dL (ref 3.5–5.2)
ALT: 10 U/L (ref 0–53)
AST: 13 U/L (ref 0–37)
Alkaline Phosphatase: 91 U/L (ref 39–117)
BUN: 20 mg/dL (ref 6–23)
CALCIUM: 9.9 mg/dL (ref 8.4–10.5)
CO2: 32 meq/L (ref 19–32)
CREATININE: 0.94 mg/dL (ref 0.40–1.50)
Chloride: 106 mEq/L (ref 96–112)
GFR: 81.38 mL/min (ref >60.00)
Glucose, Bld: 103 mg/dL — ABNORMAL HIGH (ref 70–99)
Potassium: 4.7 mEq/L (ref 3.5–5.1)
Sodium: 145 mEq/L (ref 135–145)
TOTAL PROTEIN: 6.6 g/dL (ref 6.0–8.3)
Total Bilirubin: 0.7 mg/dL (ref 0.2–1.2)

## 2015-10-02 LAB — SEDIMENTATION RATE: Sed Rate: 19 mm/hr (ref 0–22)

## 2015-10-02 LAB — TROPONIN I: TNIDX: 0.04 ug/L (ref 0.00–0.06)

## 2015-10-02 LAB — TSH: TSH: 1.04 u[IU]/mL (ref 0.35–4.50)

## 2015-10-02 NOTE — Patient Instructions (Addendum)
Will notify you  of labs when available. Get c xray   To check for pna  ekg looks normal     Except for slower rate not dangerous.  If  persistent or progressive call oncall service and or  go to ED for further evaluation.   Rest fluids  Check for fever

## 2015-10-02 NOTE — Telephone Encounter (Signed)
Patient Name: [name] DOB: [birth year] Initial Comment Caller states husband was having chest pain yesterday and this morning, sob and tightness Nurse Assessment Nurse: Ronnald Ramp, RN, Miranda Date/Time (Eastern Time): 10/02/2015 8:31:45 AM Confirm and document reason for call. If symptomatic, describe symptoms. ---Spoke with pt, states yesterday morning he had some tightness in his chest but then he also bent over and hit his chest on a counter. He is having tenderness in the his chest at the site. Also with some back pain. Has the patient traveled out of the country within the last 30 days? ---Not Applicable Does the patient have any new or worsening symptoms? ---Yes Will a triage be completed? ---Yes Related visit to physician within the last 2 weeks? ---No Does the PT have any chronic conditions? (i.e. diabetes, asthma, etc.) ---Yes List chronic conditions. ---Arthritis, HTN Guidelines Guideline Title Affirmed Question Affirmed Notes Chest Injury [1] High-risk adult (e.g., age > 58, osteoporosis, chronic steroid use) AND [2] still hurts Final Disposition User See Physician within 24 Hours Ronnald Ramp, RN, Miranda Comments Appt scheduled today with Dr. Regis Bill at 10:30am Referrals REFERRED TO PCP OFFICE Disagree/Comply: Comply

## 2015-10-02 NOTE — Progress Notes (Signed)
Pre visit review using our clinic review tool, if applicable. No additional management support is needed unless otherwise documented below in the visit note. Influenza immunization was not given due to patient refusal.    Chief Complaint  Patient presents with  . Chest Pain  . Fatigue    HPI: [name] 81 y.o. comes in for sda appt  today with wife as a work in sent in by team health .  he complains of just feeling fatigued over the last week. He has worked in the yard all whole lot lots of breaking and has had some upper chest pain and tightness the feels different than the heart pain. He just feels bad. No fever cough new shortness of breath vomiting rash bleeding. A family member has had mono. No new medicines. No falling injuries.   ROS: See pertinent positives and negatives per HPI. No syncope hemoptysis tachycardia known. Yesterday his wife said his chest tightness with she is in the upper chest was worse than it had been. He doesn't think it's his heart.  Past Medical History  Diagnosis Date  . CAD (coronary artery disease)     s/p Taxus DES to prox and mid LAD 06/2003;  LHC  1/07: LM 205, LAD stents ok., oD2 70% jailed (tx conservatively), mid to dist RCA 40-50%, EF 40-45%;  echo 2/07: EF 55-65%  . Hyperlipidemia   . COPD (chronic obstructive pulmonary disease) ([place])   . GERD (gastroesophageal reflux disease)   . Osteoarthritis   . Anemia   . Hypothyroidism   . Hyperglycemia   . Hiatal hernia   . Hemorrhoids   . History of renal calculi   . Diverticulosis of colon   . Adenomatous colon polyp   . Dermatitis   . Arthritis   . History of prostate cancer   . Migraines   . Transfusion history   . Prostate cancer [facility])     Family History  Problem Relation Age of Onset  . Hypertension Other   . Arthritis Other   . Cancer Other   . Rheum arthritis Daughter     Social History   Social History  . Marital Status: Married    Spouse Name: N/A  . Number of  Children: N/A  . Years of Education: N/A   Occupational History  . Retired    Social History Main Topics  . Smoking status: Former Research scientist (life sciences)  . Smokeless tobacco: Never Used     Comment: Stopped in 1986  . Alcohol Use: No  . Drug Use: No  . Sexual Activity: Not Asked   Other Topics Concern  . None   Social History Narrative   No regular exercise   HHof 2   Married wife no pets       Remote hx of tobacco stopped 1965   No etoh   REtired Investment banker, corporate level.    Outpatient Prescriptions Prior to Visit  Medication Sig Dispense Refill  . donepezil (ARICEPT) 5 MG tablet Take 1 tablet daily 90 tablet 3  . HYDROcodone-acetaminophen (NORCO) 10-325 MG per tablet Take half a tablet every 5 hours as needed 60 tablet 0  . metoprolol tartrate (LOPRESSOR) 25 MG tablet TAKE 1 TABLET TWICE A DAY 180 tablet 3  . Multiple Vitamins-Minerals (OCUVITE PRESERVISION) TABS Take 1 tablet by mouth 2 (two) times daily.      Marland Kitchen omeprazole (PRILOSEC OTC) 20 MG tablet Take 20 mg by mouth daily as needed.     Marland Kitchen  OVER THE COUNTER MEDICATION Take 1 tablet by mouth 2 (two) times daily. Calcium Citrate '500mg'$  w/ Vitamin D '800mg'$      No facility-administered medications prior to visit.     EXAM:  BP 130/90 mmHg  Pulse 54  Temp(Src) 97.8 F (36.6 C) (Oral)  Wt 189 lb (85.73 kg)  SpO2 98%  Body mass index is 29.59 kg/(m^2).  GENERAL: vitals reviewed and listed above, alert, oriented, appears well hydrated and in no acute distress he looks more tired than usual but not dyspneic speech is normal as well as eye contact HEENT: atraumatic, conjunctiva  clear, no obvious abnormalities on inspection of external nose and ears  TMs clearOP : no lesion edema or exudate  NECK: no obvious masses on inspection palpation  No JVD seen LUNGS: clear to auscultation bilaterally, no wheezes, rales or rhonchi,   Somewhat distant CV: HRRR, no clubbing cyanosis or  peripheral edema nl cap refill   abdomen soft without  organomegaly guarding or rebound there was a minor tenderness when touching the middle abdomen. MS: moves all extremities without noticeable focal  Abnormality slow gait but steady skin no acute rashes. PSYCH: pleasant and cooperative, no obvious depression or anxiety  EKG shows sinus bradycardia but no acute changes. ASSESSMENT AND PLAN:  Discussed the following assessment and plan:  Chest pain, unspecified chest pain type - Plan: DG Chest 2 View, EKG 12-Lead, TSH, Comprehensive metabolic panel, CBC with Differential/Platelet, Sedimentation rate, Troponin I  Other fatigue - Plan: DG Chest 2 View, TSH, Comprehensive metabolic panel, CBC with Differential/Platelet, Sedimentation rate, Troponin I  Arthralgia - Plan: DG Chest 2 View, TSH, Comprehensive metabolic panel, CBC with Differential/Platelet, Sedimentation rate, Troponin I  Atherosclerosis of native coronary artery of native heart without angina pectoris - Plan: DG Chest 2 View, TSH, Comprehensive metabolic panel, CBC with Differential/Platelet, Sedimentation rate, Troponin I  Essential hypertension - Plan: DG Chest 2 View, TSH, Comprehensive metabolic panel, CBC with Differential/Platelet, Sedimentation rate, Troponin I  Abnormal chest x-ray - lul opacity get ct   uncertain cause of his symptoms he has been doing excessive activity and his chest discomfort is very atypical could be pulmonary related or early infection. Check lab tests chest x-ray then plan follow-up in the meantime rest and hydration.  He has a history of coronary disease with a stent a  worseningchange in status care in the emergency room. -Patient advised to return or notify health care team  if symptoms worsen ,persist or new concerns arise. Total visit 15mns > 50% spent counseling and coordinating care as indicated in above note and in instructions to patient .   Patient Instructions  Will notify you  of labs when available. Get c xray   To check for pna  ekg  looks normal     Except for slower rate not dangerous.  If  persistent or progressive call oncall service and or  go to ED for further evaluation.   Rest fluids  Check for fever     WStandley Brooking Shanautica Forker M.D. Lab Results  Component Value Date   WBC 7.4 10/02/2015   HGB 15.1 10/02/2015   HCT 45.0 10/02/2015   PLT 215.0 10/02/2015   GLUCOSE 103* 10/02/2015   CHOL 110 12/17/2014   TRIG 78 12/17/2014   HDL 50 12/17/2014   LDLCALC 44 12/17/2014   ALT 10 10/02/2015   AST 13 10/02/2015   NA 145 10/02/2015   K 4.7 10/02/2015   CL 106 10/02/2015   CREATININE  0.94 10/02/2015   BUN 20 10/02/2015   CO2 32 10/02/2015   TSH 1.04 10/02/2015   INR 1.09 02/25/2012    labs normal so far chest x-ray showed a questionable opacity 1.3 cm in the left upper lobe c radiologist advise chest CT to further delineate.  we'll proceed with this and then plan follow-up.

## 2015-10-02 NOTE — Telephone Encounter (Signed)
Appointment made with Dr Regis Bill.  Spoke with patient and he was doing yard work yesterday and bent to pick up something and felt pain across his chest with some shortness of breath.  Patient states that now he has chest pain when he moves a certain way.  Dr Regis Bill aware.

## 2015-10-06 ENCOUNTER — Ambulatory Visit (INDEPENDENT_AMBULATORY_CARE_PROVIDER_SITE_OTHER)
Admission: RE | Admit: 2015-10-06 | Discharge: 2015-10-06 | Disposition: A | Discharge status: Home / Self Care | Payer: Medicare Other | Source: Ambulatory Visit | Attending: Internal Medicine | Admitting: Internal Medicine

## 2015-10-06 DIAGNOSIS — R9389 Abnormal findings on diagnostic imaging of other specified body structures: Secondary | ICD-10-CM

## 2015-10-06 DIAGNOSIS — R938 Abnormal findings on diagnostic imaging of other specified body structures: Secondary | ICD-10-CM

## 2015-10-08 ENCOUNTER — Telehealth: Payer: Self-pay | Admitting: Internal Medicine

## 2015-10-08 NOTE — Telephone Encounter (Signed)
Pt notified of CT results.

## 2015-10-08 NOTE — Telephone Encounter (Signed)
Pt would like blood work result  °

## 2015-10-09 ENCOUNTER — Encounter: Payer: Self-pay | Admitting: Neurology

## 2015-10-09 ENCOUNTER — Ambulatory Visit (INDEPENDENT_AMBULATORY_CARE_PROVIDER_SITE_OTHER): Payer: Medicare Other | Admitting: Neurology

## 2015-10-09 VITALS — BP 130/80 | HR 60 | Ht 71.0 in | Wt 190.0 lb

## 2015-10-09 DIAGNOSIS — G3184 Mild cognitive impairment, so stated: Secondary | ICD-10-CM

## 2015-10-09 DIAGNOSIS — R413 Other amnesia: Secondary | ICD-10-CM | POA: Diagnosis not present

## 2015-10-09 DIAGNOSIS — F411 Generalized anxiety disorder: Secondary | ICD-10-CM

## 2015-10-09 MED ORDER — PAROXETINE HCL 10 MG PO TABS: 10.0000 mg | ORAL_TABLET | Freq: Every day | ORAL | Status: DC

## 2015-10-09 MED ORDER — DONEPEZIL HCL 5 MG PO TABS: ORAL_TABLET | ORAL | Status: DC

## 2015-10-09 NOTE — Patient Instructions (Signed)
1. Continue Aricept '5mg'$  daily 2. Start Paxil '10mg'$  daily to help with easy frustration 3. Physical exercise and brain stimulation exercises are important for brain health 4. Follow-up in 6 months, call for any changes

## 2015-10-09 NOTE — Progress Notes (Addendum)
NEUROLOGY FOLLOW UP OFFICE NOTE  [name] [id number]  HISTORY OF PRESENT ILLNESS: I had the pleasure of seeing [name] in follow-up in the neurology clinic on 10/09/2015. He is again accompanied by his wife who helps supplement the history today. The patient was last seen 6 months ago for worsening memory suggestive of mild cognitive impairment. MMSE in February 2016 was 26/30. He and his wife report that short-term memory is unchanged. He continues to drive without getting lost, no missed bills or missed medications. No difficulties with ADLs. He is tolerating Aricept without side effects. His wife reports he can get very angry and irritable with her. He reports getting irritated when he cannot recall something.He denies any headaches, dizziness, diplopia, focal numbness/tingling/weakness, no falls.  HPI: This is a pleasant 81 yo RH man with a history of hyperlipidemia, hypertension, prostate cancer s/p radiation, who presented with a 2-year history of memory loss. He mostly has difficulty finding words to express himself and pulling up words. Because of this, he does not talk as much as he used to. Symptoms worsen when he gets frustrated. He feels he is taking so much medications that could be affecting his thinking. He forgets names and conversations, occasionally misplaces things. His wife denies that he repeats himself. He denies getting lost driving. He denies missing medications or bill payments.  He denies any family history of dementia, however his brother had a brain tumor with memory changes, another brother has memory changes attributed to [employer].   Diagnostic Data: Lab Results  Component Value Date   TSH 1.04 10/02/2015   Lab Results  Component Value Date   [id number] 01/07/2015   I personally reviewed MRI brain without and witho contrast which did not show any acute changes, there was chronic microvascular disease in the bilateral hemispheres, pons, few old  small vessel cerebellar infarctions.  PAST MEDICAL HISTORY: Past Medical History  Diagnosis Date  . CAD (coronary artery disease)     s/p Taxus DES to prox and mid LAD 06/2003;  LHC  1/07: LM 205, LAD stents ok., oD2 70% jailed (tx conservatively), mid to dist RCA 40-50%, EF 40-45%;  echo 2/07: EF 55-65%  . Hyperlipidemia   . COPD (chronic obstructive pulmonary disease) ([place])   . GERD (gastroesophageal reflux disease)   . Osteoarthritis   . Anemia   . Hypothyroidism   . Hyperglycemia   . Hiatal hernia   . Hemorrhoids   . History of renal calculi   . Diverticulosis of colon   . Adenomatous colon polyp   . Dermatitis   . Arthritis   . History of prostate cancer   . Migraines   . Transfusion history   . Prostate cancer [name])     MEDICATIONS: Current Outpatient Prescriptions on File Prior to Visit  Medication Sig Dispense Refill  . donepezil (ARICEPT) 5 MG tablet Take 1 tablet daily 90 tablet 3  . HYDROcodone-acetaminophen (NORCO) 10-325 MG per tablet Take half a tablet every 5 hours as needed 60 tablet 0  . metoprolol tartrate (LOPRESSOR) 25 MG tablet TAKE 1 TABLET TWICE A DAY 180 tablet 3  . Multiple Vitamins-Minerals (OCUVITE PRESERVISION) TABS Take 1 tablet by mouth 2 (two) times daily.      Marland Kitchen omeprazole (PRILOSEC OTC) 20 MG tablet Take 20 mg by mouth daily as needed.     Marland Kitchen OVER THE COUNTER MEDICATION Take 1 tablet by mouth 2 (two) times daily. Calcium Citrate '500mg'$  w/ Vitamin D  $'800mg'z$     . [DISCONTINUED] Calcium Carbonate-Vitamin D (CALTRATE 600+D) 600-400 MG-UNIT per tablet Take 1 tablet by mouth 2 (two) times daily.       No current facility-administered medications on file prior to visit.    ALLERGIES: Allergies  Allergen Reactions  . Pravastatin     ? Memory deficits improved when went off med  . Aspirin     Daily use causes bleeding in the bladder.  Can take occasionally  . Meperidine Hcl     REACTION: combative    FAMILY HISTORY: Family History  Problem  Relation Age of Onset  . Hypertension Other   . Arthritis Other   . Cancer Other   . Rheum arthritis Daughter     SOCIAL HISTORY: Social History   Social History  . Marital Status: Married    Spouse Name: N/A  . Number of Children: N/A  . Years of Education: N/A   Occupational History  . Retired    Social History Main Topics  . Smoking status: Former Research scientist (life sciences)  . Smokeless tobacco: Never Used     Comment: Stopped in 1986  . Alcohol Use: No  . Drug Use: No  . Sexual Activity: Not on file   Other Topics Concern  . Not on file   Social History Narrative   No regular exercise   HHof 2   Married wife no pets       Remote hx of tobacco stopped 1965   No etoh   REtired Investment banker, corporate level.    REVIEW OF SYSTEMS: Constitutional: No fevers, chills, or sweats, no generalized fatigue, change in appetite Eyes: No visual changes, double vision, eye pain Ear, nose and throat: No hearing loss, ear pain, nasal congestion, sore throat Cardiovascular: No chest pain, palpitations Respiratory:  No shortness of breath at rest or with exertion, wheezes GastrointestinaI: No nausea, vomiting, diarrhea, abdominal pain, fecal incontinence Genitourinary:  No dysuria, urinary retention or frequency Musculoskeletal:  No neck pain, back pain Integumentary: No rash, pruritus, skin lesions Neurological: as above Psychiatric: No depression, insomnia, anxiety Endocrine: No palpitations, fatigue, diaphoresis, mood swings, change in appetite, change in weight, increased thirst Hematologic/Lymphatic:  No anemia, purpura, petechiae. Allergic/Immunologic: no itchy/runny eyes, nasal congestion, recent allergic reactions, rashes  PHYSICAL EXAM: Filed Vitals:   10/09/15 1029  BP: 130/80  Pulse: 60   General: No acute distress Head:  Normocephalic/atraumatic Neck: supple, no paraspinal tenderness, full range of motion Heart:  Regular rate and rhythm Lungs:  Clear to auscultation  bilaterally Back: No paraspinal tenderness Skin/Extremities: No rash, no edema Neurological Exam: alert and oriented to person, place, and time. No aphasia or dysarthria. Fund of knowledge is appropriate.  Recent and remote memory are intact. Attention and concentration are normal.    Able to name objects and repeat phrases. Clock drawing test 3/5 MMSE - Mini Mental State Exam 10/09/2015 01/07/2015  Orientation to time 5 5  Orientation to Place 5 5  Registration 3 3  Attention/ Calculation 3 4  Recall 2 0  Language- name 2 objects 2 2  Language- repeat 1 1  Language- follow 3 step command 3 3  Language- read & follow direction 1 1  Write a sentence 1 1  Copy design 1 1  Total score 27 26   Cranial nerves:  CN II: pupils equal, round and reactive to light, visual fields intact, fundi unremarkable. CN III, IV, VI: full range of motion, no nystagmus, no ptosis CN V: facial sensation  intact CN VII: upper and lower face symmetric CN VIII: hearing intact to finger rub CN IX, X: gag intact, uvula midline CN XI: sternocleidomastoid and trapezius muscles intact CN XII: tongue midline Bulk & Tone: normal, no cogwheeling, no fasciculations. Motor: 5/5 throughout with no pronator drift. Sensation: decreased in both LE. No extinction to double simultaneous stimulation. Romberg test negative Deep Tendon Reflexes: +2 on both UE and left patella, +1 right patella, absent ankle jerks bilaterally, no ankle clonus Plantar responses: downgoing bilaterally Cerebellar: no incoordination on finger to nose Gait: narrow-based and steady, able to tandem walk adequately Tremor: none  IMPRESSION: This is an 81 yo RH man with hypertension, hyperlipidemia, prostate cancer s/p radiation, with memory changes suggestive of mild cognitive impairment. MMSE today 27/30 (26/30 in February 2016). MRI brain unremarkable. He reports memory is stable on low dose Aricept '5mg'$  daily, continue current dose. His wife  expressed concern about irritability and anger, we discussed starting an SSRI, he agrees to try Paxil '10mg'$  daily, side effects were discussed. We again discussed the importance of control of vascular risk factors, physical exercise and brain stimulation exercises for brain health. He will follow-up in 6 months and knows to call for any problems.   Thank you for allowing me to participate in his care.  Please do not hesitate to call for any questions or concerns.  The duration of this appointment visit was 24 minutes of face-to-face time with the patient.  Greater than 50% of this time was spent in counseling, explanation of diagnosis, planning of further management, and coordination of care.   Ellouise Newer, M.D.   CC: Dr. Regis Bill

## 2015-10-10 DIAGNOSIS — F411 Generalized anxiety disorder: Secondary | ICD-10-CM | POA: Insufficient documentation

## 2015-10-13 ENCOUNTER — Telehealth: Payer: Self-pay | Admitting: Neurology

## 2015-10-13 NOTE — Telephone Encounter (Signed)
I spoke with patient and notified him of advisement. He will let us know if he has anymore issues with the medication.

## 2015-10-13 NOTE — Telephone Encounter (Signed)
Hold on dose for 2 days, if better, try taking 1/2 tablet for the next month, call if any problems, we will slowly increase it if tolerated. thanks

## 2015-10-13 NOTE — Telephone Encounter (Signed)
States that he started Paxil yesterday. States he took pill at about 10 am and around noon states he started feeling "woozy", diarrhea(he had to wear a diaper), nausea, dry heaves, fatigue. He states that he if feeling better today. He hasn't taking it today.

## 2015-10-13 NOTE — Telephone Encounter (Signed)
PT called in regards to a reaction to his medication Paroxetine/Dawn  CB# [phone]

## 2015-10-22 ENCOUNTER — Telehealth: Payer: Self-pay | Admitting: Neurology

## 2015-10-22 NOTE — Telephone Encounter (Signed)
Lmovm to rtn my call. 

## 2015-10-22 NOTE — Telephone Encounter (Signed)
Patient returned my call. He states that he is still getting sick from the Paxil even after cutting tablet in half. Per Dr. Delice Lesch advised patient to stop taking med.

## 2015-10-22 NOTE — Telephone Encounter (Signed)
PT called and said he is having a lot of trouble with his medication Paxil/Dawn CB# [phone]

## 2015-11-16 ENCOUNTER — Other Ambulatory Visit: Payer: Self-pay | Admitting: Cardiology

## 2015-11-16 NOTE — Telephone Encounter (Signed)
REFILL 

## 2015-12-08 ENCOUNTER — Telehealth: Payer: Self-pay | Admitting: Internal Medicine

## 2015-12-08 MED ORDER — HYDROCODONE-ACETAMINOPHEN 10-325 MG PO TABS: ORAL_TABLET | ORAL | Status: DC

## 2015-12-08 NOTE — Telephone Encounter (Signed)
Pt needs new rx hydrocodone °

## 2015-12-08 NOTE — Telephone Encounter (Signed)
Patient's wife notified to pick up at the front desk when she picks up her own rx.

## 2015-12-08 NOTE — Telephone Encounter (Signed)
Ok x 1

## 2015-12-16 ENCOUNTER — Encounter (INDEPENDENT_AMBULATORY_CARE_PROVIDER_SITE_OTHER): Payer: Medicare Other | Admitting: Ophthalmology

## 2015-12-16 DIAGNOSIS — H43813 Vitreous degeneration, bilateral: Secondary | ICD-10-CM

## 2015-12-16 DIAGNOSIS — H338 Other retinal detachments: Secondary | ICD-10-CM | POA: Diagnosis not present

## 2015-12-16 DIAGNOSIS — H353134 Nonexudative age-related macular degeneration, bilateral, advanced atrophic with subfoveal involvement: Secondary | ICD-10-CM

## 2016-01-08 ENCOUNTER — Telehealth: Payer: Self-pay | Admitting: Internal Medicine

## 2016-01-08 MED ORDER — HYDROCODONE-ACETAMINOPHEN 10-325 MG PO TABS: ORAL_TABLET | ORAL | Status: DC

## 2016-01-08 NOTE — Telephone Encounter (Signed)
Ok x 1

## 2016-01-08 NOTE — Telephone Encounter (Signed)
Pt notified to pick up at the front desk. 

## 2016-01-08 NOTE — Telephone Encounter (Signed)
° ° °  Pt request refill of the following: ° ° °HYDROcodone-acetaminophen (NORCO) 10-325 MG tablet ° ° °Phamacy: °

## 2016-02-08 ENCOUNTER — Telehealth: Payer: Self-pay | Admitting: Internal Medicine

## 2016-02-08 MED ORDER — HYDROCODONE-ACETAMINOPHEN 10-325 MG PO TABS: ORAL_TABLET | ORAL | Status: DC

## 2016-02-08 NOTE — Telephone Encounter (Signed)
Left a message on home phone for pt to pick up at the front desk.

## 2016-02-08 NOTE — Telephone Encounter (Signed)
Ok x 1

## 2016-02-08 NOTE — Telephone Encounter (Signed)
Pt request refill  °HYDROcodone-acetaminophen (NORCO) 10-325 MG tablet °

## 2016-02-09 ENCOUNTER — Encounter: Payer: Self-pay | Admitting: Gastroenterology

## 2016-03-04 ENCOUNTER — Other Ambulatory Visit: Payer: Self-pay | Admitting: Cardiology

## 2016-03-08 ENCOUNTER — Telehealth: Payer: Self-pay | Admitting: Internal Medicine

## 2016-03-08 NOTE — Telephone Encounter (Signed)
Pt not home.  Will try again at a later time.

## 2016-03-08 NOTE — Telephone Encounter (Signed)
Due for  OV med check    Can refill x 1 and Ov before runs out.

## 2016-03-08 NOTE — Telephone Encounter (Signed)
° ° °  Pt request refill of the following: ° ° °HYDROcodone-acetaminophen (NORCO) 10-325 MG tablet ° ° °Phamacy: °

## 2016-03-09 MED ORDER — HYDROCODONE-ACETAMINOPHEN 10-325 MG PO TABS: ORAL_TABLET | ORAL | Status: DC

## 2016-03-09 NOTE — Telephone Encounter (Signed)
Pt scheduled for 03/22/16 @ 10:45.  Notified to pick up rx at the front desk.

## 2016-03-21 NOTE — Progress Notes (Signed)
Pre visit review using our clinic review tool, if applicable. No additional management support is needed unless otherwise documented below in the visit note.  Chief Complaint  Patient presents with  . Follow-up    meds    HPI: [name] 82 y.o.   Med evaluation  Last seen in [place] for pna and fu .    He fes his resp tolerance is back to normal no cough sob and can walk and garden and goe up 1 flight stairs without  Stopping. Vision is limiting under care .  stopepd the owrry pill caused NV  Just on the 5 aricep uncertain if helping   But stable  On memory  Name issues short term   Not getting lost   .  Taking pain med for  Leg pains and to keep going  Giving his own med  . No cv sx today  Or depression reported  Fu [name]  comes in today for follow up of  multiple medical problems.  Last saw dr Delice Lesch 1. Continue Aricept '5mg'$  daily 2. Start Paxil '10mg'$  daily to help with easy frustration 3. Physical exercise and brain stimulation exercises are important for brain health 4. Follow-up in 6 months, call for any changes ROS: See pertinent positives and negatives per HPI.  Past Medical History  Diagnosis Date  . CAD (coronary artery disease)     s/p Taxus DES to prox and mid LAD 06/2003;  LHC  1/07: LM 205, LAD stents ok., oD2 70% jailed (tx conservatively), mid to dist RCA 40-50%, EF 40-45%;  echo 2/07: EF 55-65%  . Hyperlipidemia   . COPD (chronic obstructive pulmonary disease) ([place])   . GERD (gastroesophageal reflux disease)   . Osteoarthritis   . Anemia   . Hypothyroidism   . Hyperglycemia   . Hiatal hernia   . Hemorrhoids   . History of renal calculi   . Diverticulosis of colon   . Adenomatous colon polyp   . Dermatitis   . Arthritis   . History of prostate cancer   . Migraines   . Transfusion history   . Prostate cancer [facility])     Family History  Problem Relation Age of Onset  . Hypertension Other   . Arthritis Other   . Cancer Other   . Rheum  arthritis Daughter     Social History   Social History  . Marital Status: Married    Spouse Name: N/A  . Number of Children: N/A  . Years of Education: N/A   Occupational History  . Retired    Social History Main Topics  . Smoking status: Former Research scientist (life sciences)  . Smokeless tobacco: Never Used     Comment: Stopped in 1986  . Alcohol Use: No  . Drug Use: No  . Sexual Activity: Not Asked   Other Topics Concern  . None   Social History Narrative   No regular exercise   HHof 2   Married wife no pets       Remote hx of tobacco stopped 1965   No etoh   REtired Investment banker, corporate level.    Outpatient Prescriptions Prior to Visit  Medication Sig Dispense Refill  . donepezil (ARICEPT) 5 MG tablet Take 1 tablet daily 90 tablet 3  . metoprolol tartrate (LOPRESSOR) 25 MG tablet TAKE 1 TABLET TWICE A DAY 180 tablet 0  . Multiple Vitamins-Minerals (OCUVITE PRESERVISION) TABS Take 1 tablet by mouth 2 (two) times daily.      Marland Kitchen  omeprazole (PRILOSEC OTC) 20 MG tablet Take 20 mg by mouth daily as needed.     Marland Kitchen OVER THE COUNTER MEDICATION Take 1 tablet by mouth 2 (two) times daily. Calcium Citrate '500mg'$  w/ Vitamin D '800mg'$     . HYDROcodone-acetaminophen (NORCO) 10-325 MG tablet Take half a tablet every 5 hours as needed 60 tablet 0  . PARoxetine (PAXIL) 10 MG tablet Take 1 tablet (10 mg total) by mouth daily. 90 tablet 3  . clobetasol (TEMOVATE) 0.05 % external solution      No facility-administered medications prior to visit.     EXAM:  BP 150/80 mmHg  Temp(Src) 98.7 F (37.1 C) (Oral)  Ht '5\' 11"'$  (1.803 m)  Wt 186 lb 1.6 oz (84.414 kg)  BMI 25.97 kg/m2  Body mass index is 25.97 kg/(m^2).  GENERAL: vitals reviewed and listed above, alert, oriented, appears well hydrated and in no acute distress HEENT: atraumatic, conjunctiva  clear, no obvious abnormalities on inspection of external nose and ears  waering glasses  NECK: no obvious masses on inspection palpation  LUNGS: clear to  auscultation bilaterally, no wheezes, rales or rhonchi, inc ap diameter but  bs =  CV: HRRR, no clubbing cyanosis or  peripheral edema nl cap refill  MS: moves all extremities without noticeable focal  Abnormality  Knee scars  Good raom able to walk get up onvtable without problem  PSYCH: pleasant and cooperative, no obvious depression or anxiety   ASSESSMENT AND PLAN:  Discussed the following assessment and plan:  Pain management  Chronically on opiate therapy  Medication management  Memory problem  Atherosclerosis of native coronary artery of native heart without angina pectoris considier getting pfts  But feels ok  And see no reason to do that test at this time  But will review record.  Declines updated pna23   Feels last shot assoc with illness and flu like.  No sx but  Copd is on his  Past hx  Notes  No obv dx  -Patient advised to return or notify health care team  if symptoms worsen ,persist or new concerns arise. reviewed  Risk benefit of medication discussed. Pain meds seems to be functioning   Ok on this    Total visit 8mns > 50% spent counseling and coordinating care as indicated in above note and in instructions to patient .   Patient Instructions  Will review    Consideration of pulmonary function tests  Seem that you lungs are doing fine now .   Walking is a good exercise for brain function.   ROV in 6 months   [place] Meeyah Ovitt M.D.

## 2016-03-22 ENCOUNTER — Encounter: Payer: Self-pay | Admitting: Internal Medicine

## 2016-03-22 ENCOUNTER — Ambulatory Visit (INDEPENDENT_AMBULATORY_CARE_PROVIDER_SITE_OTHER): Payer: Medicare Other | Admitting: Internal Medicine

## 2016-03-22 VITALS — BP 150/80 | Temp 98.7°F | Ht 71.0 in | Wt 186.1 lb

## 2016-03-22 DIAGNOSIS — Z79899 Other long term (current) drug therapy: Secondary | ICD-10-CM

## 2016-03-22 DIAGNOSIS — I2510 Atherosclerotic heart disease of native coronary artery without angina pectoris: Secondary | ICD-10-CM | POA: Diagnosis not present

## 2016-03-22 DIAGNOSIS — Z79891 Long term (current) use of opiate analgesic: Secondary | ICD-10-CM

## 2016-03-22 DIAGNOSIS — Z5189 Encounter for other specified aftercare: Secondary | ICD-10-CM | POA: Diagnosis not present

## 2016-03-22 DIAGNOSIS — R52 Pain, unspecified: Secondary | ICD-10-CM

## 2016-03-22 DIAGNOSIS — R413 Other amnesia: Secondary | ICD-10-CM | POA: Diagnosis not present

## 2016-03-22 MED ORDER — HYDROCODONE-ACETAMINOPHEN 10-325 MG PO TABS: ORAL_TABLET | ORAL | Status: DC

## 2016-03-22 NOTE — Patient Instructions (Signed)
Will review    Consideration of pulmonary function tests  Seem that you lungs are doing fine now .   Walking is a good exercise for brain function.   ROV in 6 months   Wellness   Labs

## 2016-04-04 ENCOUNTER — Telehealth: Payer: Self-pay | Admitting: Internal Medicine

## 2016-04-04 NOTE — Telephone Encounter (Signed)
Pt and his spouse are trying to get into [facility].  They dropped off paperwork 4/26. [facility] is requesting asap.

## 2016-04-05 ENCOUNTER — Telehealth: Payer: Self-pay | Admitting: Neurology

## 2016-04-05 NOTE — Telephone Encounter (Signed)
Just got it this week    Are they going to independent living? I will sign this but  Need that to be confirmed .

## 2016-04-06 DIAGNOSIS — Z7689 Persons encountering health services in other specified circumstances: Secondary | ICD-10-CM

## 2016-04-06 NOTE — Telephone Encounter (Signed)
I placed this in the box to go to the front desk.  Please fax and make a copy for the chart.  Thanks!

## 2016-04-06 NOTE — Telephone Encounter (Signed)
Pt and spouse are aware and will pick up this am.

## 2016-04-08 ENCOUNTER — Ambulatory Visit: Payer: Medicare Other | Admitting: Neurology

## 2016-04-18 NOTE — Telephone Encounter (Signed)
Closed

## 2016-05-10 ENCOUNTER — Telehealth: Payer: Self-pay | Admitting: Internal Medicine

## 2016-05-10 NOTE — Telephone Encounter (Signed)
Dr.Panosh out of the office - will route to Dr. Sarajane Jews for review.

## 2016-05-10 NOTE — Telephone Encounter (Signed)
° ° °  Pt request refill of the following: ° ° °HYDROcodone-acetaminophen (NORCO) 10-325 MG tablet ° ° °Phamacy: °

## 2016-05-11 MED ORDER — HYDROCODONE-ACETAMINOPHEN 10-325 MG PO TABS: ORAL_TABLET | ORAL | Status: DC

## 2016-05-11 NOTE — Telephone Encounter (Signed)
Left a message for the pt to pick up at the front desk.

## 2016-05-11 NOTE — Telephone Encounter (Signed)
done

## 2016-06-03 ENCOUNTER — Telehealth: Payer: Self-pay | Admitting: Internal Medicine

## 2016-06-03 MED ORDER — HYDROCODONE-ACETAMINOPHEN 10-325 MG PO TABS: ORAL_TABLET | ORAL | Status: DC

## 2016-06-03 NOTE — Telephone Encounter (Signed)
Pt notified to pick up at the front desk. 

## 2016-06-03 NOTE — Telephone Encounter (Signed)
Last  rx  Was  June 7th   Ok to refill x 1

## 2016-06-03 NOTE — Telephone Encounter (Signed)
Pt need new Rx for Hydrocodone °

## 2016-06-30 ENCOUNTER — Ambulatory Visit: Payer: Medicare Other | Admitting: Neurology

## 2016-07-01 ENCOUNTER — Ambulatory Visit: Payer: Medicare Other | Admitting: Neurology

## 2016-07-04 ENCOUNTER — Telehealth: Payer: Self-pay | Admitting: Internal Medicine

## 2016-07-04 NOTE — Telephone Encounter (Signed)
Pt needs new rx hydrocodone °

## 2016-07-04 NOTE — Telephone Encounter (Signed)
Ok to refill x 1  Due for yearly UDS screen  [facility] Please arrange for this

## 2016-07-05 ENCOUNTER — Encounter: Payer: Self-pay | Admitting: Internal Medicine

## 2016-07-05 MED ORDER — HYDROCODONE-ACETAMINOPHEN 10-325 MG PO TABS: ORAL_TABLET | ORAL | 0 refills | Status: DC

## 2016-07-05 NOTE — Telephone Encounter (Signed)
Spoke to the pt.  Advised that rx was ready for pick up at the front desk.  Advised pt to come prepared to leave a urine sample.  Pt agreed.

## 2016-07-07 ENCOUNTER — Ambulatory Visit: Payer: Medicare Other | Admitting: Neurology

## 2016-07-21 ENCOUNTER — Encounter: Payer: Self-pay | Admitting: Internal Medicine

## 2016-08-09 ENCOUNTER — Telehealth: Payer: Self-pay | Admitting: Internal Medicine

## 2016-08-09 MED ORDER — HYDROCODONE-ACETAMINOPHEN 10-325 MG PO TABS: ORAL_TABLET | ORAL | 0 refills | Status: DC

## 2016-08-09 NOTE — Telephone Encounter (Signed)
Pt need new Rx for Hydrocodone °

## 2016-08-09 NOTE — Telephone Encounter (Signed)
Patient's wife notified to pick up at the front desk on 08/10/16.

## 2016-08-09 NOTE — Telephone Encounter (Signed)
Ok to refill x 1  

## 2016-09-05 ENCOUNTER — Encounter: Payer: Self-pay | Admitting: Neurology

## 2016-09-05 ENCOUNTER — Ambulatory Visit (INDEPENDENT_AMBULATORY_CARE_PROVIDER_SITE_OTHER): Payer: Medicare Other | Admitting: Neurology

## 2016-09-05 VITALS — BP 138/78 | Temp 98.1°F | Ht 71.0 in | Wt 183.4 lb

## 2016-09-05 DIAGNOSIS — G3184 Mild cognitive impairment, so stated: Secondary | ICD-10-CM

## 2016-09-05 MED ORDER — DONEPEZIL HCL 10 MG PO TABS: 10.0000 mg | ORAL_TABLET | Freq: Every day | ORAL | 3 refills | Status: DC

## 2016-09-05 NOTE — Progress Notes (Signed)
NEUROLOGY FOLLOW UP OFFICE NOTE  [name] [id number]  HISTORY OF PRESENT ILLNESS: I had the pleasure of seeing [name] in follow-up in the neurology clinic on 09/05/2016. He is again accompanied by his wife who helps supplement the history today. The patient was last seen almost a year ago for worsening memory suggestive of mild cognitive impairment. MMSE in November 2016 was 27/30 (26/30 in February 2016). He and his wife report that short-term memory is unchanged, he mostly has difficulties with name retrieval. He drives short distances without getting lost, but has stopped driving long-distance due to vision problems. He has had double vision which affects his balance. He denies missing medications. His wife is in charge of bill payments. He is tolerating Donepezil '5mg'$  daily without side effects. On his last visit, his wife reported irritability and he was started on Paxil, which he did not tolerate and discontinued. She reports continued irritability and lack of patience both with his wife and himself. His mood is down because they feel isolated. No suicidal ideation. He denies any headaches, dizziness, diplopia, focal numbness/tingling/weakness, no falls.  HPI: This is a pleasant 82 yo RH man with a history of hyperlipidemia, hypertension, prostate cancer s/p radiation, who presented with a 2-year history of memory loss. He mostly has difficulty finding words to express himself and pulling up words. Because of this, he does not talk as much as he used to. Symptoms worsen when he gets frustrated. He feels he is taking so much medications that could be affecting his thinking. He forgets names and conversations, occasionally misplaces things. His wife denies that he repeats himself. He denies getting lost driving. He denies missing medications or bill payments.  He denies any family history of dementia, however his brother had a brain tumor with memory changes, another brother has memory  changes attributed to [employer].   Diagnostic Data: Lab Results  Component Value Date   TSH 1.04 10/02/2015   Lab Results  Component Value Date   [id number] 01/07/2015   I personally reviewed MRI brain without and witho contrast which did not show any acute changes, there was chronic microvascular disease in the bilateral hemispheres, pons, few old small vessel cerebellar infarctions.  PAST MEDICAL HISTORY: Past Medical History:  Diagnosis Date  . Adenomatous colon polyp   . Anemia   . Arthritis   . CAD (coronary artery disease)    s/p Taxus DES to prox and mid LAD 06/2003;  LHC  1/07: LM 205, LAD stents ok., oD2 70% jailed (tx conservatively), mid to dist RCA 40-50%, EF 40-45%;  echo 2/07: EF 55-65%  . COPD (chronic obstructive pulmonary disease) ([place])   . Dermatitis   . Diverticulosis of colon   . GERD (gastroesophageal reflux disease)   . Hemorrhoids   . Hiatal hernia   . History of prostate cancer   . History of renal calculi   . Hyperglycemia   . Hyperlipidemia   . Hypothyroidism   . Migraines   . Osteoarthritis   . Prostate cancer ([place])   . Transfusion history     MEDICATIONS: Current Outpatient Prescriptions on File Prior to Visit  Medication Sig Dispense Refill  . clobetasol (TEMOVATE) 0.05 % external solution APPLY TO AFFECTED AREA TWICE A DAY ON SCALP AS NEEDED (NOT TO FACE)  2  . donepezil (ARICEPT) 5 MG tablet Take 1 tablet daily 90 tablet 3  . fluorometholone (FML) 0.1 % ophthalmic suspension USE 1 DROP INTO INTO RIGHT  EYE 4 TIMES A DAY AS DIRECTED  3  . gatifloxacin (ZYMAXID) 0.5 % SOLN USE 1 DROP INTO RIGHT EYE 3 TIMES A DAY FOR 2 DAYS PRIOR TO AND 7 DAYS AFTER UNTIL FINISHED  2  . HYDROcodone-acetaminophen (NORCO) 10-325 MG tablet Take half a tablet every 5 hours as needed 60 tablet 0  . metoprolol tartrate (LOPRESSOR) 25 MG tablet TAKE 1 TABLET TWICE A DAY 180 tablet 0  . Multiple Vitamins-Minerals (OCUVITE PRESERVISION) TABS Take 1 tablet by  mouth 2 (two) times daily.      Marland Kitchen omeprazole (PRILOSEC OTC) 20 MG tablet Take 20 mg by mouth daily as needed.     Marland Kitchen OVER THE COUNTER MEDICATION Take 1 tablet by mouth 2 (two) times daily. Calcium Citrate '500mg'$  w/ Vitamin D '800mg'$     . triamcinolone lotion (KENALOG) 0.1 % APPLY 1 APPLICATION ON THE SKIN DAILY, APPLY TO SCALP TWO TIMES AS NEEDED FOR FLARES  3  . [DISCONTINUED] Calcium Carbonate-Vitamin D (CALTRATE 600+D) 600-400 MG-UNIT per tablet Take 1 tablet by mouth 2 (two) times daily.       No current facility-administered medications on file prior to visit.     ALLERGIES: Allergies  Allergen Reactions  . Pravastatin     ? Memory deficits improved when went off med  . Aspirin     Daily use causes bleeding in the bladder.  Can take occasionally  . Meperidine Hcl     REACTION: combative    FAMILY HISTORY: Family History  Problem Relation Age of Onset  . Hypertension Other   . Arthritis Other   . Cancer Other   . Rheum arthritis Daughter     SOCIAL HISTORY: Social History   Social History  . Marital status: Married    Spouse name: N/A  . Number of children: N/A  . Years of education: N/A   Occupational History  . Retired Retired   Social History Main Topics  . Smoking status: Former Research scientist (life sciences)  . Smokeless tobacco: Never Used     Comment: Stopped in 1986  . Alcohol use No  . Drug use: No  . Sexual activity: Not on file   Other Topics Concern  . Not on file   Social History Narrative   No regular exercise   HHof 2   Married wife no pets       Remote hx of tobacco stopped 1965   No etoh   REtired Investment banker, corporate level.    REVIEW OF SYSTEMS: Constitutional: No fevers, chills, or sweats, no generalized fatigue, change in appetite Eyes: No visual changes, double vision, eye pain Ear, nose and throat: No hearing loss, ear pain, nasal congestion, sore throat Cardiovascular: No chest pain, palpitations Respiratory:  No shortness of breath at rest or with  exertion, wheezes GastrointestinaI: No nausea, vomiting, diarrhea, abdominal pain, fecal incontinence Genitourinary:  No dysuria, urinary retention or frequency Musculoskeletal:  No neck pain, back pain Integumentary: No rash, pruritus, skin lesions Neurological: as above Psychiatric: No depression, insomnia, anxiety Endocrine: No palpitations, fatigue, diaphoresis, mood swings, change in appetite, change in weight, increased thirst Hematologic/Lymphatic:  No anemia, purpura, petechiae. Allergic/Immunologic: no itchy/runny eyes, nasal congestion, recent allergic reactions, rashes  PHYSICAL EXAM: Vitals:   09/05/16 1404  BP: 138/78  Temp: 98.1 F (36.7 C)   General: No acute distress Head:  Normocephalic/atraumatic Neck: supple, no paraspinal tenderness, full range of motion Heart:  Regular rate and rhythm Lungs:  Clear to auscultation bilaterally Back: No paraspinal  tenderness Skin/Extremities: No rash, no edema Neurological Exam: alert and oriented to person, place, and time. No aphasia or dysarthria. Fund of knowledge is appropriate.  Recent and remote memory are intact. Attention and concentration are normal.    Able to name objects and repeat phrases. Clock drawing test 2/5 MMSE - Mini Mental State Exam 09/05/2016 10/09/2015 01/07/2015  Orientation to time '5 5 5  '$ Orientation to Place '4 5 5  '$ Registration '3 3 3  '$ Attention/ Calculation '4 3 4  '$ Recall 2 2 0  Language- name 2 objects '2 2 2  '$ Language- repeat '1 1 1  '$ Language- follow 3 step command '2 3 3  '$ Language- read & follow direction '1 1 1  '$ Write a sentence '1 1 1  '$ Copy design 0 1 1  Total score '25 27 26   '$ Cranial nerves:  CN II: pupils equal, round and reactive to light, visual fields intact, fundi unremarkable. CN III, IV, VI: full range of motion, no nystagmus, no ptosis CN V: facial sensation intact CN VII: upper and lower face symmetric CN VIII: hearing intact to finger rub CN IX, X: gag intact, uvula midline CN XI:  sternocleidomastoid and trapezius muscles intact CN XII: tongue midline Bulk & Tone: normal, no cogwheeling, no fasciculations. Motor: 5/5 throughout with no pronator drift. Sensation: decreased in both LE. No extinction to double simultaneous stimulation. Romberg test negative Deep Tendon Reflexes: +2 on both UE and left patella, +1 right patella, absent ankle jerks bilaterally, no ankle clonus Plantar responses: downgoing bilaterally Cerebellar: no incoordination on finger to nose Gait: narrow-based and steady, able to tandem walk adequately Tremor: none  IMPRESSION: This is an 82 yo RH man with hypertension, hyperlipidemia, prostate cancer s/p radiation, with memory changes suggestive of mild cognitive impairment. MMSE today 25/30, previously 27/30 a year ago. He will increase Donepezil to '10mg'$  daily. We again discussed mood/irritability, he does not want to start another SSRI at this time, continue to monitor mood. We again discussed the importance of control of vascular risk factors, physical exercise and brain stimulation exercises for brain health. He will follow-up in 6 months and knows to call for any problems.   Thank you for allowing me to participate in his care.  Please do not hesitate to call for any questions or concerns.  The duration of this appointment visit was 25 minutes of face-to-face time with the patient.  Greater than 50% of this time was spent in counseling, explanation of diagnosis, planning of further management, and coordination of care.   Ellouise Newer, M.D.   CC: Dr. Regis Bill

## 2016-09-05 NOTE — Patient Instructions (Signed)
1. Increase Aricept to '10mg'$  daily. With your current bottle of Aricept '5mg'$ , take 2 tablets daily. Once finished, your new prescription will be for Aricept '10mg'$ , take 1 tablet daily 2. Continue to monitor mood 3. Physical exercise and brain stimulation exercises are important for brain health. 4. Follow-up in 6 months, call for any changes

## 2016-09-13 ENCOUNTER — Telehealth: Payer: Self-pay | Admitting: Internal Medicine

## 2016-09-13 NOTE — Telephone Encounter (Signed)
Pt needs new rx hydrocodone °

## 2016-09-14 ENCOUNTER — Other Ambulatory Visit (INDEPENDENT_AMBULATORY_CARE_PROVIDER_SITE_OTHER): Payer: Medicare Other

## 2016-09-14 DIAGNOSIS — Z0000 Encounter for general adult medical examination without abnormal findings: Secondary | ICD-10-CM

## 2016-09-14 LAB — BASIC METABOLIC PANEL
BUN: 22 mg/dL (ref 6–23)
CALCIUM: 9.3 mg/dL (ref 8.4–10.5)
CO2: 29 mEq/L (ref 19–32)
Chloride: 106 mEq/L (ref 96–112)
Creatinine, Ser: 0.94 mg/dL (ref 0.40–1.50)
GFR: 81.19 mL/min (ref >60.00)
GLUCOSE: 113 mg/dL — AB (ref 70–99)
Potassium: 3.9 mEq/L (ref 3.5–5.1)
Sodium: 143 mEq/L (ref 135–145)

## 2016-09-14 LAB — CBC WITH DIFFERENTIAL/PLATELET
BASOS ABS: 0 10*3/uL (ref 0.0–0.1)
Basophils Relative: 0.3 % (ref 0.0–3.0)
EOS ABS: 0.2 10*3/uL (ref 0.0–0.7)
Eosinophils Relative: 4.1 % (ref 0.0–5.0)
HEMATOCRIT: 42 % (ref 39.0–52.0)
HEMOGLOBIN: 14.1 g/dL (ref 13.0–17.0)
LYMPHS PCT: 24.9 % (ref 12.0–46.0)
Lymphs Abs: 1.3 10*3/uL (ref 0.7–4.0)
MCHC: 33.5 g/dL (ref 30.0–36.0)
MCV: 88.5 fl (ref 78.0–100.0)
MONOS PCT: 9.1 % (ref 3.0–12.0)
Monocytes Absolute: 0.5 10*3/uL (ref 0.1–1.0)
Neutro Abs: 3.2 10*3/uL (ref 1.4–7.7)
Neutrophils Relative %: 61.6 % (ref 43.0–77.0)
PLATELETS: 181 10*3/uL (ref 150.0–400.0)
RBC: 4.74 Mil/uL (ref 4.22–5.81)
RDW: 13.9 % (ref 11.5–15.5)
WBC: 5.2 10*3/uL (ref 4.0–10.5)

## 2016-09-14 LAB — LIPID PANEL
CHOLESTEROL: 150 mg/dL (ref 0–200)
HDL: 47.5 mg/dL (ref >39.00)
LDL Cholesterol: 84 mg/dL (ref 0–99)
NONHDL: 102.14
Total CHOL/HDL Ratio: 3
Triglycerides: 93 mg/dL (ref 0.0–149.0)
VLDL: 18.6 mg/dL (ref 0.0–40.0)

## 2016-09-14 LAB — HEPATIC FUNCTION PANEL
ALBUMIN: 3.8 g/dL (ref 3.5–5.2)
ALT: 9 U/L (ref 0–53)
AST: 12 U/L (ref 0–37)
Alkaline Phosphatase: 94 U/L (ref 39–117)
BILIRUBIN TOTAL: 0.8 mg/dL (ref 0.2–1.2)
Bilirubin, Direct: 0.2 mg/dL (ref 0.0–0.3)
Total Protein: 6.1 g/dL (ref 6.0–8.3)

## 2016-09-14 LAB — PSA: PSA: 0.03 ng/mL — AB (ref 0.10–4.00)

## 2016-09-14 LAB — TSH: TSH: 1.14 u[IU]/mL (ref 0.35–4.50)

## 2016-09-15 MED ORDER — HYDROCODONE-ACETAMINOPHEN 10-325 MG PO TABS
ORAL_TABLET | ORAL | 0 refills | Status: DC
Start: 2016-09-15 — End: 2016-09-27

## 2016-09-15 NOTE — Telephone Encounter (Signed)
Ok to refill x 1  

## 2016-09-15 NOTE — Telephone Encounter (Signed)
Left a message to pick up at the front desk.

## 2016-09-23 ENCOUNTER — Encounter: Payer: Medicare Other | Admitting: Internal Medicine

## 2016-09-27 ENCOUNTER — Ambulatory Visit (INDEPENDENT_AMBULATORY_CARE_PROVIDER_SITE_OTHER): Payer: Medicare Other | Admitting: Internal Medicine

## 2016-09-27 ENCOUNTER — Encounter: Payer: Self-pay | Admitting: Internal Medicine

## 2016-09-27 VITALS — BP 132/80 | HR 75 | Temp 98.0°F | Ht 71.0 in | Wt 180.6 lb

## 2016-09-27 DIAGNOSIS — Z79899 Other long term (current) drug therapy: Secondary | ICD-10-CM | POA: Diagnosis not present

## 2016-09-27 DIAGNOSIS — R413 Other amnesia: Secondary | ICD-10-CM

## 2016-09-27 DIAGNOSIS — Z0000 Encounter for general adult medical examination without abnormal findings: Secondary | ICD-10-CM | POA: Diagnosis not present

## 2016-09-27 DIAGNOSIS — R52 Pain, unspecified: Secondary | ICD-10-CM | POA: Diagnosis not present

## 2016-09-27 DIAGNOSIS — Z2821 Immunization not carried out because of patient refusal: Secondary | ICD-10-CM | POA: Insufficient documentation

## 2016-09-27 DIAGNOSIS — I2510 Atherosclerotic heart disease of native coronary artery without angina pectoris: Secondary | ICD-10-CM

## 2016-09-27 DIAGNOSIS — R5383 Other fatigue: Secondary | ICD-10-CM

## 2016-09-27 DIAGNOSIS — I10 Essential (primary) hypertension: Secondary | ICD-10-CM

## 2016-09-27 MED ORDER — HYDROCODONE-ACETAMINOPHEN 10-325 MG PO TABS: ORAL_TABLET | ORAL | 0 refills | Status: DC

## 2016-09-27 NOTE — Progress Notes (Signed)
Chief Complaint  Patient presents with  . Annual Exam    HPI: [name] 82 y.o. comes in today for preventive visit exam visit .Since last visit. He had no major changes in his health is hearing isn't the greatest family concern about memory difficulties see note below from Dr. Delice Lesch. He has been on Aricept short-term hasn't noticed any difference. Some difficulty with vision after eye surgery and process may get a second opinion. Also has had the top of his head after surgery not heal and have recurrent lesions. Has some concerns about his current dermatology situation. He states that he is down some difficulty with needed counseling at home wife is looking into counseling she's he states that she somewhat labile at times.   This is an 82 yo RH man with hypertension, hyperlipidemia, prostate cancer s/p radiation, with memory changes suggestive of mild cognitive impairment. MMSE today 25/30, previously 27/30 a year ago. He will increase Donepezil to '10mg'$  daily. We again discussed mood/irritability, he does not want to start another SSRI at this time, continue to monitor mood. We again discussed the importance of control of vascular risk factors, physical exercise and brain stimulation exercises for brain health. He will follow-up in 6 months and knows to call for any problems.   He states that half of the pain pill 4 times a day helps him do yard work wash the car and get along with his day. Denies any significant side effects. He does battle fatigue and low energy wonders if it's just age or other things but denies shortness of breath chest pain. Regard to cardiac status he hasn't been to the cardiologist in about 2 years because he states that they didn't do lab ahead of time and go over information with them. Doesn't want to be on a cholesterol medicine has had side effects in the past and states "I have to go some time not a better way to go" Health Maintenance  Topic Date Due  . PNA  vac Low Risk Adult (2 of 2 - PPSV23) 05/01/2015  . INFLUENZA VACCINE  07/05/2016  . TETANUS/TDAP  03/05/2017  . ZOSTAVAX  Completed   Health Maintenance Review LIFESTYLE:  Sugar beverages:n Sleep: interrupted some      ROS:  GEN/ HEENT: No fever, significant weight changes sweats headaches vision problems hearing changes, CV/ PULM; No chest pain shortness of breath cough, syncope,edema  change in exercise tolerance. GI /GU: No adominal pain, vomiting, change in bowel habits. No blood in the stool. No significant GU symptoms. SKIN/HEME: ,no acute skin rashes have some biopsy lesions on his scalp suspicious lesions or bleeding. No lymphadenopathy, nodules, masses.  NEURO/ PSYCH:  No newneurologic signs such as weakness numbness. ? depression anxiety. IMM/ Allergy: No unusual infections.  Allergy .   REST of 12 system review negative except as per HPI   Past Medical History:  Diagnosis Date  . Adenomatous colon polyp   . Anemia   . Arthritis   . CAD (coronary artery disease)    s/p Taxus DES to prox and mid LAD 06/2003;  LHC  1/07: LM 205, LAD stents ok., oD2 70% jailed (tx conservatively), mid to dist RCA 40-50%, EF 40-45%;  echo 2/07: EF 55-65%  . COPD (chronic obstructive pulmonary disease) ([place])   . Dermatitis   . Diverticulosis of colon   . GERD (gastroesophageal reflux disease)   . Hemorrhoids   . Hiatal hernia   . History of prostate cancer   .  History of renal calculi   . Hyperglycemia   . Hyperlipidemia   . Hypothyroidism   . Migraines   . Osteoarthritis   . Prostate cancer ([place])   . Transfusion history     Family History  Problem Relation Age of Onset  . Hypertension Other   . Arthritis Other   . Cancer Other   . Rheum arthritis Daughter     Social History   Social History  . Marital status: Married    Spouse name: N/A  . Number of children: N/A  . Years of education: N/A   Occupational History  . Retired Retired   Social History Main Topics  .  Smoking status: Former Research scientist (life sciences)  . Smokeless tobacco: Never Used     Comment: Stopped in 1986  . Alcohol use No  . Drug use: No  . Sexual activity: Not Asked   Other Topics Concern  . None   Social History Narrative   No regular exercise   HHof 2   Married wife no pets       Remote hx of tobacco stopped 1965   No etoh   REtired Investment banker, corporate level.    Outpatient Encounter Prescriptions as of 09/27/2016  Medication Sig  . clobetasol (TEMOVATE) 0.05 % external solution APPLY TO AFFECTED AREA TWICE A DAY ON SCALP AS NEEDED (NOT TO FACE)  . donepezil (ARICEPT) 10 MG tablet Take 1 tablet (10 mg total) by mouth at bedtime.  Marland Kitchen HYDROcodone-acetaminophen (NORCO) 10-325 MG tablet Take half a tablet every 5 hours as needed  . metoprolol tartrate (LOPRESSOR) 25 MG tablet TAKE 1 TABLET TWICE A DAY  . Multiple Vitamins-Minerals (OCUVITE PRESERVISION) TABS Take 1 tablet by mouth 2 (two) times daily.    Marland Kitchen omeprazole (PRILOSEC OTC) 20 MG tablet Take 20 mg by mouth daily as needed.   Marland Kitchen OVER THE COUNTER MEDICATION Take 1 tablet by mouth 2 (two) times daily. Calcium Citrate '500mg'$  w/ Vitamin D '800mg'$   . triamcinolone lotion (KENALOG) 0.1 % APPLY 1 APPLICATION ON THE SKIN DAILY, APPLY TO SCALP TWO TIMES AS NEEDED FOR FLARES  . [DISCONTINUED] HYDROcodone-acetaminophen (NORCO) 10-325 MG tablet Take half a tablet every 5 hours as needed  . [DISCONTINUED] fluorometholone (FML) 0.1 % ophthalmic suspension USE 1 DROP INTO INTO RIGHT EYE 4 TIMES A DAY AS DIRECTED  . [DISCONTINUED] gatifloxacin (ZYMAXID) 0.5 % SOLN USE 1 DROP INTO RIGHT EYE 3 TIMES A DAY FOR 2 DAYS PRIOR TO AND 7 DAYS AFTER UNTIL FINISHED   No facility-administered encounter medications on file as of 09/27/2016.     EXAM:  BP 132/80 (BP Location: Left Arm, Patient Position: Sitting, Cuff Size: Normal)   Pulse 75   Temp 98 F (36.7 C) (Oral)   Ht '5\' 11"'$  (1.803 m)   Wt 180 lb 9.6 oz (81.9 kg)   SpO2 95%   BMI 25.19 kg/m   Body mass  index is 25.19 kg/m.  Physical Exam: Vital signs reviewed VEL:FYBO is a well-developed well-nourished alert cooperative   who appears stated age in no acute distress. Glasses and hearing aids  HEENT: normocephalic atraumatic , Eyes: PERRL EOM's full, conjunctiva clear, Nares: paten,t no deformity discharge or tenderness., Ears: no deformity EAC's clear TMs with normal landmarks. Mouth: clear OP, no lesions, edema.  Moist mucous membranes. Dentition in adequate repair. NECK: supple without masses, thyromegaly or bruits. CHEST/PULM:  Clear to auscultation and percussion breath sounds equal no wheeze , rales or rhonchi.  Inc ap  diameter No chest wall deformities or tenderness. CV: PMI is nondisplaced, S1 S2 no gallops, murmurs, rubs. Peripheral pulses are fupresent without delay.No JVD . Feet look within normal limits without with no abnormal nails or keratosis does have hammertoe second on both sides without ulceration. ABDOMEN: Bowel sounds normal nontender  No guard or rebound, no hepato splenomegal no CVA tenderness.   Extremtities:  No clubbing cyanosis or edema, no acute joint swelling or redness no focal atrophy NEURO:  Oriented x3, cranial nerves 3-12 appear to be intact, no obvious focal weakness,gait within normal limits no abnormal reflexes or asymmetrical SKIN: No acute rashes normal turgor, color, no bruising or petechiae. Healing lesion on scalp and multiple sks  Etc  PSYCH: Oriented, good eye contact, no obvious depression anxiety, cognition and judgment appear normal. Interviewed with wife and without wife in the room. LN: no cervical axillary denopathy No noted deficits in , attention, and speech. Memory see neuro note    Lab Results  Component Value Date   WBC 5.2 09/14/2016   HGB 14.1 09/14/2016   HCT 42.0 09/14/2016   PLT 181.0 09/14/2016   GLUCOSE 113 (H) 09/14/2016   CHOL 150 09/14/2016   TRIG 93.0 09/14/2016   HDL 47.50 09/14/2016   LDLCALC 84 09/14/2016   ALT 9  09/14/2016   AST 12 09/14/2016   NA 143 09/14/2016   K 3.9 09/14/2016   CL 106 09/14/2016   CREATININE 0.94 09/14/2016   BUN 22 09/14/2016   CO2 29 09/14/2016   TSH 1.14 09/14/2016   PSA 0.03 (L) 09/14/2016   INR 1.09 02/25/2012   Lab reviewed with patient ASSESSMENT AND PLAN:  Discussed the following assessment and plan:  Visit for preventive health examination  Pain management - at thsi time bnefit risk  and no change functioning better with   Memory problem  Medication management  Influenza vaccination declined by patient  Essential hypertension  Atherosclerosis of native coronary artery of native heart without angina pectoris - at this time not ineterested in cardiology fu nor cholesterol medication will readdress  in future fu   Other fatigue - will follow and readdress  multifactorial causes of fatigue and  Appears healthy for age today   Counseling regarding home mood etc .    And home life  Advise return for wellness  Visit also  agrese with counseling if stress at home etc  Patient Care Team: Burnis Medin, MD as PCP - General (Internal Medicine) Irine Seal, MD as Attending Physician (Urology) Milus Banister, MD (Gastroenterology) Paralee Cancel, MD (Orthopedic Surgery) Lelon Perla, MD as Consulting Physician (Cardiology) dermatology  Patient Instructions  Eat hear t healthy will help brain heath  Avoid sugars and simple carbohydrates  Check eyes ears and skin. Update may help .   Ok to stay on pain med for now as benefit seems to be more than  Risk. ? If  Cholesterol  Medication .  Usually decreases risk  of  Recurrent  Heart events.   ROV in 4 months  .  Blood work good except sugar up some .   Can make  wellness visit with [name] our health coach and  Ov with me  On same  Or separated days .       [name] M.D.

## 2016-09-27 NOTE — Patient Instructions (Addendum)
Eat hear t healthy will help brain heath  Avoid sugars and simple carbohydrates  Check eyes ears and skin. Update may help .   Ok to stay on pain med for now as benefit seems to be more than  Risk. ? If  Cholesterol  Medication .  Usually decreases risk  of  Recurrent  Heart events.   ROV in 4 months  .  Blood work good except sugar up some .   Can make  wellness visit with Wynetta Fines our health coach and  Ov with me  On same  Or separated days .
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
## 2016-10-11 ENCOUNTER — Telehealth: Payer: Self-pay

## 2016-10-11 ENCOUNTER — Ambulatory Visit: Payer: Medicare Other

## 2016-10-11 NOTE — Telephone Encounter (Signed)
Call to Mr. [name]. Stated he wasn't feeling well. Did reschedule for 2/13 th at 10am

## 2016-10-18 ENCOUNTER — Telehealth: Payer: Self-pay | Admitting: Internal Medicine

## 2016-10-18 MED ORDER — HYDROCODONE-ACETAMINOPHEN 10-325 MG PO TABS: ORAL_TABLET | ORAL | 0 refills | Status: DC

## 2016-10-18 NOTE — Telephone Encounter (Signed)
Printed for [facility] to sign.

## 2016-10-18 NOTE — Telephone Encounter (Signed)
Pt request refill  °HYDROcodone-acetaminophen (NORCO) 10-325 MG tablet °

## 2016-10-18 NOTE — Telephone Encounter (Signed)
Ok to refill x 1  

## 2016-10-19 NOTE — Telephone Encounter (Signed)
Pt's wife notified to pick up at the front desk.

## 2016-11-08 NOTE — Progress Notes (Signed)
Pre visit review using our clinic review tool, if applicable. No additional management support is needed unless otherwise documented below in the visit note.  Chief Complaint  Patient presents with  . Left Axillary Lump    Lump first noticed yesterday.  Also had blood in his urine this morning and complains of pain while urinating.  . Dysuria  . Hematuria    HPI: [name] 82 y.o.  sda  Concern about lump under left armpit  Here with wife   Was in the shower yesterday and noticed left armpit had something like a bar so nontender noted yesterday. He hasn't noticed this before there is no injury. He also noted some dysuria hematuria like clots of blood and mucus this morning. To the high-dose antibiotic for his dental appointment.  He has been under evaluation for blood in his urine from Dr. Leia Alf hasn't had his follow-up yet. He got delayed until January.  He has a remote history of prostate cancer surgery and radiation years ago.   ROS: See pertinent positives and negatives per HPI. No fever or night sweats unusual weight loss other new symptoms. He did have pneumonia last year or earlier in the year.  He states that years ago he had a flu shot and pneumonia shot he believes at the same time time and got "very sick". This is not documented in the EHR   [place]. Predates this t ime .   Past Medical History:  Diagnosis Date  . Adenomatous colon polyp   . Anemia   . Arthritis   . CAD (coronary artery disease)    s/p Taxus DES to prox and mid LAD 06/2003;  LHC  1/07: LM 205, LAD stents ok., oD2 70% jailed (tx conservatively), mid to dist RCA 40-50%, EF 40-45%;  echo 2/07: EF 55-65%  . COPD (chronic obstructive pulmonary disease) ([place])   . Dermatitis   . Diverticulosis of colon   . GERD (gastroesophageal reflux disease)   . Hemorrhoids   . Hiatal hernia   . History of prostate cancer   . History of renal calculi   . Hyperglycemia   . Hyperlipidemia   . Hypothyroidism   .  Migraines   . Osteoarthritis   . Prostate cancer ([place])   . Transfusion history     Family History  Problem Relation Age of Onset  . Hypertension Other   . Arthritis Other   . Cancer Other   . Rheum arthritis Daughter     Social History   Social History  . Marital status: Married    Spouse name: N/A  . Number of children: N/A  . Years of education: N/A   Occupational History  . Retired Retired   Social History Main Topics  . Smoking status: Former Research scientist (life sciences)  . Smokeless tobacco: Never Used     Comment: Stopped in 1986  . Alcohol use No  . Drug use: No  . Sexual activity: Not Asked   Other Topics Concern  . None   Social History Narrative   No regular exercise   HHof 2   Married wife no pets       Remote hx of tobacco stopped 1965   No etoh   REtired Investment banker, corporate level.    Outpatient Medications Prior to Visit  Medication Sig Dispense Refill  . clobetasol (TEMOVATE) 0.05 % external solution APPLY TO AFFECTED AREA TWICE A DAY ON SCALP AS NEEDED (NOT TO FACE)  2  . donepezil (ARICEPT)  10 MG tablet Take 1 tablet (10 mg total) by mouth at bedtime. 90 tablet 3  . HYDROcodone-acetaminophen (NORCO) 10-325 MG tablet Take half a tablet every 5 hours as needed 60 tablet 0  . metoprolol tartrate (LOPRESSOR) 25 MG tablet TAKE 1 TABLET TWICE A DAY 180 tablet 0  . Multiple Vitamins-Minerals (OCUVITE PRESERVISION) TABS Take 1 tablet by mouth 2 (two) times daily.      Marland Kitchen omeprazole (PRILOSEC OTC) 20 MG tablet Take 20 mg by mouth daily as needed.     Marland Kitchen OVER THE COUNTER MEDICATION Take 1 tablet by mouth 2 (two) times daily. Calcium Citrate '500mg'$  w/ Vitamin D '800mg'$     . triamcinolone lotion (KENALOG) 0.1 % APPLY 1 APPLICATION ON THE SKIN DAILY, APPLY TO SCALP TWO TIMES AS NEEDED FOR FLARES  3   No facility-administered medications prior to visit.      EXAM:  BP (!) 150/78 (BP Location: Right Arm, Patient Position: Sitting, Cuff Size: Normal)   Temp 97.7 F (36.5 C) (Oral)    Wt 182 lb (82.6 kg)   BMI 25.38 kg/m   Body mass index is 25.38 kg/m.  GENERAL: vitals reviewed and listed above, alert, oriented, appears well hydrated and in no acute distress HEENT: atraumatic, conjunctiva  clear, no obvious abnormalities on inspection of external nose and ears NECK: no obvious masses on inspection palpation No supraclavicular node felt. LUNGS: clear to auscultation bilaterally, no wheezes, rales or rhonchi,  abd no organomegaly splenomegaly. Left axilla 4 cm ovoid like  hourglass shaped mobile firm lump noted. Right axilla is clear as well as cervical and inguinal region. CV: HRRR, no clubbing cyanosis or  peripheral edema nl cap refill  MS: moves all extremities without noticeable focal  Abnormality Skin on arms no acute lesion no redness normal range of motion of upper extremity joints. PSYCH: pleasant and cooperative  ASSESSMENT AND PLAN:  Discussed the following assessment and plan:  Axillary lump, left/mass - seems fairly quick onset  no other adenopathy - Plan: Ambulatory referral to General Surgery  Hematuria, unspecified type - Plan: POCT Urinalysis Dipstick (Automated)  Dysuria - Plan: POCT Urinalysis Dipstick (Automated)  PROSTATE CANCER, HX OF Concern about the consistency and size of the left axillary lump although it is mobile ovoid versus matted. No other adenopathy or masses felt today. Chest  CT scan a year ago with no adenopathy. In regard to the intermittent hematuria dysuria he has been seen by Dr. Jeffie Pollock for that but is due for a follow-up. Remote history of renal stones. He did have a high dose amoxicillin I believe pre-dental procedure today question whether it influence the urinalysis results. -Patient advised to return or notify health care team  if symptoms worsen ,persist or new concerns arise.  Patient Instructions  Uncertain cause of the lump under your left arm pit this could be a swollen lymph gland. I did not see any other swollen  lumps on your body. We need to evaluate the area. I will review your chart but we may do a scan of your chest in your abdomen. And also have a surgeon to feel the area to see if it needs to be biopsied.  You had a chest CT a little over a year ago that showed no acute findings. Urinalysis test today is fairly clear. I don't see signs of infection in the urine. I would have your urologist follow-up about this  . We can do urine culture to make sure there is no  infection.     [name]. Panosh M.D.

## 2016-11-09 ENCOUNTER — Encounter: Payer: Self-pay | Admitting: Internal Medicine

## 2016-11-09 ENCOUNTER — Ambulatory Visit (INDEPENDENT_AMBULATORY_CARE_PROVIDER_SITE_OTHER): Payer: Medicare Other | Admitting: Internal Medicine

## 2016-11-09 VITALS — BP 150/78 | Temp 97.7°F | Wt 182.0 lb

## 2016-11-09 DIAGNOSIS — R2232 Localized swelling, mass and lump, left upper limb: Secondary | ICD-10-CM

## 2016-11-09 DIAGNOSIS — R300 Dysuria: Secondary | ICD-10-CM

## 2016-11-09 DIAGNOSIS — Z8546 Personal history of malignant neoplasm of prostate: Secondary | ICD-10-CM | POA: Diagnosis not present

## 2016-11-09 DIAGNOSIS — R319 Hematuria, unspecified: Secondary | ICD-10-CM

## 2016-11-09 LAB — POC URINALSYSI DIPSTICK (AUTOMATED)
Bilirubin, UA: NEGATIVE
Glucose, UA: NEGATIVE
Ketones, UA: NEGATIVE
LEUKOCYTES UA: NEGATIVE
NITRITE UA: NEGATIVE
PROTEIN UA: 1
UROBILINOGEN UA: 0.2
pH, UA: 5.5

## 2016-11-09 NOTE — Patient Instructions (Addendum)
Uncertain cause of the lump under your left arm pit this could be a swollen lymph gland. I did not see any other swollen lumps on your body. We need to evaluate the area. I will review your chart but we may do a scan of your chest in your abdomen. And also have a surgeon to feel the area to see if it needs to be biopsied.  You had a chest CT a little over a year ago that showed no acute findings. Urinalysis test today is fairly clear. I don't see signs of infection in the urine. I would have your urologist follow-up about this  . We can do urine culture to make sure there is no infection.

## 2016-12-08 ENCOUNTER — Other Ambulatory Visit: Payer: Self-pay | Admitting: Surgery

## 2016-12-12 ENCOUNTER — Inpatient Hospital Stay (HOSPITAL_COMMUNITY)
Admission: EM | Admit: 2016-12-12 | Discharge: 2016-12-16 | DRG: 482 | Disposition: A | Discharge status: Skilled Nursing Facility | Payer: Medicare Other | Attending: Family Medicine | Admitting: Family Medicine

## 2016-12-12 ENCOUNTER — Emergency Department (HOSPITAL_COMMUNITY): Payer: Medicare Other

## 2016-12-12 ENCOUNTER — Encounter (HOSPITAL_COMMUNITY): Payer: Self-pay | Admitting: Emergency Medicine

## 2016-12-12 DIAGNOSIS — S72141A Displaced intertrochanteric fracture of right femur, initial encounter for closed fracture: Principal | ICD-10-CM | POA: Diagnosis present

## 2016-12-12 DIAGNOSIS — Z955 Presence of coronary angioplasty implant and graft: Secondary | ICD-10-CM

## 2016-12-12 DIAGNOSIS — Z79899 Other long term (current) drug therapy: Secondary | ICD-10-CM | POA: Diagnosis not present

## 2016-12-12 DIAGNOSIS — E039 Hypothyroidism, unspecified: Secondary | ICD-10-CM | POA: Diagnosis present

## 2016-12-12 DIAGNOSIS — Z87891 Personal history of nicotine dependence: Secondary | ICD-10-CM

## 2016-12-12 DIAGNOSIS — D649 Anemia, unspecified: Secondary | ICD-10-CM | POA: Diagnosis present

## 2016-12-12 DIAGNOSIS — G3184 Mild cognitive impairment, so stated: Secondary | ICD-10-CM | POA: Diagnosis present

## 2016-12-12 DIAGNOSIS — Z23 Encounter for immunization: Secondary | ICD-10-CM | POA: Diagnosis present

## 2016-12-12 DIAGNOSIS — R413 Other amnesia: Secondary | ICD-10-CM | POA: Diagnosis present

## 2016-12-12 DIAGNOSIS — I10 Essential (primary) hypertension: Secondary | ICD-10-CM | POA: Diagnosis present

## 2016-12-12 DIAGNOSIS — E785 Hyperlipidemia, unspecified: Secondary | ICD-10-CM | POA: Diagnosis present

## 2016-12-12 DIAGNOSIS — Z886 Allergy status to analgesic agent status: Secondary | ICD-10-CM

## 2016-12-12 DIAGNOSIS — M1990 Unspecified osteoarthritis, unspecified site: Secondary | ICD-10-CM | POA: Diagnosis present

## 2016-12-12 DIAGNOSIS — G43909 Migraine, unspecified, not intractable, without status migrainosus: Secondary | ICD-10-CM | POA: Diagnosis present

## 2016-12-12 DIAGNOSIS — Z8546 Personal history of malignant neoplasm of prostate: Secondary | ICD-10-CM | POA: Diagnosis not present

## 2016-12-12 DIAGNOSIS — I2510 Atherosclerotic heart disease of native coronary artery without angina pectoris: Secondary | ICD-10-CM | POA: Diagnosis present

## 2016-12-12 DIAGNOSIS — J449 Chronic obstructive pulmonary disease, unspecified: Secondary | ICD-10-CM | POA: Diagnosis present

## 2016-12-12 DIAGNOSIS — Z888 Allergy status to other drugs, medicaments and biological substances status: Secondary | ICD-10-CM

## 2016-12-12 DIAGNOSIS — Y92009 Unspecified place in unspecified non-institutional (private) residence as the place of occurrence of the external cause: Secondary | ICD-10-CM | POA: Diagnosis not present

## 2016-12-12 DIAGNOSIS — S72001A Fracture of unspecified part of neck of right femur, initial encounter for closed fracture: Secondary | ICD-10-CM | POA: Diagnosis present

## 2016-12-12 DIAGNOSIS — Z96653 Presence of artificial knee joint, bilateral: Secondary | ICD-10-CM | POA: Diagnosis present

## 2016-12-12 DIAGNOSIS — K219 Gastro-esophageal reflux disease without esophagitis: Secondary | ICD-10-CM | POA: Diagnosis present

## 2016-12-12 DIAGNOSIS — Z419 Encounter for procedure for purposes other than remedying health state, unspecified: Secondary | ICD-10-CM

## 2016-12-12 LAB — URINALYSIS, ROUTINE W REFLEX MICROSCOPIC
Bacteria, UA: NONE SEEN
Bilirubin Urine: NEGATIVE
Glucose, UA: NEGATIVE mg/dL
Hgb urine dipstick: NEGATIVE
KETONES UR: 5 mg/dL — AB
Leukocytes, UA: NEGATIVE
Nitrite: NEGATIVE
PH: 5 (ref 5.0–8.0)
PROTEIN: 30 mg/dL — AB
SQUAMOUS EPITHELIAL / LPF: NONE SEEN
Specific Gravity, Urine: 1.02 (ref 1.005–1.030)

## 2016-12-12 LAB — BASIC METABOLIC PANEL
Anion gap: 6 (ref 5–15)
BUN: 22 mg/dL — AB (ref 6–20)
CALCIUM: 8.7 mg/dL — AB (ref 8.9–10.3)
CO2: 28 mmol/L (ref 22–32)
CREATININE: 0.91 mg/dL (ref 0.61–1.24)
Chloride: 105 mmol/L (ref 101–111)
GFR calc Af Amer: >60 mL/min (ref >60)
GLUCOSE: 117 mg/dL — AB (ref 65–99)
Potassium: 4.1 mmol/L (ref 3.5–5.1)
SODIUM: 139 mmol/L (ref 135–145)

## 2016-12-12 LAB — CBC WITH DIFFERENTIAL/PLATELET
BASOS ABS: 0 10*3/uL (ref 0.0–0.1)
BASOS PCT: 0 %
EOS ABS: 0.1 10*3/uL (ref 0.0–0.7)
EOS PCT: 2 %
HCT: 37.4 % — ABNORMAL LOW (ref 39.0–52.0)
Hemoglobin: 12.2 g/dL — ABNORMAL LOW (ref 13.0–17.0)
LYMPHS PCT: 8 %
Lymphs Abs: 0.5 10*3/uL — ABNORMAL LOW (ref 0.7–4.0)
MCH: 28.4 pg (ref 26.0–34.0)
MCHC: 32.6 g/dL (ref 30.0–36.0)
MCV: 87 fL (ref 78.0–100.0)
MONO ABS: 0.5 10*3/uL (ref 0.1–1.0)
Monocytes Relative: 7 %
Neutro Abs: 5.3 10*3/uL (ref 1.7–7.7)
Neutrophils Relative %: 83 %
PLATELETS: 159 10*3/uL (ref 150–400)
RBC: 4.3 MIL/uL (ref 4.22–5.81)
RDW: 13.1 % (ref 11.5–15.5)
WBC: 6.4 10*3/uL (ref 4.0–10.5)

## 2016-12-12 LAB — TYPE AND SCREEN
ABO/RH(D): AB POS
Antibody Screen: NEGATIVE

## 2016-12-12 MED ORDER — FENTANYL CITRATE (PF) 100 MCG/2ML IJ SOLN
50.0000 ug | INTRAMUSCULAR | Status: DC | PRN
  Administered 2016-12-12: 50 ug via INTRAVENOUS
  Filled 2016-12-12: qty 2

## 2016-12-12 MED ORDER — TETANUS-DIPHTH-ACELL PERTUSSIS 5-2.5-18.5 LF-MCG/0.5 IM SUSP
0.5000 mL | Freq: Once | INTRAMUSCULAR | Status: AC
  Administered 2016-12-12: 0.5 mL via INTRAMUSCULAR
  Filled 2016-12-12: qty 0.5

## 2016-12-12 MED ORDER — ONDANSETRON HCL 4 MG/2ML IJ SOLN
4.0000 mg | Freq: Once | INTRAMUSCULAR | Status: AC
  Administered 2016-12-12: 4 mg via INTRAVENOUS
  Filled 2016-12-12: qty 2

## 2016-12-12 MED ORDER — DONEPEZIL HCL 5 MG PO TABS
10.0000 mg | ORAL_TABLET | Freq: Every day | ORAL | Status: DC
  Administered 2016-12-12 – 2016-12-15 (×4): 10 mg via ORAL
  Filled 2016-12-12 (×4): qty 2

## 2016-12-12 MED ORDER — PROSIGHT PO TABS
1.0000 | ORAL_TABLET | Freq: Two times a day (BID) | ORAL | Status: DC
  Administered 2016-12-12 – 2016-12-16 (×8): 1 via ORAL
  Filled 2016-12-12 (×8): qty 1

## 2016-12-12 MED ORDER — HYDROCODONE-ACETAMINOPHEN 5-325 MG PO TABS
1.0000 | ORAL_TABLET | Freq: Four times a day (QID) | ORAL | Status: DC | PRN
  Administered 2016-12-12: 2 via ORAL
  Administered 2016-12-14: 1 via ORAL
  Administered 2016-12-14: 2 via ORAL
  Administered 2016-12-14: 1 via ORAL
  Administered 2016-12-15 (×3): 2 via ORAL
  Administered 2016-12-15: 1 via ORAL
  Administered 2016-12-16 (×2): 2 via ORAL
  Filled 2016-12-12: qty 2
  Filled 2016-12-12: qty 1
  Filled 2016-12-12 (×6): qty 2
  Filled 2016-12-12: qty 1
  Filled 2016-12-12 (×3): qty 2
  Filled 2016-12-12: qty 1

## 2016-12-12 MED ORDER — MORPHINE SULFATE (PF) 2 MG/ML IV SOLN
0.5000 mg | INTRAVENOUS | Status: DC | PRN
  Administered 2016-12-12 – 2016-12-14 (×5): 0.5 mg via INTRAVENOUS
  Filled 2016-12-12 (×5): qty 1

## 2016-12-12 MED ORDER — PANTOPRAZOLE SODIUM 40 MG PO TBEC
40.0000 mg | DELAYED_RELEASE_TABLET | Freq: Every day | ORAL | Status: DC | PRN
Start: 2016-12-12 — End: 2016-12-16
  Filled 2016-12-12: qty 1

## 2016-12-12 MED ORDER — METOPROLOL TARTRATE 25 MG PO TABS
25.0000 mg | ORAL_TABLET | Freq: Two times a day (BID) | ORAL | Status: DC
  Administered 2016-12-12 – 2016-12-16 (×7): 25 mg via ORAL
  Filled 2016-12-12 (×8): qty 1

## 2016-12-12 NOTE — ED Provider Notes (Signed)
[facility] DEPT Provider Note   CSN: [id number] Arrival date & time: 12/12/16  1753     History   Chief Complaint Chief Complaint  Patient presents with  . Fall  . Hip Pain   PCP: Renato Shin, W OrthopodAlvan Dame HPI [name] is a 83 y.o. male who presents with fall and R hip pain. Patient had a mechanical fall while taking out the garbage today. He did not hit his head or lose consiousness. Patient fell onto his R hip and was unable to ambulate. He also had an abrasion to the Left hand. He does not take blood thinners. He has a hx of CAD and recent left axillary node biopsy by Dr. Harlow Asa that is still pending. The patient has previous BL TKAs with Dr. [name] at [facility]. Unsure of his last Tdap.  HPI  Past Medical History:  Diagnosis Date  . Adenomatous colon polyp   . Anemia   . Arthritis   . CAD (coronary artery disease)    s/p Taxus DES to prox and mid LAD 06/2003;  LHC  1/07: LM 205, LAD stents ok., oD2 70% jailed (tx conservatively), mid to dist RCA 40-50%, EF 40-45%;  echo 2/07: EF 55-65%  . COPD (chronic obstructive pulmonary disease) ([place])   . Dermatitis   . Diverticulosis of colon   . GERD (gastroesophageal reflux disease)   . Hemorrhoids   . Hiatal hernia   . History of prostate cancer   . History of renal calculi   . Hyperglycemia   . Hyperlipidemia   . Hypothyroidism   . Migraines   . Osteoarthritis   . Prostate cancer ([place])   . Transfusion history     Patient Active Problem List   Diagnosis Date Noted  . Other fatigue 09/27/2016  . Influenza vaccination declined by patient 09/27/2016  . Anxiety state 10/10/2015  . Abnormal chest x-ray 10/02/2015  . 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 Memory loss 04/13/2015  . Chronically on opiate therapy 12/02/2014  . Visit for preventive health examination 04/30/2014  . Pain management 04/30/2014  . Essential hypertension 09/19/2013  . Medication management 08/19/2012   . Wears hearing aid 02/19/2012  . Macular degeneration 02/19/2012  . Opiate use 02/19/2012  . LESION, FACE 03/17/2010  . SYNCOPE 03/17/2010  . HYPERKERATOSIS 10/06/2009  . OSTEOARTHRITIS 05/22/2009  . HYPOTHYROIDISM 07/02/2008  . ANEMIA 07/02/2008  . FATIGUE 07/02/2008  . TOTAL KNEE REPLACEMENT, RIGHT, HX OF 07/02/2008  . ADENOMATOUS COLONIC POLYP 01/31/2008  . HEMORRHOIDS 01/31/2008  . COPD 01/31/2008  . HIATAL HERNIA 01/31/2008  . DIVERTICULOSIS OF COLON 01/31/2008  . RENAL CALCULUS, HX OF 01/31/2008  . DERMATITIS 11/06/2007  . ARTHRITIS 11/06/2007  . HYPERLIPIDEMIA 06/20/2007  . Coronary atherosclerosis 06/20/2007  . GERD 06/20/2007  . PROSTATE CANCER, HX OF 06/20/2007    Past Surgical History:  Procedure Laterality Date  . APPENDECTOMY    . broken knee cap    . broken wrist    . COLONOSCOPY  2005  . CORONARY STENT PLACEMENT     2004  . left knee replacement    . LITHOTRIPSY  1996  . PROSTATECTOMY    . radiation for prostate    . RETINAL DETACHMENT SURGERY    . right knee replacement     x 2  . TOTAL KNEE ARTHROPLASTY  2001/2002       Home Medications    Prior to Admission medications   Medication Sig Start Date  End Date Taking? Authorizing Provider  amoxicillin (AMOXIL) 500 MG capsule Take 500 mg by mouth daily as needed (dental).  11/08/16  Yes Historical Provider, MD  clobetasol (TEMOVATE) 0.05 % external solution APPLY TO AFFECTED AREA TWICE A DAY ON SCALP AS NEEDED (NOT TO FACE) 12/22/15  Yes Historical Provider, MD  donepezil (ARICEPT) 10 MG tablet Take 1 tablet (10 mg total) by mouth at bedtime. 09/05/16  Yes Cameron Sprang, MD  HYDROcodone-acetaminophen [facility]) 10-325 MG tablet Take half a tablet every 5 hours as needed Patient taking differently: Take 0.5 tablets by mouth every 4 (four) hours as needed for moderate pain or severe pain.  10/18/16  Yes Burnis Medin, MD  metoprolol tartrate (LOPRESSOR) 25 MG tablet TAKE 1 TABLET TWICE A DAY Patient  taking differently: TAKE 25 MG BY MOUTH TWICE A DAY 11/16/15  Yes Lelon Perla, MD  Multiple Vitamins-Minerals (OCUVITE PRESERVISION) TABS Take 1 tablet by mouth 2 (two) times daily.     Yes Historical Provider, MD  omeprazole (PRILOSEC OTC) 20 MG tablet Take 20 mg by mouth daily as needed (reflux).  02/15/12  Yes Burnis Medin, MD  OVER THE COUNTER MEDICATION Take 1 tablet by mouth 2 (two) times daily. Calcium Citrate '500mg'$  w/ Vitamin D '800mg'$    Yes Historical Provider, MD  triamcinolone lotion (KENALOG) 0.1 % APPLY 1 APPLICATION ON THE SKIN DAILY, APPLY TO SCALP TWO TIMES AS NEEDED FOR FLARES 01/19/16  Yes Historical Provider, MD    Family History Family History  Problem Relation Age of Onset  . Hypertension Other   . Arthritis Other   . Cancer Other   . Rheum arthritis Daughter     Social History Social History  Substance Use Topics  . Smoking status: Former Research scientist (life sciences)  . Smokeless tobacco: Never Used     Comment: Stopped in 1986  . Alcohol use No     Allergies   Pravastatin; Aspirin; and Meperidine hcl   Review of Systems Review of Systems  Ten systems reviewed and are negative for acute change, except as noted in the HPI.   Physical Exam Updated Vital Signs BP 173/68 (BP Location: Left Arm)   Pulse (!) 54   Temp 97.8 F (36.6 C) (Oral)   Resp 18   SpO2 97%   Physical Exam  Constitutional: He is oriented to person, place, and time. He appears well-developed and well-nourished. No distress.  HENT:  Head: Normocephalic and atraumatic.  Eyes: Conjunctivae are normal. No scleral icterus.  Neck: Normal range of motion. Neck supple.  Cardiovascular: Normal rate, regular rhythm and normal heart sounds.   Pulmonary/Chest: Effort normal and breath sounds normal. No respiratory distress.  Abdominal: Soft. There is no tenderness.  Musculoskeletal: He exhibits no edema.  RLE sitting about 1 inch higher in external rotation. Normal pulse and sensation. TTP over the proximal  femur.  Neurological: He is alert and oriented to person, place, and time.  Skin: Skin is warm and dry. He is not diaphoretic.  Psychiatric: His behavior is normal.  Nursing note and vitals reviewed.    ED Treatments / Results  Labs (all labs ordered are listed, but only abnormal results are displayed) Labs Reviewed  BASIC METABOLIC PANEL  CBC WITH DIFFERENTIAL/PLATELET  URINALYSIS, ROUTINE W REFLEX MICROSCOPIC  TYPE AND SCREEN    EKG  EKG Interpretation None       Radiology No results found.  Procedures Procedures (including critical care time)  Medications Ordered in ED Medications  fentaNYL (SUBLIMAZE) injection 50 mcg (not administered)  ondansetron (ZOFRAN) injection 4 mg (not administered)  Tdap (BOOSTRIX) injection 0.5 mL (not administered)     Initial Impression / Assessment and Plan / ED Course  I have reviewed the triage vital signs and the nursing notes.  Pertinent labs & imaging results that were available during my care of the patient were reviewed by me and considered in my medical decision making (see chart for details).  Clinical Course    Patient with clinical hip frx. Awaiting films.   Final Clinical Impressions(s) / ED Diagnoses   Final diagnoses:  Closed fracture of right hip, initial encounter [facility])   Patient with R hip frx.  Admit to hospitalist.  [facility] to perform repair tomorrow. NPO after midnight.  New Prescriptions New Prescriptions   No medications on file     Margarita Mail, PA-C 12/14/16 [address], MD 12/14/16 1249

## 2016-12-12 NOTE — Consult Note (Signed)
Reason for Consult:right hip pain Referring Physician: EDP  HPI: [name] is an 83 y.o. male known to [employer], pervious patient of Dr. [name] s/p B TKA's, fell at home this evening taking out garbage with immediate c/o right hip pain and inability to bear weight. Reports recent left axillary lymph node biopsy by Dr. Harlow Asa, path pending.  Past Medical History:  Diagnosis Date  . Adenomatous colon polyp   . Anemia   . Arthritis   . CAD (coronary artery disease)    s/p Taxus DES to prox and mid LAD 06/2003;  LHC  1/07: LM 205, LAD stents ok., oD2 70% jailed (tx conservatively), mid to dist RCA 40-50%, EF 40-45%;  echo 2/07: EF 55-65%  . COPD (chronic obstructive pulmonary disease) ([place])   . Dermatitis   . Diverticulosis of colon   . GERD (gastroesophageal reflux disease)   . Hemorrhoids   . Hiatal hernia   . History of prostate cancer   . History of renal calculi   . Hyperglycemia   . Hyperlipidemia   . Hypothyroidism   . Migraines   . Osteoarthritis   . Prostate cancer ([place])   . Transfusion history     Past Surgical History:  Procedure Laterality Date  . APPENDECTOMY    . broken knee cap    . broken wrist    . COLONOSCOPY  2005  . CORONARY STENT PLACEMENT     2004  . left knee replacement    . LITHOTRIPSY  1996  . PROSTATECTOMY    . radiation for prostate    . RETINAL DETACHMENT SURGERY    . right knee replacement     x 2  . TOTAL KNEE ARTHROPLASTY  2001/2002    Family History  Problem Relation Age of Onset  . Hypertension Other   . Arthritis Other   . Cancer Other   . Rheum arthritis Daughter     Social History:  reports that he has quit smoking. He has never used smokeless tobacco. He reports that he does not drink alcohol or use drugs.  Allergies:  Allergies  Allergen Reactions  . Pravastatin     ? Memory deficits improved when went off med  . Aspirin     Daily use causes bleeding in the bladder.  Can take occasionally  . Meperidine  Hcl     REACTION: combative    Medications: I have reviewed the patient's current medications.  Results for orders placed or performed during the hospital encounter of 12/12/16 (from the past 48 hour(s))  Urinalysis, Routine w reflex microscopic     Status: Abnormal   Collection Time: 12/12/16  6:30 PM  Result Value Ref Range   Color, Urine YELLOW YELLOW   APPearance CLEAR CLEAR   Specific Gravity, Urine 1.020 1.005 - 1.030   pH 5.0 5.0 - 8.0   Glucose, UA NEGATIVE NEGATIVE mg/dL   Hgb urine dipstick NEGATIVE NEGATIVE   Bilirubin Urine NEGATIVE NEGATIVE   Ketones, ur 5 (A) NEGATIVE mg/dL   Protein, ur 30 (A) NEGATIVE mg/dL   Nitrite NEGATIVE NEGATIVE   Leukocytes, UA NEGATIVE NEGATIVE   RBC / HPF 0-5 0 - 5 RBC/hpf   WBC, UA 0-5 0 - 5 WBC/hpf   Bacteria, UA NONE SEEN NONE SEEN   Squamous Epithelial / LPF NONE SEEN NONE SEEN   Mucous PRESENT   Basic metabolic panel     Status: Abnormal   Collection Time: 12/12/16  7:13 PM  Result  Value Ref Range   Sodium 139 135 - 145 mmol/L   Potassium 4.1 3.5 - 5.1 mmol/L   Chloride 105 101 - 111 mmol/L   CO2 28 22 - 32 mmol/L   Glucose, Bld 117 (H) 65 - 99 mg/dL   BUN 22 (H) 6 - 20 mg/dL   Creatinine, Ser 0.91 0.61 - 1.24 mg/dL   Calcium 8.7 (L) 8.9 - 10.3 mg/dL   GFR calc non Af Amer >60 >60 mL/min   GFR calc Af Amer >60 >60 mL/min    Comment: (NOTE) The eGFR has been calculated using the CKD EPI equation. This calculation has not been validated in all clinical situations. eGFR's persistently <60 mL/min signify possible Chronic Kidney Disease.    Anion gap 6 5 - 15  CBC WITH DIFFERENTIAL     Status: Abnormal   Collection Time: 12/12/16  7:13 PM  Result Value Ref Range   WBC 6.4 4.0 - 10.5 K/uL   RBC 4.30 4.22 - 5.81 MIL/uL   Hemoglobin 12.2 (L) 13.0 - 17.0 g/dL   HCT 37.4 (L) 39.0 - 52.0 %   MCV 87.0 78.0 - 100.0 fL   MCH 28.4 26.0 - 34.0 pg   MCHC 32.6 30.0 - 36.0 g/dL   RDW 13.1 11.5 - 15.5 %   Platelets 159 150 - 400 K/uL    Neutrophils Relative % 83 %   Neutro Abs 5.3 1.7 - 7.7 K/uL   Lymphocytes Relative 8 %   Lymphs Abs 0.5 (L) 0.7 - 4.0 K/uL   Monocytes Relative 7 %   Monocytes Absolute 0.5 0.1 - 1.0 K/uL   Eosinophils Relative 2 %   Eosinophils Absolute 0.1 0.0 - 0.7 K/uL   Basophils Relative 0 %   Basophils Absolute 0.0 0.0 - 0.1 K/uL  Type and screen Luck     Status: None (Preliminary result)   Collection Time: 12/12/16  8:14 PM  Result Value Ref Range   ABO/RH(D) AB POS    Antibody Screen PENDING    Sample Expiration 12/15/2016     Dg Chest 1 View  Result Date: 12/12/2016 CLINICAL DATA:  Fall, fractured hip EXAM: CHEST 1 VIEW COMPARISON:  10/02/2015 FINDINGS: Stable calcified right mid lung zone nodule. Coarse interstitial opacities, likely chronic. No acute consolidation or effusion. Stable cardiomediastinal silhouette with tortuous calcified aorta. Coronary calcification or possible stent. No pneumothorax. IMPRESSION: 1. Probable coarse interstitial opacities at the bilateral lung bases. No acute infiltrate or edema 2. Atherosclerosis of the aorta Electronically Signed   By: Donavan Foil M.D.   On: 12/12/2016 20:22   Dg Hip Unilat  With Pelvis 2-3 Views Right  Result Date: 12/12/2016 CLINICAL DATA:  Fall taking out the garbage EXAM: DG HIP (WITH OR WITHOUT PELVIS) 2-3V RIGHT COMPARISON:  None. FINDINGS: Surgical clips within the pelvis. SI joints grossly symmetric. Left femoral head projects in joint. Comminuted intertrochanteric fracture of the right femur with superior migration of the shaft of the femur. Right femoral head projects in joint. Suspect old right superior and inferior pubic rami fractures. IMPRESSION: 1. Comminuted intertrochanteric fracture of the proximal right femur. Right femoral head projects in joint 2. Suspect old fractures of the right superior inferior pubic rami Electronically Signed   By: Donavan Foil M.D.   On: 12/12/2016 20:20      Vitals Temp:  [97.8 F (36.6 C)] 97.8 F (36.6 C) (01/08 1805) Pulse Rate:  [54-65] 57 (01/08 2022) Resp:  [14-19] 17 (01/08  2022) BP: (144-173)/(68) 144/68 (01/08 2022) SpO2:  [93 %-97 %] 95 % (01/08 2022) There is no height or weight on file to calculate BMI.  Physical Exam: elderly WM, cooperative w exam. [place]/AT, no clavicular crepitance, no gross bone or joint instability UE's, LLE stable and non tender. Right hip diffusely tender and pain with attempted motion, N/V intact distally BLE.     Assessment/Plan: Impression: displaced right intertrochanteric hip fx. Treatment: I have discussed with patient and family members at the bedside treatment options and risks vs benefits thereof. Tentatively plan on surgery tomorrow and will spaek with Dr. [name] regarding definitive surgical management. NPO after midnight.  Amand Lemoine M Jett Fukuda 12/12/2016, 9:26 PM  Contact # [phone]

## 2016-12-12 NOTE — ED Triage Notes (Signed)
Pt comes from home via EMS with complaints of a fall while taking out the garbage today.  [place] trying to catch the trash can that was starting to roll over.  Denies LOC or hitting head.  Denies use of blood thinners.  Complaining of right hip pain.  Rotation and shortening noted by EMS. Given 100 mcg of fentanyl and 4 mg of zofran in route.  8/10 pain level noted on EMS arrival.  Pain 1/10 after administration of fentanyl. BP 155/79, HR 60, RR 18, O2 98% RA. NSR.

## 2016-12-12 NOTE — ED Notes (Signed)
RN will draw blood work from IV line

## 2016-12-12 NOTE — Care Management Note (Signed)
Case Management Note  Patient Details  Name: [name] MRN: [id number] Date of Birth: [birth year]  Subjective/Objective:                  Fall, right hip fracture  Action/Plan: CM spoke with the patient, his wife and daughters at the bedside. Patient fell while taking out the garbage at home. He lives in a home with his wife. She drives and is able to take him to appointments. Reports no issues with medication compliance. Care Management will continue to follow for discharge needs.   Expected Discharge Date:   (unknown)               Expected Discharge Plan:  [place]  In-House Referral:     Discharge planning Services  CM Consult  Post Acute Care Choice:    Choice offered to:     DME Arranged:    DME Agency:     HH Arranged:    HH Agency:     Status of Service:  In process, will continue to follow  If discussed at Long Length of Stay Meetings, dates discussed:    Additional Comments:  Apolonio Schneiders, RN 12/12/2016, 9:03 PM

## 2016-12-12 NOTE — ED Notes (Signed)
Bed: [facility] Expected date:  Expected time:  Means of arrival:  Comments: EMS-fall-hip deformity

## 2016-12-12 NOTE — H&P (Signed)
History and Physical    LOC FEINSTEIN WSF:[id number] DOB: Jan 05, 1930 DOA: 12/12/2016  PCP: Lottie Dawson, MD  Patient coming from: Home.  Chief Complaint: Fall.  HPI: [name] is a 83 y.o. male with history of CAD status post stenting presents to the ER because of fall with right hip pain. X-rays revealed a right hip fracture. Denies any loss of consciousness or hitting his head on fall. Patient states he was walking with trash can when he suddenly tripped and fell. On call orthopedic surgeon Dr. Onnie Graham has been consulted. Patient will be admitted for right hip surgery.   Patient has had left axillary node biopsy done last week.  ED Course: X-ray of the hip shows a right hip fracture. Chest x-ray shows coarse interstitial pattern. EKG shows normal sinus rhythm with early transition. And LVH. CBC shows normocytic normochromic anemia.  Review of Systems: As per HPI, rest all negative.   Past Medical History:  Diagnosis Date  . Adenomatous colon polyp   . Anemia   . Arthritis   . CAD (coronary artery disease)    s/p Taxus DES to prox and mid LAD 06/2003;  LHC  1/07: LM 205, LAD stents ok., oD2 70% jailed (tx conservatively), mid to dist RCA 40-50%, EF 40-45%;  echo 2/07: EF 55-65%  . COPD (chronic obstructive pulmonary disease) ([place])   . Dermatitis   . Diverticulosis of colon   . GERD (gastroesophageal reflux disease)   . Hemorrhoids   . Hiatal hernia   . History of prostate cancer   . History of renal calculi   . Hyperglycemia   . Hyperlipidemia   . Hypothyroidism   . Migraines   . Osteoarthritis   . Prostate cancer ([place])   . Transfusion history     Past Surgical History:  Procedure Laterality Date  . APPENDECTOMY    . broken knee cap    . broken wrist    . COLONOSCOPY  2005  . CORONARY STENT PLACEMENT     2004  . left knee replacement    . LITHOTRIPSY  1996  . PROSTATECTOMY    . radiation for prostate    . RETINAL DETACHMENT SURGERY    . right knee  replacement     x 2  . TOTAL KNEE ARTHROPLASTY  2001/2002     reports that he has quit smoking. He has never used smokeless tobacco. He reports that he does not drink alcohol or use drugs.  Allergies  Allergen Reactions  . Pravastatin     ? Memory deficits improved when went off med  . Aspirin     Daily use causes bleeding in the bladder.  Can take occasionally  . Meperidine Hcl     REACTION: combative    Family History  Problem Relation Age of Onset  . Hypertension Other   . Arthritis Other   . Cancer Other   . Rheum arthritis Daughter     Prior to Admission medications   Medication Sig Start Date End Date Taking? Authorizing Provider  amoxicillin (AMOXIL) 500 MG capsule Take 500 mg by mouth daily as needed (dental).  11/08/16  Yes Historical Provider, MD  clobetasol (TEMOVATE) 0.05 % external solution APPLY TO AFFECTED AREA TWICE A DAY ON SCALP AS NEEDED (NOT TO FACE) 12/22/15  Yes Historical Provider, MD  donepezil (ARICEPT) 10 MG tablet Take 1 tablet (10 mg total) by mouth at bedtime. 09/05/16  Yes Cameron Sprang, MD  HYDROcodone-acetaminophen [facility]) [phone]  MG tablet Take half a tablet every 5 hours as needed Patient taking differently: Take 0.5 tablets by mouth every 4 (four) hours as needed for moderate pain or severe pain.  10/18/16  Yes Burnis Medin, MD  metoprolol tartrate (LOPRESSOR) 25 MG tablet TAKE 1 TABLET TWICE A DAY Patient taking differently: TAKE 25 MG BY MOUTH TWICE A DAY 11/16/15  Yes Lelon Perla, MD  Multiple Vitamins-Minerals (OCUVITE PRESERVISION) TABS Take 1 tablet by mouth 2 (two) times daily.     Yes Historical Provider, MD  omeprazole (PRILOSEC OTC) 20 MG tablet Take 20 mg by mouth daily as needed (reflux).  02/15/12  Yes Burnis Medin, MD  OVER THE COUNTER MEDICATION Take 1 tablet by mouth 2 (two) times daily. Calcium Citrate '500mg'$  w/ Vitamin D '800mg'$    Yes Historical Provider, MD  triamcinolone lotion (KENALOG) 0.1 % APPLY 1 APPLICATION ON THE  SKIN DAILY, APPLY TO SCALP TWO TIMES AS NEEDED FOR FLARES 01/19/16  Yes Historical Provider, MD    Physical Exam: Vitals:   12/12/16 1945 12/12/16 2022 12/12/16 2030 12/12/16 2149  BP:  144/68 136/84 (!) 144/105  Pulse: (!) 58 (!) 57 (!) 50 65  Resp: '18 17 20 20  '$ Temp:      TempSrc:      SpO2: 93% 95% 97% 97%      Constitutional: Moderately built and nourished. Vitals:   12/12/16 1945 12/12/16 2022 12/12/16 2030 12/12/16 2149  BP:  144/68 136/84 (!) 144/105  Pulse: (!) 58 (!) 57 (!) 50 65  Resp: '18 17 20 20  '$ Temp:      TempSrc:      SpO2: 93% 95% 97% 97%   Eyes: Anicteric no pallor. ENMT: No discharge from the ears eyes nose or mouth. Neck: No mass felt. No neck rigidity. Respiratory: No rhonchi or crepitations. Cardiovascular: S1-S2 heard. No murmurs appreciated. Abdomen: Soft nontender bowel sounds present. Musculoskeletal: Pain on moving her right hip. Skin: No rash. Neurologic: Alert awake oriented times place and person. Moves all extremities. Psychiatric: Appears normal. Normal affect.   Labs on Admission: I have personally reviewed following labs and imaging studies  CBC:  Recent Labs Lab 12/12/16 1913  WBC 6.4  NEUTROABS 5.3  HGB 12.2*  HCT 37.4*  MCV 87.0  PLT 132   Basic Metabolic Panel:  Recent Labs Lab 12/12/16 1913  NA 139  K 4.1  CL 105  CO2 28  GLUCOSE 117*  BUN 22*  CREATININE 0.91  CALCIUM 8.7*   GFR: CrCl cannot be calculated (Unknown ideal weight.). Liver Function Tests: No results for input(s): AST, ALT, ALKPHOS, BILITOT, PROT, ALBUMIN in the last 168 hours. No results for input(s): LIPASE, AMYLASE in the last 168 hours. No results for input(s): AMMONIA in the last 168 hours. Coagulation Profile: No results for input(s): INR, PROTIME in the last 168 hours. Cardiac Enzymes: No results for input(s): CKTOTAL, CKMB, CKMBINDEX, TROPONINI in the last 168 hours. BNP (last 3 results) No results for input(s): PROBNP in the last  8760 hours. HbA1C: No results for input(s): HGBA1C in the last 72 hours. CBG: No results for input(s): GLUCAP in the last 168 hours. Lipid Profile: No results for input(s): CHOL, HDL, LDLCALC, TRIG, CHOLHDL, LDLDIRECT in the last 72 hours. Thyroid Function Tests: No results for input(s): TSH, T4TOTAL, FREET4, T3FREE, THYROIDAB in the last 72 hours. Anemia Panel: No results for input(s): VITAMINB12, FOLATE, FERRITIN, TIBC, IRON, RETICCTPCT in the last 72 hours. Urine analysis:  Component Value Date/Time   COLORURINE YELLOW 12/12/2016 1830   APPEARANCEUR CLEAR 12/12/2016 1830   LABSPEC 1.020 12/12/2016 1830   PHURINE 5.0 12/12/2016 1830   GLUCOSEU NEGATIVE 12/12/2016 1830   HGBUR NEGATIVE 12/12/2016 1830   BILIRUBINUR NEGATIVE 12/12/2016 1830   BILIRUBINUR neg 11/09/2016 1544   KETONESUR 5 (A) 12/12/2016 1830   PROTEINUR 30 (A) 12/12/2016 1830   UROBILINOGEN 0.2 11/09/2016 1544   UROBILINOGEN 0.2 10/28/2013 1409   NITRITE NEGATIVE 12/12/2016 1830   LEUKOCYTESUR NEGATIVE 12/12/2016 1830   Sepsis Labs: '@LABRCNTIP'$ (procalcitonin:4,lacticidven:4) )No results found for this or any previous visit (from the past 240 hour(s)).   Radiological Exams on Admission: Dg Chest 1 View  Result Date: 12/12/2016 CLINICAL DATA:  Fall, fractured hip EXAM: CHEST 1 VIEW COMPARISON:  10/02/2015 FINDINGS: Stable calcified right mid lung zone nodule. Coarse interstitial opacities, likely chronic. No acute consolidation or effusion. Stable cardiomediastinal silhouette with tortuous calcified aorta. Coronary calcification or possible stent. No pneumothorax. IMPRESSION: 1. Probable coarse interstitial opacities at the bilateral lung bases. No acute infiltrate or edema 2. Atherosclerosis of the aorta Electronically Signed   By: Donavan Foil M.D.   On: 12/12/2016 20:22   Dg Hip Unilat  With Pelvis 2-3 Views Right  Result Date: 12/12/2016 CLINICAL DATA:  Fall taking out the garbage EXAM: DG HIP (WITH OR  WITHOUT PELVIS) 2-3V RIGHT COMPARISON:  None. FINDINGS: Surgical clips within the pelvis. SI joints grossly symmetric. Left femoral head projects in joint. Comminuted intertrochanteric fracture of the right femur with superior migration of the shaft of the femur. Right femoral head projects in joint. Suspect old right superior and inferior pubic rami fractures. IMPRESSION: 1. Comminuted intertrochanteric fracture of the proximal right femur. Right femoral head projects in joint 2. Suspect old fractures of the right superior inferior pubic rami Electronically Signed   By: Donavan Foil M.D.   On: 12/12/2016 20:20    EKG: Independently reviewed. Normal sinus rhythm with early transition. LVH.  Assessment/Plan Principal Problem:   Closed right hip fracture (HCC) Active Problems:   Coronary atherosclerosis   PROSTATE CANCER, HX OF   Essential hypertension   Amnestic MCI (mild cognitive impairment with memory loss)    1. Right hip fracture status post mechanical fall - patient has had last echocardiogram done in 2013 which showed EF of 60%. Stress test done during the same time was unremarkable. At this time patient denies any chest pain or shortness of breath. From medical standpoint a few patient is at moderate risk for intermediate risk procedure. Patient will be kept nothing by mouth past midnight in anticipation of hip surgery. I have discussed with Dr. Onnie Graham, orthopedic surgeon. Patient probably also has chronic right pubic fracture. 2. History of CAD status post stenting - patient does not take antiplatelet agents due to bleeding. On metoprolol which will be continued. 3. Hypertension on metoprolol. 4. Cognitive impairment on an Aricept. 5. Anemia - follow CBC. 6. Left axillary lymph node biopsy done last week - follow reports.   DVT prophylaxis: SCDs. Code Status: Full code.  Family Communication: Patient's wife and daughter.  Disposition Plan: Home.  Consults called: Orthopedic  surgery.  Admission status: Inpatient.    Rise Patience MD [facility] Pager [phone].  If 7PM-7AM, please contact night-coverage [email or web address] Password [email or web address]  12/12/2016, 10:19 PM

## 2016-12-13 ENCOUNTER — Inpatient Hospital Stay (HOSPITAL_COMMUNITY): Payer: Medicare Other | Admitting: Certified Registered Nurse Anesthetist

## 2016-12-13 ENCOUNTER — Encounter (HOSPITAL_COMMUNITY)
Admission: EM | Disposition: A | Discharge status: Skilled Nursing Facility | Payer: Self-pay | Source: Home / Self Care | Attending: Internal Medicine

## 2016-12-13 ENCOUNTER — Telehealth: Payer: Self-pay | Admitting: Internal Medicine

## 2016-12-13 ENCOUNTER — Inpatient Hospital Stay (HOSPITAL_COMMUNITY): Payer: Medicare Other

## 2016-12-13 ENCOUNTER — Encounter (HOSPITAL_COMMUNITY): Payer: Self-pay | Admitting: Registered Nurse

## 2016-12-13 DIAGNOSIS — C799 Secondary malignant neoplasm of unspecified site: Secondary | ICD-10-CM

## 2016-12-13 DIAGNOSIS — IMO0002 Reserved for concepts with insufficient information to code with codable children: Secondary | ICD-10-CM

## 2016-12-13 HISTORY — PX: FEMUR IM NAIL: SHX1597

## 2016-12-13 LAB — URINALYSIS, ROUTINE W REFLEX MICROSCOPIC
Bacteria, UA: NONE SEEN
Bilirubin Urine: NEGATIVE
Glucose, UA: NEGATIVE mg/dL
Hgb urine dipstick: NEGATIVE
Ketones, ur: 20 mg/dL — AB
LEUKOCYTES UA: NEGATIVE
Nitrite: NEGATIVE
PROTEIN: NEGATIVE mg/dL
SPECIFIC GRAVITY, URINE: 1.023 (ref 1.005–1.030)
SQUAMOUS EPITHELIAL / LPF: NONE SEEN
pH: 5 (ref 5.0–8.0)

## 2016-12-13 LAB — CBC
HEMATOCRIT: 34.2 % — AB (ref 39.0–52.0)
HEMOGLOBIN: 11.2 g/dL — AB (ref 13.0–17.0)
MCH: 28.9 pg (ref 26.0–34.0)
MCHC: 32.7 g/dL (ref 30.0–36.0)
MCV: 88.4 fL (ref 78.0–100.0)
Platelets: 156 10*3/uL (ref 150–400)
RBC: 3.87 MIL/uL — AB (ref 4.22–5.81)
RDW: 13.3 % (ref 11.5–15.5)
WBC: 8.5 10*3/uL (ref 4.0–10.5)

## 2016-12-13 LAB — BASIC METABOLIC PANEL
ANION GAP: 6 (ref 5–15)
BUN: 21 mg/dL — ABNORMAL HIGH (ref 6–20)
CO2: 28 mmol/L (ref 22–32)
Calcium: 8.6 mg/dL — ABNORMAL LOW (ref 8.9–10.3)
Chloride: 106 mmol/L (ref 101–111)
Creatinine, Ser: 0.67 mg/dL (ref 0.61–1.24)
GFR calc non Af Amer: >60 mL/min (ref >60)
GLUCOSE: 134 mg/dL — AB (ref 65–99)
POTASSIUM: 4 mmol/L (ref 3.5–5.1)
Sodium: 140 mmol/L (ref 135–145)

## 2016-12-13 LAB — SURGICAL PCR SCREEN
MRSA, PCR: NEGATIVE
STAPHYLOCOCCUS AUREUS: NEGATIVE

## 2016-12-13 SURGERY — INSERTION, INTRAMEDULLARY ROD, FEMUR
Anesthesia: Monitor Anesthesia Care | Laterality: Right

## 2016-12-13 MED ORDER — METOCLOPRAMIDE HCL 5 MG PO TABS: 5.0000 mg | ORAL_TABLET | Freq: Three times a day (TID) | ORAL | Status: DC | PRN

## 2016-12-13 MED ORDER — PROPOFOL 500 MG/50ML IV EMUL
INTRAVENOUS | Status: DC | PRN
  Administered 2016-12-13: 50 ug/kg/min via INTRAVENOUS

## 2016-12-13 MED ORDER — STERILE WATER FOR IRRIGATION IR SOLN
Status: DC | PRN
  Administered 2016-12-13: 1000 mL

## 2016-12-13 MED ORDER — ENOXAPARIN SODIUM 40 MG/0.4ML ~~LOC~~ SOLN
40.0000 mg | SUBCUTANEOUS | Status: DC
  Administered 2016-12-14 – 2016-12-16 (×3): 40 mg via SUBCUTANEOUS
  Filled 2016-12-13 (×3): qty 0.4

## 2016-12-13 MED ORDER — CEFAZOLIN SODIUM-DEXTROSE 2-4 GM/100ML-% IV SOLN
2.0000 g | Freq: Four times a day (QID) | INTRAVENOUS | Status: AC
  Administered 2016-12-13 – 2016-12-14 (×2): 2 g via INTRAVENOUS
  Filled 2016-12-13 (×2): qty 100

## 2016-12-13 MED ORDER — CHLORHEXIDINE GLUCONATE 4 % EX LIQD
60.0000 mL | Freq: Once | CUTANEOUS | Status: AC
  Administered 2016-12-13: 4 via TOPICAL
  Filled 2016-12-13: qty 60

## 2016-12-13 MED ORDER — POVIDONE-IODINE 10 % EX SWAB
2.0000 | Freq: Once | CUTANEOUS | Status: AC
Start: 2016-12-13 — End: 2016-12-13
  Administered 2016-12-13: 2 via TOPICAL

## 2016-12-13 MED ORDER — FENTANYL CITRATE (PF) 100 MCG/2ML IJ SOLN
INTRAMUSCULAR | Status: AC
  Filled 2016-12-13: qty 2

## 2016-12-13 MED ORDER — KETAMINE HCL 10 MG/ML IJ SOLN
INTRAMUSCULAR | Status: DC | PRN
  Administered 2016-12-13: 50 mg via INTRAVENOUS

## 2016-12-13 MED ORDER — ONDANSETRON HCL 4 MG/2ML IJ SOLN
4.0000 mg | Freq: Four times a day (QID) | INTRAMUSCULAR | Status: DC | PRN
  Administered 2016-12-13 (×2): 4 mg via INTRAVENOUS
  Filled 2016-12-13 (×2): qty 2

## 2016-12-13 MED ORDER — FENTANYL CITRATE (PF) 100 MCG/2ML IJ SOLN
INTRAMUSCULAR | Status: DC | PRN
  Administered 2016-12-13: 50 ug via INTRAVENOUS

## 2016-12-13 MED ORDER — FENTANYL CITRATE (PF) 100 MCG/2ML IJ SOLN
50.0000 ug | INTRAMUSCULAR | Status: DC | PRN
  Administered 2016-12-13: 50 ug via INTRAVENOUS

## 2016-12-13 MED ORDER — 0.9 % SODIUM CHLORIDE (POUR BTL) OPTIME
TOPICAL | Status: DC | PRN
  Administered 2016-12-13: 1000 mL

## 2016-12-13 MED ORDER — METHOCARBAMOL 1000 MG/10ML IJ SOLN
500.0000 mg | Freq: Four times a day (QID) | INTRAVENOUS | Status: DC | PRN
  Administered 2016-12-13: 500 mg via INTRAVENOUS
  Filled 2016-12-13: qty 550
  Filled 2016-12-13: qty 5

## 2016-12-13 MED ORDER — EPHEDRINE 5 MG/ML INJ
INTRAVENOUS | Status: AC
  Filled 2016-12-13: qty 10

## 2016-12-13 MED ORDER — LACTATED RINGERS IV SOLN
INTRAVENOUS | Status: DC
  Administered 2016-12-13 (×2): via INTRAVENOUS

## 2016-12-13 MED ORDER — PROPOFOL 10 MG/ML IV BOLUS
INTRAVENOUS | Status: DC | PRN
  Administered 2016-12-13: 20 mg via INTRAVENOUS

## 2016-12-13 MED ORDER — METOCLOPRAMIDE HCL 5 MG/ML IJ SOLN: 5.0000 mg | Freq: Three times a day (TID) | INTRAMUSCULAR | Status: DC | PRN

## 2016-12-13 MED ORDER — PROPOFOL 10 MG/ML IV BOLUS
INTRAVENOUS | Status: AC
  Filled 2016-12-13: qty 60

## 2016-12-13 MED ORDER — FENTANYL CITRATE (PF) 100 MCG/2ML IJ SOLN: 25.0000 ug | INTRAMUSCULAR | Status: DC | PRN

## 2016-12-13 MED ORDER — CEFAZOLIN SODIUM-DEXTROSE 2-4 GM/100ML-% IV SOLN
INTRAVENOUS | Status: AC
  Filled 2016-12-13: qty 100

## 2016-12-13 MED ORDER — ACETAMINOPHEN 10 MG/ML IV SOLN
INTRAVENOUS | Status: AC
  Filled 2016-12-13: qty 100

## 2016-12-13 MED ORDER — METHOCARBAMOL 500 MG PO TABS
500.0000 mg | ORAL_TABLET | Freq: Four times a day (QID) | ORAL | Status: DC | PRN
  Administered 2016-12-15: 500 mg via ORAL
  Filled 2016-12-13: qty 1

## 2016-12-13 MED ORDER — FERROUS SULFATE 325 (65 FE) MG PO TABS
325.0000 mg | ORAL_TABLET | Freq: Three times a day (TID) | ORAL | Status: DC
  Administered 2016-12-14 – 2016-12-16 (×7): 325 mg via ORAL
  Filled 2016-12-13 (×7): qty 1

## 2016-12-13 MED ORDER — MENTHOL 3 MG MT LOZG
1.0000 | LOZENGE | OROMUCOSAL | Status: DC | PRN
  Filled 2016-12-13: qty 9

## 2016-12-13 MED ORDER — ACETAMINOPHEN 10 MG/ML IV SOLN
INTRAVENOUS | Status: DC | PRN
  Administered 2016-12-13: 1000 mg via INTRAVENOUS

## 2016-12-13 MED ORDER — SODIUM CHLORIDE 0.9 % IV SOLN
INTRAVENOUS | Status: DC
  Administered 2016-12-13: 12:00:00 via INTRAVENOUS

## 2016-12-13 MED ORDER — KETAMINE HCL 10 MG/ML IJ SOLN
INTRAMUSCULAR | Status: AC
  Filled 2016-12-13: qty 1

## 2016-12-13 MED ORDER — ONDANSETRON HCL 4 MG/2ML IJ SOLN: 4.0000 mg | Freq: Once | INTRAMUSCULAR | Status: DC | PRN

## 2016-12-13 MED ORDER — EPHEDRINE SULFATE-NACL 50-0.9 MG/10ML-% IV SOSY
PREFILLED_SYRINGE | INTRAVENOUS | Status: DC | PRN
  Administered 2016-12-13 (×3): 10 mg via INTRAVENOUS

## 2016-12-13 MED ORDER — CEFAZOLIN SODIUM-DEXTROSE 2-4 GM/100ML-% IV SOLN
2.0000 g | INTRAVENOUS | Status: AC
  Administered 2016-12-13: 2 g via INTRAVENOUS

## 2016-12-13 MED ORDER — PHENOL 1.4 % MT LIQD
1.0000 | OROMUCOSAL | Status: DC | PRN
  Filled 2016-12-13: qty 177

## 2016-12-13 SURGICAL SUPPLY — 34 items
BAG SPEC THK2 15X12 ZIP CLS (MISCELLANEOUS) ×1
BAG ZIPLOCK 12X15 (MISCELLANEOUS) ×2 IMPLANT
BIT DRILL CANN LG 4.3MM (BIT) IMPLANT
BNDG GAUZE ELAST 4 BULKY (GAUZE/BANDAGES/DRESSINGS) ×3 IMPLANT
COVER PERINEAL POST (MISCELLANEOUS) ×3 IMPLANT
DRAPE STERI IOBAN 125X83 (DRAPES) ×3 IMPLANT
DRILL BIT CANN LG 4.3MM (BIT) ×3
DRSG AQUACEL AG ADV 3.5X 4 (GAUZE/BANDAGES/DRESSINGS) IMPLANT
DRSG AQUACEL AG ADV 3.5X 6 (GAUZE/BANDAGES/DRESSINGS) IMPLANT
DRSG MEPILEX BORDER 4X12 (GAUZE/BANDAGES/DRESSINGS) ×2 IMPLANT
DURAPREP 26ML APPLICATOR (WOUND CARE) ×3 IMPLANT
ELECT REM PT RETURN 9FT ADLT (ELECTROSURGICAL) ×3
ELECTRODE REM PT RTRN 9FT ADLT (ELECTROSURGICAL) ×1 IMPLANT
GAUZE SPONGE 4X4 12PLY STRL (GAUZE/BANDAGES/DRESSINGS) IMPLANT
GLOVE BIOGEL PI IND STRL 7.5 (GLOVE) ×1 IMPLANT
GLOVE BIOGEL PI INDICATOR 7.5 (GLOVE) ×6
GLOVE ORTHO TXT STRL SZ7.5 (GLOVE) ×3 IMPLANT
GLOVE SURG SS PI 7.5 STRL IVOR (GLOVE) ×2 IMPLANT
GOWN SPEC L3 XXLG W/TWL (GOWN DISPOSABLE) ×2 IMPLANT
GOWN STRL REUS W/TWL LRG LVL3 (GOWN DISPOSABLE) ×3 IMPLANT
GUIDEPIN 3.2X17.5 THRD DISP (PIN) ×2 IMPLANT
HIP FR NAIL LAG SCREW 10.5X110 (Orthopedic Implant) ×3 IMPLANT
KIT BASIN OR (CUSTOM PROCEDURE TRAY) ×3 IMPLANT
MANIFOLD NEPTUNE II (INSTRUMENTS) ×3 IMPLANT
NAIL HIP FRACT 130D 11X180 (Screw) ×2 IMPLANT
PACK GENERAL/GYN (CUSTOM PROCEDURE TRAY) ×3 IMPLANT
POSITIONER SURGICAL ARM (MISCELLANEOUS) ×3 IMPLANT
SCREW BONE CORTICAL 5.0X38 (Screw) ×2 IMPLANT
SCREW LAG HIP FR NAIL 10.5X110 (Orthopedic Implant) IMPLANT
SCREWDRIVER HEX TIP 3.5MM (MISCELLANEOUS) ×2 IMPLANT
SUT VIC AB 1 CT1 27 (SUTURE) ×3
SUT VIC AB 1 CT1 27XBRD ANTBC (SUTURE) ×1 IMPLANT
SUT VIC AB 2-0 CT1 27 (SUTURE) ×6
SUT VIC AB 2-0 CT1 27XBRD (SUTURE) ×2 IMPLANT

## 2016-12-13 NOTE — Telephone Encounter (Signed)
Tried to reach Dr. Harlow Asa to speak with Dr. Regis Bill.  Dr. Harlow Asa out of the office until 1:40 for lunch.  Will call back.

## 2016-12-13 NOTE — Progress Notes (Signed)
Patient ID: [name], male   DOB: 1930-11-16, 82 y.o.   MRN: [id number]  Seen and evaluated after admission and initial consult from [place] Patient well known to me for bilateral TKRs  Fell while taking out trash Sustained right intertrochanteric proximal femur fracture  Leg slightly shortened and externally rotated NVI  Plan: To OR today for ORIF of right hip NPO Pre-op orders placed Reviewed procedure and anticipated post-op course with patient and his daughter Consent to be obtained

## 2016-12-13 NOTE — Telephone Encounter (Signed)
Left message to call on VM  Can try again tomorrow

## 2016-12-13 NOTE — Anesthesia Procedure Notes (Signed)
Spinal  Start time: 12/13/2016 3:50 PM End time: 12/13/2016 3:55 PM Staffing Anesthesiologist: Linna Caprice, Jahmere Bramel Performed: anesthesiologist  Preanesthetic Checklist Completed: patient identified, site marked, surgical consent, pre-op evaluation, timeout performed, IV checked, risks and benefits discussed and monitors and equipment checked Spinal Block Patient position: right lateral decubitus Prep: DuraPrep Patient monitoring: heart rate, cardiac monitor, continuous pulse ox and blood pressure Approach: right paramedian Location: L3-4 Injection technique: single-shot Needle Needle type: Tuohy  Needle gauge: 22 G Needle length: 9 cm Needle insertion depth: 5 cm Assessment Sensory level: T6 Additional Notes 15 mg 0.75% Bupivacaine injected easily

## 2016-12-13 NOTE — Progress Notes (Signed)
Initial Nutrition Assessment  DOCUMENTATION CODES:   Not applicable  INTERVENTION:  - Diet advancement as medically feasible. - RD will continue to monitor for nutrition-related needs.  NUTRITION DIAGNOSIS:   Inadequate oral intake related to inability to eat as evidenced by NPO status.  GOAL:   Patient will meet greater than or equal to 90% of their needs  MONITOR:   Diet advancement, PO intake, Weight trends, Labs, Skin, I & O's  REASON FOR ASSESSMENT:   Malnutrition Screening Tool, Consult Hip fracture protocol  ASSESSMENT:   83 y.o. male with history of CAD status post stenting presents to the ER because of fall with right hip pain. X-rays revealed a right hip fracture. Denies any loss of consciousness or hitting his head on fall. Patient states he was walking with trash can when he suddenly tripped and fell. On call orthopedic surgeon Dr. Onnie Graham has been consulted. Patient will be admitted for right hip surgery.   Pt seen for MST and consult. BMI indicates normal weight/borderline overweight. He has been NPO since admission and states that he is feeling very hungry at this time. Pt reports that PTA he had a good appetite. He and wife, who is at bedside, have been using more frozen foods and eating out more often d/t difficulties with standing to cook as they are getting older. Pt greatly enjoys salads from several restaurants and his wife reports that he has been losing weight over the past ~1 year because of changes in food choices.   Physical assessment shows no muscle or fat wasting at this time. Per chart review, pt has lost 11 lbs (5.6% body weight) since 10/09/15 which is not significant for time frame. Pt being taken to OR shortly after RD visit.   Medications reviewed; daily multivitamin, PRN Zofran, 40 mg oral Protonix PRN. Labs reviewed; Ca: 8.6 mg/dL.  IVF: NS @ 50 mL/hr.    Diet Order:  Diet NPO time specified Diet NPO time specified Except for: Sips with  Meds  Skin:  Wound (see comment) (L axilla wound (present on admission))  Last BM:  1/8  Height:   Ht Readings from Last 1 Encounters:  12/13/16 '5\' 11"'$  (1.803 m)    Weight:   Wt Readings from Last 1 Encounters:  12/13/16 179 lb (81.2 kg)    Ideal Body Weight:  78.18 kg  BMI:  Body mass index is 24.97 kg/m.  Estimated Nutritional Needs:   Kcal:  1630-1875 (20-23 kcal/kg)  Protein:  65-75 grams  Fluid:  1.6-1.8 L/day  EDUCATION NEEDS:   No education needs identified at this time    Jarome Matin, MS, RD, LDN, CNSC Inpatient Clinical Dietitian Pager # [phone] After hours/weekend pager # [phone]

## 2016-12-13 NOTE — Consult Note (Signed)
   [facility] CM Inpatient Consult   12/13/2016  [name] Oct 07, 1930 [id number]     Patient screened for potential [facility] Care Management services. Chart reviewed. Noted surgery pending. No identifiable [facility] Care Management needs at this time.    Marthenia Rolling, MSN-Ed, RN,BSN [facility] Liaison [phone]

## 2016-12-13 NOTE — Telephone Encounter (Signed)
Wife called to see if pt's pathology report of lumpectomy is back yet. Also pt fell yesterday and is in the hospital, having surgery this afternoon around 3pm.

## 2016-12-13 NOTE — Transfer of Care (Signed)
Immediate Anesthesia Transfer of Care Note  Patient: [name]  Procedure(s) Performed: Procedure(s): INTRAMEDULLARY (IM) NAIL FEMORAL (Right)  Patient Location: PACU  Anesthesia Type:Spinal  Level of Consciousness: awake and patient cooperative  Airway & Oxygen Therapy: Patient Spontanous Breathing and Patient connected to face mask oxygen  Post-op Assessment: Report given to RN and Post -op Vital signs reviewed and stable  Post vital signs: stable  Last Vitals:  Vitals:   12/13/16 1530 12/13/16 1535  BP:    Pulse: (!) 54 (!) 51  Resp:    Temp:      Last Pain:  Vitals:   12/13/16 1530  TempSrc:   PainSc: 2       Patients Stated Pain Goal: 3 (92/95/74 7340)  Complications: No apparent anesthesia complications

## 2016-12-13 NOTE — Progress Notes (Signed)
BP (!) 142/57 (BP Location: Right Arm)   Pulse (!) 53   Temp 98.1 F (36.7 C) (Oral)   Resp (!) 21   Wt 81.5 kg (179 lb 10.8 oz)   SpO2 99%   BMI 25.06 kg/m  Agree with plan as stated by admitting physician, or 2-D echo in 2013 showed an EF of 60% with an unremarkable stress test. Couldn't keep the patient nothing by mouth orthopedic surgery has already seen the patient for possible ORIF on 12/13/2016. Check CBC tomorrow morning.

## 2016-12-13 NOTE — Anesthesia Preprocedure Evaluation (Signed)
Anesthesia Evaluation  Patient identified by MRN, date of birth, ID band Patient awake    Reviewed: Allergy & Precautions, NPO status , Patient's Chart, lab work & pertinent test results  Airway Mallampati: II  TM Distance: >3 FB Neck ROM: Full    Dental  (+) Teeth Intact, Dental Advisory Given   Pulmonary former smoker,    breath sounds clear to auscultation       Cardiovascular  Rhythm:Regular Rate:Normal     Neuro/Psych    GI/Hepatic   Endo/Other    Renal/GU      Musculoskeletal   Abdominal   Peds  Hematology   Anesthesia Other Findings   Reproductive/Obstetrics                             Anesthesia Physical Anesthesia Plan  ASA: III  Anesthesia Plan: MAC and Spinal   Post-op Pain Management:    Induction: Intravenous  Airway Management Planned: Simple Face Mask and Natural Airway  Additional Equipment:   Intra-op Plan:   Post-operative Plan:   Informed Consent: I have reviewed the patients History and Physical, chart, labs and discussed the procedure including the risks, benefits and alternatives for the proposed anesthesia with the patient or authorized representative who has indicated his/her understanding and acceptance.   Dental advisory given  Plan Discussed with: CRNA and Anesthesiologist  Anesthesia Plan Comments:         Anesthesia Quick Evaluation

## 2016-12-13 NOTE — Anesthesia Postprocedure Evaluation (Addendum)
Anesthesia Post Note  Patient: OVAL CAVAZOS  Procedure(s) Performed: Procedure(s) (LRB): INTRAMEDULLARY (IM) NAIL FEMORAL (Right)  Patient location during evaluation: PACU Anesthesia Type: MAC Level of consciousness: awake, awake and alert and oriented Pain management: pain level controlled Vital Signs Assessment: post-procedure vital signs reviewed and stable Respiratory status: spontaneous breathing, nonlabored ventilation and respiratory function stable Cardiovascular status: blood pressure returned to baseline Postop Assessment: no headache and spinal receding Anesthetic complications: no       Last Vitals:  Vitals:   12/13/16 1830 12/13/16 1843  BP: (!) 123/56   Pulse: (!) 53   Resp: 16 15  Temp: 36.4 C     Last Pain:  Vitals:   12/13/16 1745  TempSrc:   PainSc: Asleep                 Nawal Burling COKER

## 2016-12-13 NOTE — Brief Op Note (Signed)
12/12/2016 - 12/13/2016  2:19 PM  PATIENT:  [name]  83 y.o. male  PRE-OPERATIVE DIAGNOSIS:  Right hip intertrochantaric fracture  POST-OPERATIVE DIAGNOSIS:   Right hip intertrochantaric fracture  PROCEDURE:  Procedure(s): INTRAMEDULLARY (IM) NAIL FEMORAL (Right)  ORIF right hip fracture  SURGEON:  Surgeon(s) and Role:    * Paralee Cancel, MD - Primary  PHYSICIAN ASSISTANT: None  ASSISTANTS: Surgical team  ANESTHESIA:   general  EBL:  Total I/O In: -  Out: 50 [Urine:50]  200 cc  BLOOD ADMINISTERED:none  DRAINS: none   LOCAL MEDICATIONS USED:  NONE  SPECIMEN:  No Specimen  DISPOSITION OF SPECIMEN:  N/A  COUNTS:  YES  TOURNIQUET:  * No tourniquets in log *  DICTATION: .Other Dictation: Dictation Number [id number]  PLAN OF CARE: Admit to inpatient   PATIENT DISPOSITION:  PACU - hemodynamically stable.   Delay start of Pharmacological VTE agent (>24hrs) due to surgical blood loss or risk of bleeding: no

## 2016-12-14 ENCOUNTER — Telehealth: Payer: Self-pay | Admitting: Internal Medicine

## 2016-12-14 ENCOUNTER — Encounter (HOSPITAL_COMMUNITY): Payer: Self-pay | Admitting: Orthopedic Surgery

## 2016-12-14 LAB — CBC
HCT: 31.3 % — ABNORMAL LOW (ref 39.0–52.0)
Hemoglobin: 10.2 g/dL — ABNORMAL LOW (ref 13.0–17.0)
MCH: 29.1 pg (ref 26.0–34.0)
MCHC: 32.6 g/dL (ref 30.0–36.0)
MCV: 89.2 fL (ref 78.0–100.0)
Platelets: 132 10*3/uL — ABNORMAL LOW (ref 150–400)
RBC: 3.51 MIL/uL — ABNORMAL LOW (ref 4.22–5.81)
RDW: 13.4 % (ref 11.5–15.5)
WBC: 7.9 10*3/uL (ref 4.0–10.5)

## 2016-12-14 LAB — BASIC METABOLIC PANEL
ANION GAP: 7 (ref 5–15)
BUN: 17 mg/dL (ref 6–20)
CALCIUM: 8.5 mg/dL — AB (ref 8.9–10.3)
CHLORIDE: 103 mmol/L (ref 101–111)
CO2: 28 mmol/L (ref 22–32)
Creatinine, Ser: 0.92 mg/dL (ref 0.61–1.24)
GFR calc non Af Amer: >60 mL/min (ref >60)
GLUCOSE: 127 mg/dL — AB (ref 65–99)
Potassium: 3.8 mmol/L (ref 3.5–5.1)
Sodium: 138 mmol/L (ref 135–145)

## 2016-12-14 LAB — URINE CULTURE

## 2016-12-14 MED ORDER — MORPHINE SULFATE (PF) 2 MG/ML IV SOLN
2.0000 mg | INTRAVENOUS | Status: DC | PRN
  Administered 2016-12-14 (×2): 2 mg via INTRAVENOUS
  Filled 2016-12-14 (×4): qty 1

## 2016-12-14 NOTE — Telephone Encounter (Signed)
Discussed pathology findings with Jeanett Schlein husband is in the hospital postop from hip surgery from fracture from fall. He will be going into rehabilitation. We'll send her a copy of the pathology report also. DTRs in the chart. We'll get cancer Center referral and radiation nonurgently with the understanding that he will have to recover from his hip surgery. She states he had  a skin cancer on his health or head area in the remote past that had to be resected more than 1 time.

## 2016-12-14 NOTE — Evaluation (Signed)
Physical Therapy Evaluation Patient Details Name: KOLBY SCHARA MRN: [id number] DOB: 08-Aug-1930 Today's Date: 12/14/2016   History of Present Illness  (P) 83 yo male fell at home on 12/13/15 and sustained a right intertrochanteric hip fracture. S/P ORIF IM nail 12/13/16.  H/O CAD, recent L axillary node biopsy  Clinical Impression  The patient tolerated stand and pivot with Rw and 2 assist. Patient premedicated with IV meds and PO. Pt admitted with above diagnosis. Pt currently with functional limitations due to the deficits listed below (see PT Problem List).  Pt will benefit from skilled PT to increase their independence and safety with mobility to allow discharge to the venue listed below.      Follow Up Recommendations SNF;Supervision/Assistance - 24 hour    Equipment Recommendations  None recommended by PT    Recommendations for Other Services       Precautions / Restrictions Precautions Precautions: (P) Fall Restrictions RLE Weight Bearing: (P) Partial weight bearing      Mobility  Bed Mobility Overal bed mobility: Needs Assistance Bed Mobility: Supine to Sit     Supine to sit: HOB elevated;+2 for physical assistance;+2 for safety/equipment;Max assist     General bed mobility comments: assist with legs and trunk, bed pad used, the patient did self assist to scoot to bed edge.  Transfers Overall transfer level: Needs assistance Equipment used: Rolling walker (2 wheeled) Transfers: Sit to/from [facility] Sit to Stand: +2 physical assistance;+2 safety/equipment;From elevated surface;Max assist Stand pivot transfers: Max assist;+2 physical assistance;+2 safety/equipment       General transfer comment: Assist to power up to stand at RW, cues for hand placement and limited weight on the  right leg. Appears to place minimal weight. Assist  required to turn with RW and recliner brought up. Difficulty advancing the  right leg.  Assist to lower to recliner,  cues for hand placement.  Ambulation/Gait                Stairs            Wheelchair Mobility    Modified Rankin (Stroke Patients Only)       Balance Overall balance assessment: History of Falls;Needs assistance Sitting-balance support: Feet supported;Bilateral upper extremity supported Sitting balance-Leahy Scale: Fair     Standing balance support: During functional activity;Bilateral upper extremity supported Standing balance-Leahy Scale: Poor                               Pertinent Vitals/Pain Pain Assessment: (P) Faces Faces Pain Scale: (P) Hurts whole lot Pain Location: (P) R groin Pain Descriptors / Indicators: (P) Aching Pain Intervention(s): (P) Limited activity within patient's tolerance;Monitored during session;Premedicated before session;Repositioned;Ice applied    Home Living Family/patient expects to be discharged to:: (P) Skilled nursing facility Living Arrangements: Spouse/significant other                    Prior Function Level of Independence: (P) Independent               Hand Dominance        Extremity/Trunk Assessment   Upper Extremity Assessment Upper Extremity Assessment: (P) Overall WFL for tasks assessed    Lower Extremity Assessment Lower Extremity Assessment: RLE deficits/detail RLE Deficits / Details: requires assist to move in bed and to  advance the leg.     Cervical / Trunk Assessment Cervical / Trunk Assessment: Kyphotic  Communication  Communication: (P) No difficulties  Cognition Arousal/Alertness: (P) Awake/alert Behavior During Therapy: (P) WFL for tasks assessed/performed Overall Cognitive Status: (P) Within Functional Limits for tasks assessed Area of Impairment: Orientation Orientation Level: Time             General Comments: (P) mostly WFLs    General Comments      Exercises     Assessment/Plan    PT Assessment Patient needs continued PT services  PT Problem  List Decreased strength;Decreased range of motion;Decreased activity tolerance;Decreased balance;Decreased mobility;Decreased knowledge of precautions;Decreased safety awareness;Decreased knowledge of use of DME;Pain          PT Treatment Interventions DME instruction;Gait training;Functional mobility training;Therapeutic activities;Therapeutic exercise;Patient/family education    PT Goals (Current goals can be found in the Care Plan section)  Acute Rehab PT Goals Patient Stated Goal: agreed to getting up PT Goal Formulation: With patient/family Time For Goal Achievement: 12/28/16 Potential to Achieve Goals: Good    Frequency Min 3X/week   Barriers to discharge        Co-evaluation PT/OT/SLP Co-Evaluation/Treatment: Yes Reason for Co-Treatment: (P) Complexity of the patient's impairments (multi-system involvement) PT goals addressed during session: (P) Mobility/safety with mobility OT goals addressed during session: (P) ADL's and self-care       End of Session Equipment Utilized During Treatment: Gait belt Activity Tolerance: Patient tolerated treatment well Patient left: in chair;with call bell/phone within reach;with chair alarm set;with family/visitor present Nurse Communication: Mobility status         Time: 6270-3500 PT Time Calculation (min) (ACUTE ONLY): 16 min   Charges:   PT Evaluation $PT Eval Low Complexity: 1 Procedure     PT G CodesClaretha Cooper 12/14/2016, 12:01 PM Tresa Endo PT [phone]

## 2016-12-14 NOTE — Evaluation (Signed)
Occupational Therapy Evaluation Patient Details Name: [name] MRN: [id number] DOB: 1930-01-06 Today's Date: 12/14/2016    History of Present Illness 83 yo male fell at home on 12/13/15 and sustained a right intertrochanteric hip fracture. S/P ORIF IM nail 12/13/16.  H/O CAD, recent L axillary node biopsy   Clinical Impression   Pt was admitted for the above. He will benefit from continued OT to increase safety and independence with adls. Goals in acute are for min A level.  He was independent prior to admission, and he needs max to total +2 assistance for mobility and LB dressing.  Pt was limited by pain, despite premedication, and he was motivated during session    Follow Up Recommendations  SNF    Equipment Recommendations  3 in 1 bedside commode    Recommendations for Other Services       Precautions / Restrictions Precautions Precautions: Fall Restrictions RLE Weight Bearing: Partial weight bearing      Mobility Bed Mobility Overal bed mobility: Needs Assistance Bed Mobility: Supine to Sit     Supine to sit: HOB elevated;+2 for physical assistance;+2 for safety/equipment;Max assist     General bed mobility comments: assist with legs and trunk, bed pad used, the patient did self assist to scoot to bed edge.  Transfers Overall transfer level: Needs assistance Equipment used: Rolling walker (2 wheeled) Transfers: Sit to/from [facility] Sit to Stand: +2 physical assistance;+2 safety/equipment;From elevated surface;Max assist Stand pivot transfers: Max assist;+2 physical assistance;+2 safety/equipment       General transfer comment: Assist to power up to stand at RW, cues for hand placement and limited weight on the  right leg. Appears to place minimal weight. Assist  required to turn with RW and recliner brought up. Difficulty advancing the  right leg.  Assist to lower to recliner, cues for hand placement.    Balance Overall balance  assessment: History of Falls;Needs assistance Sitting-balance support: Feet supported;Bilateral upper extremity supported Sitting balance-Leahy Scale: Fair     Standing balance support: During functional activity;Bilateral upper extremity supported Standing balance-Leahy Scale: Poor                              ADL Overall ADL's : Needs assistance/impaired     Grooming: Set up;Sitting   Upper Body Bathing: Set up;Sitting   Lower Body Bathing: Maximal assistance;Sit to/from stand;+2 for physical assistance;+2 for safety/equipment   Upper Body Dressing : Minimal assistance;Sitting   Lower Body Dressing: Total assistance;+2 for physical assistance;+2 for safety/equipment;Sit to/from stand   Toilet Transfer: Maximal assistance;+2 for physical assistance;+2 for safety/equipment;Stand-pivot;BSC;RW (to recliner)             General ADL Comments: did not educate on AE this session, but pt would benefit from reacher or long sponge for bathing.  Unable to lift RLE for adls at this time     Vision     Perception     Praxis      Pertinent Vitals/Pain Pain Assessment: Faces Faces Pain Scale: Hurts whole lot Pain Location: R groin Pain Descriptors / Indicators: Aching Pain Intervention(s): Limited activity within patient's tolerance;Monitored during session;Premedicated before session;Repositioned;Ice applied     Hand Dominance     Extremity/Trunk Assessment Upper Extremity Assessment Upper Extremity Assessment: Overall WFL for tasks assessed      Cervical / Trunk Assessment Cervical / Trunk Assessment: Kyphotic   Communication Communication Communication: No difficulties  Cognition Arousal/Alertness: Awake/alert Behavior During Therapy: WFL for tasks assessed/performed Overall Cognitive Status: Within Functional Limits for tasks assessed Area of Impairment: Orientation Orientation Level: Time             General Comments: mostly WFLs    General Comments       Exercises       Shoulder Instructions      Home Living Family/patient expects to be discharged to:: Skilled nursing facility Living Arrangements: Spouse/significant other                                      Prior Functioning/Environment Level of Independence: Independent                 OT Problem List: Decreased strength;Decreased activity tolerance;Impaired balance (sitting and/or standing);Pain;Decreased knowledge of use of DME or AE   OT Treatment/Interventions: Self-care/ADL training;DME and/or AE instruction;Patient/family education    OT Goals(Current goals can be found in the care plan section) Acute Rehab OT Goals Patient Stated Goal: agreed to getting up OT Goal Formulation: With patient Time For Goal Achievement: 12/21/16 Potential to Achieve Goals: Good ADL Goals Pt Will Perform Lower Body Bathing: with min assist;sit to/from stand;with adaptive equipment Pt Will Transfer to Toilet: with min assist;bedside commode;stand pivot transfer Pt Will Perform Toileting - Clothing Manipulation and hygiene: with min assist;sit to/from stand  OT Frequency: Min 2X/week   Barriers to D/C:            Co-evaluation   Reason for Co-Treatment: Complexity of the patient's impairments (multi-system involvement) PT goals addressed during session: Mobility/safety with mobility OT goals addressed during session: ADL's and self-care      End of Session Nurse Communication:  (alarm box not found in room)  Activity Tolerance: Patient limited by pain Patient left: in bed;with call bell/phone within reach;with family/visitor present   Time: 6195-0932 OT Time Calculation (min): 26 min Charges:  OT General Charges $OT Visit: 1 Procedure OT Evaluation $OT Eval Low Complexity: 1 Procedure G-Codes:    Anyjah Roundtree 01-12-17, 12:06 PM Lesle Chris, OTR/L [phone] 01/12/17

## 2016-12-14 NOTE — Progress Notes (Signed)
[facility] PROGRESS NOTE    Progress Note  [name]  VVO:[id number] DOB: [birth year] DOA: 12/12/2016 PCP: Lottie Dawson, MD     Brief Narrative:   KEILAND Hughes is an 83 y.o. male male with history of CAD status post stenting presents to the ER because of fall with right hip pain. X-rays revealed a right hip fracture. Denies any loss of consciousness or hitting his head on fall. Patient states he was walking with trash can when he suddenly tripped and fell.  Assessment/Plan:   Mechanical fall leading to Closed right hip fracture (HCC) Status post right hip intertrochanteric factor on 12/13/2016. Anticoagulation and pain control per orthopedic surgery. PT evaluation is pending.  Hx Coronary atherosclerosis Continue metoprolol and aspirin.  Essential hypertension Continue metoprolol.  Cognitive impairment: Continue Aricept.  DVT prophylaxis: anticoagulation per ortho Family Communication:wife Disposition Plan/Barrier to D/C: home probably in the am Code Status:     Code Status Orders        Start     Ordered   12/12/16 2216  Full code  Continuous     12/12/16 2218    Code Status History    Date Active Date Inactive Code Status Order ID Comments User Context   This patient has a current code status but no historical code status.    Advance Directive Documentation   Flowsheet Row Most Recent Value  Type of Advance Directive  Healthcare Power of Attorney, Living will  Pre-existing out of facility DNR order (yellow form or pink MOST form)  No data  "MOST" Form in Place?  No data        IV Access:    Peripheral IV   Procedures and diagnostic studies:   Dg Chest 1 View  Result Date: 12/12/2016 CLINICAL DATA:  Fall, fractured hip EXAM: CHEST 1 VIEW COMPARISON:  10/02/2015 FINDINGS: Stable calcified right mid lung zone nodule. Coarse interstitial opacities, likely chronic. No acute consolidation or effusion. Stable cardiomediastinal silhouette  with tortuous calcified aorta. Coronary calcification or possible stent. No pneumothorax. IMPRESSION: 1. Probable coarse interstitial opacities at the bilateral lung bases. No acute infiltrate or edema 2. Atherosclerosis of the aorta Electronically Signed   By: Donavan Foil M.D.   On: 12/12/2016 20:22   Dg C-arm 1-60 Min-no Report  Result Date: 12/13/2016 There is no Radiologist interpretation  for this exam.  Dg Hip Unilat With Pelvis 2-3 Views Right  Result Date: 12/13/2016 CLINICAL DATA:  IM nail RIGHT hip EXAM: DG HIP (WITH OR WITHOUT PELVIS) 2-3V RIGHT COMPARISON:  12/12/2016 FLUOROSCOPY TIME:  0 minutes 54.3 seconds Dose:  5.0 mGy Images obtained:  2 FINDINGS: Osseous demineralization. IM nail with compression screw placed across a reduced fracture of the intertrochanteric region of the RIGHT femur. No dislocation seen. Distal extent of IM nail not imaged. IMPRESSION: Post nailing of intertrochanteric fracture RIGHT femur. Electronically Signed   By: Lavonia Dana M.D.   On: 12/13/2016 17:33   Dg Hip Unilat  With Pelvis 2-3 Views Right  Result Date: 12/12/2016 CLINICAL DATA:  Fall taking out the garbage EXAM: DG HIP (WITH OR WITHOUT PELVIS) 2-3V RIGHT COMPARISON:  None. FINDINGS: Surgical clips within the pelvis. SI joints grossly symmetric. Left femoral head projects in joint. Comminuted intertrochanteric fracture of the right femur with superior migration of the shaft of the femur. Right femoral head projects in joint. Suspect old right superior and inferior pubic rami fractures. IMPRESSION: 1. Comminuted intertrochanteric fracture of the proximal right femur. Right  femoral head projects in joint 2. Suspect old fractures of the right superior inferior pubic rami Electronically Signed   By: Donavan Foil M.D.   On: 12/12/2016 20:20     Medical Consultants:    None.  Anti-Infectives:   None  Subjective:    [name] relates his pain is not controlled.  Objective:    Vitals:    12/13/16 1830 12/13/16 1843 12/13/16 2109 12/14/16 0447  BP: (!) 123/56 (!) 147/57 (!) 154/57 (!) 141/51  Pulse: (!) 53 (!) 48 (!) 58 63  Resp: '16 15 18 18  '$ Temp: 97.5 F (36.4 C) 98.4 F (36.9 C) 98.3 F (36.8 C) 97.9 F (36.6 C)  TempSrc:   Oral Oral  SpO2: 100% 100% 100% 98%  Weight:      Height:        Intake/Output Summary (Last 24 hours) at 12/14/16 0821 Last data filed at 12/14/16 0447  Gross per 24 hour  Intake             1300 ml  Output             1450 ml  Net             -150 ml   Filed Weights   12/13/16 0552 12/13/16 1357  Weight: 81.5 kg (179 lb 10.8 oz) 81.2 kg (179 lb)    Exam: General exam: In no acute distress. Respiratory system: Good air movement and clear to auscultation. Cardiovascular system: S1 & S2 heard, RRR. No JVD, murmurs, rubs, gallops or clicks.  Gastrointestinal system: Abdomen is nondistended, soft and nontender.  Central nervous system: Alert and oriented. No focal neurological deficits. Extremities: No pedal edema. Skin: No rashes, lesions or ulcers Psychiatry: Judgement and insight appear normal. Mood & affect appropriate.    Data Reviewed:    Labs: Basic Metabolic Panel:  Recent Labs Lab 12/12/16 1913 12/13/16 0553 12/14/16 0600  NA 139 140 138  K 4.1 4.0 3.8  CL 105 106 103  CO2 '28 28 28  '$ GLUCOSE 117* 134* 127*  BUN 22* 21* 17  CREATININE 0.91 0.67 0.92  CALCIUM 8.7* 8.6* 8.5*   GFR Estimated Creatinine Clearance: 63.7 mL/min (by C-G formula based on SCr of 0.92 mg/dL). Liver Function Tests: No results for input(s): AST, ALT, ALKPHOS, BILITOT, PROT, ALBUMIN in the last 168 hours. No results for input(s): LIPASE, AMYLASE in the last 168 hours. No results for input(s): AMMONIA in the last 168 hours. Coagulation profile No results for input(s): INR, PROTIME in the last 168 hours.  CBC:  Recent Labs Lab 12/12/16 1913 12/13/16 0553 12/14/16 0600  WBC 6.4 8.5 7.9  NEUTROABS 5.3  --   --   HGB 12.2* 11.2*  10.2*  HCT 37.4* 34.2* 31.3*  MCV 87.0 88.4 89.2  PLT 159 156 132*   Cardiac Enzymes: No results for input(s): CKTOTAL, CKMB, CKMBINDEX, TROPONINI in the last 168 hours. BNP (last 3 results) No results for input(s): PROBNP in the last 8760 hours. CBG: No results for input(s): GLUCAP in the last 168 hours. D-Dimer: No results for input(s): DDIMER in the last 72 hours. Hgb A1c: No results for input(s): HGBA1C in the last 72 hours. Lipid Profile: No results for input(s): CHOL, HDL, LDLCALC, TRIG, CHOLHDL, LDLDIRECT in the last 72 hours. Thyroid function studies: No results for input(s): TSH, T4TOTAL, T3FREE, THYROIDAB in the last 72 hours.  Invalid input(s): FREET3 Anemia work up: No results for input(s): VITAMINB12, FOLATE, FERRITIN, TIBC, IRON,  RETICCTPCT in the last 72 hours. Sepsis Labs:  Recent Labs Lab 12/12/16 1913 12/13/16 0553 12/14/16 0600  WBC 6.4 8.5 7.9   Microbiology Recent Results (from the past 240 hour(s))  Surgical PCR screen     Status: None   Collection Time: 12/13/16  5:21 AM  Result Value Ref Range Status   MRSA, PCR NEGATIVE NEGATIVE Final   Staphylococcus aureus NEGATIVE NEGATIVE Final    Comment:        The Xpert SA Assay (FDA approved for NASAL specimens in patients over 74 years of age), is one component of a comprehensive surveillance program.  Test performance has been validated by [facility] for patients greater than or equal to 38 year old. It is not intended to diagnose infection nor to guide or monitor treatment.      Medications:   . donepezil  10 mg Oral QHS  . enoxaparin (LOVENOX) injection  40 mg Subcutaneous Q24H  . ferrous sulfate  325 mg Oral TID PC  . metoprolol tartrate  25 mg Oral BID  . multivitamin  1 tablet Oral BID   Continuous Infusions:  Time spent: 25 min   LOS: 2 days   Charlynne Cousins  [facility] Pager [phone]  *Please refer to [email or web address], password [email or web address] to get updated schedule on  who will round on this patient, as hospitalists switch teams weekly. If 7PM-7AM, please contact night-coverage at [email or web address], password [email or web address] for any overnight needs.  12/14/2016, 8:21 AM

## 2016-12-14 NOTE — Op Note (Signed)
NAMEDEVONN, [name] NO.:  [id number]  MEDICAL RECORD NO.:  [id number]  LOCATION:                                 FACILITY:  PHYSICIAN:  [name], M.D.       DATE OF BIRTH:  DATE OF PROCEDURE:  12/13/2016 DATE OF DISCHARGE:                              OPERATIVE REPORT   PREOPERATIVE DIAGNOSIS:  Comminuted right intertrochanteric femur fracture.  POSTOPERATIVE DIAGNOSIS:  Comminuted right intertrochanteric femur fracture.  PROCEDURE:  Open reduction and internal fixation of right intertrochanteric femur fracture utilizing a Biomet troch nail AFFIXUS 11 x 180 mm with 130-degree lag screw measuring 110 mm that I locked in position due to the comminuted nature of the proximal femur and the reduction was obtained and distal interlock.  SURGEON:  [name], M.D.  ASSISTANT:  Surgical team.  ANESTHESIA:  General.  SPECIMENS:  None.  COMPLICATIONS:  None.  BLOOD LOSS:  About 200 mL.  BLOOD ADMINISTERED:  None.  INDICATIONS FOR PROCEDURE:  Mr. [name] is an 83 year old gentleman, patient of mine from previous knee arthroplasties.  He fell while taking the trash out, landing on his right hip.  He had inability to bear weight or to walk.  He was brought to the emergency room yesterday, where radiographs revealed a comminuted intertrochanteric femur fracture.  He was admitted to the Medical Service and seen by my partner, Dr. Justice Britain, and initial consult made.  Based on my interaction with him in the past, I was asked to be involved in his care.  He was seen and evaluated.  Indications and procedure were discussed.  Risks and benefits were discussed including risk of infection, nonunion, malunion, need for future surgeries.  Consent was obtained for benefit of fracture management and pain relief.  PROCEDURE IN DETAIL:  The patient was brought to the operative theater. Once adequate anesthesia, preoperative antibiotics, Ancef  administered, he was positioned supine on the OSI HANA fracture table with the left leg flexed and abducted out of the way.  The right leg was placed in a traction boot.  Once adequately positioned with bony prominences padded, fluoroscopy was brought to the field.  The right leg was then internally rotated to about 10 to 20 degrees and applied traction.  Under fluoroscopic imaging, the fracture was noted to line up anatomically along the medial calcar, but significant comminution in the proximal trochanteric region.  This was confirmed in AP and lateral planes.  Once this was done, the right hip was prepped and draped in sterile fashion using shower curtain technique.  Time-out was performed identifying the patient, planned procedure, and extremity.  Fluoroscopy was brought back to the field.  We identified the tip of the trochanter laterally and incision was made.  The iliotibial band was then split, guidewire was inserted into the proximal femur, and passed across the fracture site into the proximal femur.  At this point, I drilled the proximal femur and then inserted the 11 x 180 mm nail to its appropriate depth.  Then, using the insertion handle, a guidewire was inserted into the femoral head in AP and lateral planes in the  center.  I measured and selected a 110 mm lag screw.  I drilled for this and passed 110 mm lag screw and then used the compression wheel and medialized the femur to the neck of the femur.  Once this was done, it was locked into position.  Due to the comminuted nature of the fracture, I did not want any further compression.  I felt there was adequate contact of the bone at this point.  Once this was done, the distal interlock was placed using the insertion jig as well as the guide.  This measured 38 mm.  The final x-rays were obtained in the AP and lateral planes.  The wounds were irrigated with normal saline solution.  The proximal wound was closed in  layers using #1 Vicryl on the gluteal fascia.  The remainder of the wounds were closed with 2-0 Vicryl and staples on the skin.  The skin was then cleaned, dried, and dressed sterilely using a long Mepilex dressing.  He was then brought to the recovery room in a stable condition tolerating the procedure well.  Findings were reviewed with his family.  We will have him be partial weightbearing initially and have routine followup at a 2 to 4 week interval until we have adequate fraction union.     [name], M.D.     MDO/MEDQ  D:  12/13/2016  T:  12/14/2016  Job:  [id number]

## 2016-12-14 NOTE — NC FL2 (Signed)
[place] LEVEL OF CARE SCREENING TOOL     IDENTIFICATION  Patient Name: [name] Birthdate: Dec 19, 1929 Sex: male Admission Date (Current Location): 12/12/2016  [facility] and [place] Number:  Herbalist and Address:  [facility],  [place], [place]      Provider Number: [id number]  Attending Physician Name and Address:  Charlynne Cousins, MD  Relative Name and Phone Number:       Current Level of Care: Hospital Recommended Level of Care: [place] Prior Approval Number:    Date Approved/Denied:   PASRR Number: [id number]  Discharge Plan: SNF    Current Diagnoses: Patient Active Problem List   Diagnosis Date Noted  . Closed right hip fracture ([place]) 12/12/2016  . Other fatigue 09/27/2016  . Influenza vaccination declined by patient 09/27/2016  . Anxiety state 10/10/2015  . Abnormal chest x-ray 10/02/2015  . 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 Memory loss 04/13/2015  . Chronically on opiate therapy 12/02/2014  . Visit for preventive health examination 04/30/2014  . Pain management 04/30/2014  . Essential hypertension 09/19/2013  . Medication management 08/19/2012  . Wears hearing aid 02/19/2012  . Macular degeneration 02/19/2012  . Opiate use 02/19/2012  . LESION, FACE 03/17/2010  . SYNCOPE 03/17/2010  . HYPERKERATOSIS 10/06/2009  . OSTEOARTHRITIS 05/22/2009  . HYPOTHYROIDISM 07/02/2008  . ANEMIA 07/02/2008  . FATIGUE 07/02/2008  . TOTAL KNEE REPLACEMENT, RIGHT, HX OF 07/02/2008  . ADENOMATOUS COLONIC POLYP 01/31/2008  . HEMORRHOIDS 01/31/2008  . COPD 01/31/2008  . HIATAL HERNIA 01/31/2008  . DIVERTICULOSIS OF COLON 01/31/2008  . RENAL CALCULUS, HX OF 01/31/2008  . DERMATITIS 11/06/2007  . ARTHRITIS 11/06/2007  . HYPERLIPIDEMIA 06/20/2007  . Coronary atherosclerosis 06/20/2007  . GERD 06/20/2007  . PROSTATE CANCER, HX OF 06/20/2007     Orientation RESPIRATION BLADDER Height & Weight     Self, Situation, Place  Normal Continent Weight: 179 lb (81.2 kg) Height:  '5\' 11"'$  (180.3 cm)  BEHAVIORAL SYMPTOMS/MOOD NEUROLOGICAL BOWEL NUTRITION STATUS      Continent Diet (Full Liquid)  AMBULATORY STATUS COMMUNICATION OF NEEDS Skin   Extensive Assist Verbally Surgical wounds (Incision (Closed) 12/13/16 Axilla Left & Incision (Closed) 12/13/16 Hip)                       Personal Care Assistance Level of Assistance  Bathing, Dressing Bathing Assistance: Limited assistance   Dressing Assistance: Limited assistance     Functional Limitations Info             [place]  PT (By licensed PT), OT (By licensed OT)     PT Frequency: 5 OT Frequency: 5            Contractures      Additional Factors Info  Code Status, Allergies Code Status Info: Fullcode Allergies Info: Pravastatin, Aspirin, Meperidine Hcl           Current Medications (12/14/2016):  This is the current hospital active medication list Current Facility-Administered Medications  Medication Dose Route Frequency Provider Last Rate Last Dose  . donepezil (ARICEPT) tablet 10 mg  10 mg Oral QHS Rise Patience, MD   10 mg at 12/13/16 2254  . enoxaparin (LOVENOX) injection 40 mg  40 mg Subcutaneous Q24H Danae Orleans, PA-C   40 mg at 12/14/16 0802  . ferrous sulfate tablet 325 mg  325 mg Oral TID PC Rodman Key  Babish, PA-C   325 mg at 12/14/16 0802  . HYDROcodone-acetaminophen (NORCO/VICODIN) 5-325 MG per tablet 1-2 tablet  1-2 tablet Oral Q6H PRN Rise Patience, MD   2 tablet at 12/14/16 0801  . menthol-cetylpyridinium (CEPACOL) lozenge 3 mg  1 lozenge Oral PRN Danae Orleans, PA-C       Or  . phenol (CHLORASEPTIC) mouth spray 1 spray  1 spray Mouth/Throat PRN Danae Orleans, PA-C      . methocarbamol (ROBAXIN) tablet 500 mg  500 mg Oral Q6H PRN Danae Orleans, PA-C       Or  . methocarbamol (ROBAXIN) 500 mg in  dextrose 5 % 50 mL IVPB  500 mg Intravenous Q6H PRN Danae Orleans, PA-C   500 mg at 12/13/16 1813  . metoCLOPramide (REGLAN) tablet 5-10 mg  5-10 mg Oral Q8H PRN Danae Orleans, PA-C       Or  . metoCLOPramide (REGLAN) injection 5-10 mg  5-10 mg Intravenous Q8H PRN Danae Orleans, PA-C      . metoprolol tartrate (LOPRESSOR) tablet 25 mg  25 mg Oral BID Rhetta Mura Schorr, NP   25 mg at 12/14/16 1041  . morphine 2 MG/ML injection 0.5 mg  0.5 mg Intravenous Q2H PRN Rise Patience, MD   0.5 mg at 12/14/16 0543  . morphine 2 MG/ML injection 2 mg  2 mg Intravenous Q3H PRN Charlynne Cousins, MD   2 mg at 12/14/16 1042  . multivitamin (PROSIGHT) tablet 1 tablet  1 tablet Oral BID Rise Patience, MD   1 tablet at 12/14/16 1041  . ondansetron (ZOFRAN) injection 4 mg  4 mg Intravenous Q6H PRN Charlynne Cousins, MD   4 mg at 12/13/16 1406  . pantoprazole (PROTONIX) EC tablet 40 mg  40 mg Oral Daily PRN Rise Patience, MD         Discharge Medications: Please see discharge summary for a list of discharge medications.  Relevant Imaging Results:  Relevant Lab Results:   Additional Information SSN: [id number]  [name], LCSW

## 2016-12-14 NOTE — Progress Notes (Signed)
Plan for d/c to SNF, discharge planning per CSW. [phone] 

## 2016-12-14 NOTE — Telephone Encounter (Signed)
Answered in previous note. Phone message.

## 2016-12-14 NOTE — Telephone Encounter (Signed)
° ° ° °  Wife call to ask if she could received a call back about the results of a pathology report

## 2016-12-15 DIAGNOSIS — I10 Essential (primary) hypertension: Secondary | ICD-10-CM

## 2016-12-15 DIAGNOSIS — I2510 Atherosclerotic heart disease of native coronary artery without angina pectoris: Secondary | ICD-10-CM

## 2016-12-15 DIAGNOSIS — S72001A Fracture of unspecified part of neck of right femur, initial encounter for closed fracture: Secondary | ICD-10-CM

## 2016-12-15 LAB — BASIC METABOLIC PANEL
Anion gap: 4 — ABNORMAL LOW (ref 5–15)
BUN: 18 mg/dL (ref 6–20)
CALCIUM: 8.3 mg/dL — AB (ref 8.9–10.3)
CHLORIDE: 101 mmol/L (ref 101–111)
CO2: 31 mmol/L (ref 22–32)
CREATININE: 0.87 mg/dL (ref 0.61–1.24)
GFR calc Af Amer: >60 mL/min (ref >60)
GFR calc non Af Amer: >60 mL/min (ref >60)
Glucose, Bld: 114 mg/dL — ABNORMAL HIGH (ref 65–99)
Potassium: 3.8 mmol/L (ref 3.5–5.1)
SODIUM: 136 mmol/L (ref 135–145)

## 2016-12-15 LAB — CBC
HCT: 28.8 % — ABNORMAL LOW (ref 39.0–52.0)
HEMOGLOBIN: 9.3 g/dL — AB (ref 13.0–17.0)
MCH: 28.9 pg (ref 26.0–34.0)
MCHC: 32.3 g/dL (ref 30.0–36.0)
MCV: 89.4 fL (ref 78.0–100.0)
PLATELETS: 125 10*3/uL — AB (ref 150–400)
RBC: 3.22 MIL/uL — ABNORMAL LOW (ref 4.22–5.81)
RDW: 13.3 % (ref 11.5–15.5)
WBC: 7.5 10*3/uL (ref 4.0–10.5)

## 2016-12-15 MED ORDER — ENOXAPARIN SODIUM 40 MG/0.4ML ~~LOC~~ SOLN: 40.0000 mg | SUBCUTANEOUS | 0 refills | Status: DC

## 2016-12-15 MED ORDER — HYDROCODONE-ACETAMINOPHEN 5-325 MG PO TABS: 1.0000 | ORAL_TABLET | Freq: Four times a day (QID) | ORAL | 0 refills | Status: DC | PRN

## 2016-12-15 NOTE — Clinical Social Work Note (Signed)
Clinical Social Work Assessment  Patient Details  Name: [name] MRN: [id number] Date of Birth: 1930/12/03  Date of referral:  12/15/16               Reason for consult:  Facility Placement, Discharge Planning                Permission sought to share information with:  Family Supports, Customer service manager, Case Optician, dispensing granted to share information::  Yes, Verbal Permission Granted  Name::      [name] )  Agency::   (SNF's )  Relationship::   (Spouse )  Contact Information:   [phone])  Housing/Transportation Living arrangements for the past 2 months:  Single Family Home Source of Information:  Patient, Spouse Patient Interpreter Needed:  None Criminal Activity/Legal Involvement Pertinent to Current Situation/Hospitalization:  No - Comment as needed Significant Relationships:  Adult Children, Spouse Lives with:  Spouse Do you feel safe going back to the place where you live?  Yes Need for family participation in patient care:  Yes (Comment)  Care giving concerns:  Patient admitted from home with spouse after having a fall.    Social Worker assessment / plan:  MSW met with patient and patient's spouse, Derrick Schlein in reference to post-acute placement for SNF. MSW introduced MSW role and SNF process. MSW also reviewed and provided SNF list with available bed offers. Pt's wife reviewed bed offers and reported having church family in several different SNF's throughout the county. Patient stated he remembers visiting [facility]. MSW reviewed insurance coverage for SNF placement and that patient does NOT need 3 midnight qualifying stay under [facility] Medicare. Patient and wife informed of a possible discharge for today. No further concerns reported at this time. MSW will continue to follow pt and pt's family for continued support and to facilitate dc needs once stable.   Employment status:  Retired Office manager PT Recommendations:  [place] / Referral to community resources:  [place]  Patient/Family's Response to care:  Patient a/o x4. Patient eating lunch during assessment and would like wife to choose facility. Wife supportive and strongly involved in pt's care. Patient and pt's wife agreeable to SNF placement, pleasant and appreciated social work intervention.   Patient/Family's Understanding of and Emotional Response to Diagnosis, Current Treatment, and Prognosis:  Patient and wife expressed understanding of medical/surgical interventions.   Emotional Assessment Appearance:  Appears stated age Attitude/Demeanor/Rapport:   (Polite ) Affect (typically observed):  Accepting, Calm, Pleasant, Happy Orientation:  Oriented to Situation, Oriented to  Time, Oriented to Place, Oriented to Self Alcohol / Substance use:  Not Applicable Psych involvement (Current and /or in the community):  No (Comment)  Discharge Needs  Concerns to be addressed:  Denies Needs/Concerns at this time Readmission within the last 30 days:  No Current discharge risk:  Dependent with Mobility Barriers to Discharge:  Continued Medical Work up   Glendon Axe A 12/15/2016, 12:13 PM

## 2016-12-15 NOTE — Progress Notes (Signed)
     Subjective: 2 Days Post-Op Procedure(s) (LRB): INTRAMEDULLARY (IM) NAIL FEMORAL (Right)   Patient reports pain as mild, pain controlled with medications.  No events throughout the night. Orthopaedically he is stable.  Objective:   VITALS:   Vitals:   12/15/16 0555 12/15/16 1214  BP: 102/83 (!) 121/50  Pulse: 71 73  Resp: 18   Temp: 99.3 F (37.4 C)     Dorsiflexion/Plantar flexion intact Incision: scant drainage No cellulitis present Compartment soft  LABS  Recent Labs  12/13/16 0553 12/14/16 0600 12/15/16 0513  HGB 11.2* 10.2* 9.3*  HCT 34.2* 31.3* 28.8*  WBC 8.5 7.9 7.5  PLT 156 132* 125*     Recent Labs  12/13/16 0553 12/14/16 0600 12/15/16 0513  NA 140 138 136  K 4.0 3.8 3.8  BUN 21* 17 18  CREATININE 0.67 0.92 0.87  GLUCOSE 134* 127* 114*     Assessment/Plan: 2 Days Post-Op Procedure(s) (LRB): INTRAMEDULLARY (IM) NAIL FEMORAL (Right) Orthopaedically stable Up with therapy Discharge to SNF, when ready medically.   Ortho recommendations:  Lovenox 40 qd for 14 days for anticoagulation (Rx written), unless other medically indicated.  Norco for pain management (Rx written).  MiraLax and Colace for constipation.  Iron 325 mg tid for 2-3 weeks.  PWB 50% on the right leg.  Dressing to remain in place for 4 days, then replace with gauze and tape.  Keep the area clean and dry.  Follow up in 2 weeks at [facility]. Follow up with OLIN,Kartel Wolbert D in 2 weeks.  Contact information:  [facility] [address] [place]   PAC  12/15/2016, 12:33 PM

## 2016-12-15 NOTE — Progress Notes (Signed)
PHYSICAL THERAPY NOTE:  DR. [name], PLEASE SPECIFY PARTIAL  WEIGHT BEARING %. THANK YOU. Tresa Endo PT [phone]

## 2016-12-15 NOTE — Clinical Social Work Note (Signed)
Patient accepted bed offer at [facility] once medically stable for discharge.   Facility notified.   Glendon Axe, MSW [phone] 12/15/2016 3:16 PM

## 2016-12-15 NOTE — Progress Notes (Signed)
Physical Therapy Treatment Patient Details Name: LAMONE FERRELLI MRN: [id number] DOB: Jan 31, 1930 Today's Date: 12/15/2016    History of Present Illness 83 yo male fell at home on 12/13/15 and sustained a right intertrochanteric hip fracture. S/P ORIF IM nail 12/13/16.  H/O CAD, recent L axillary node biopsy    PT Comments    The  Patient is quite delightful and cracking jokes/ Able to start a few steps of ambulation. Plans SNF.   Follow Up Recommendations  SNF;Supervision/Assistance - 24 hour     Equipment Recommendations  None recommended by PT    Recommendations for Other Services       Precautions / Restrictions Precautions Precautions: Fall Restrictions RLE Weight Bearing: Partial weight bearing RLE Partial Weight Bearing Percentage or Pounds: 50%    Mobility  Bed Mobility Overal bed mobility: Needs Assistance Bed Mobility: Supine to Sit     Supine to sit: HOB elevated;+2 for safety/equipment;+2 for physical assistance;Mod assist     General bed mobility comments: assist with legs and trunk, bed pad used, the patient did self assist to scoot to bed edge.  Transfers Overall transfer level: Needs assistance Equipment used: Rolling walker (2 wheeled) Transfers: Sit to/from [facility] Sit to Stand: +2 physical assistance;+2 safety/equipment;From elevated surface;Max assist         General transfer comment: Assist to power up to stand at [facility], cues for hand placement . the  patient was able to assist to rise. multimodal cues to sit down  Ambulation/Gait Ambulation/Gait assistance: Mod assist;+2 physical assistance;+2 safety/equipment Ambulation Distance (Feet): 6 Feet Assistive device: Rolling walker (2 wheeled) Gait Pattern/deviations: Step-to pattern     General Gait Details: cues to advance the right leg. Able to maintain PWB   Stairs            Wheelchair Mobility    Modified Rankin (Stroke Patients Only)       Balance                                    Cognition Arousal/Alertness: Awake/alert                          Exercises Total Joint Exercises Ankle Circles/Pumps: AROM;Both;10 reps Heel Slides: AAROM;10 reps;Right    General Comments        Pertinent Vitals/Pain Faces Pain Scale: Hurts little more Pain Location: r hip Pain Descriptors / Indicators: Discomfort Pain Intervention(s): Monitored during session;Premedicated before session    Home Living                      Prior Function            PT Goals (current goals can now be found in the care plan section) Progress towards PT goals: Progressing toward goals    Frequency    Min 3X/week      PT Plan Current plan remains appropriate    Co-evaluation             End of Session Equipment Utilized During Treatment: Gait belt Activity Tolerance: Patient tolerated treatment well Patient left: in chair;with call bell/phone within reach;with chair alarm set;with family/visitor present     Time: 6387-5643 PT Time Calculation (min) (ACUTE ONLY): 29 min  Charges:  $Gait Training: 23-37 mins  G Codes:      Claretha Cooper 12/15/2016, 4:51 PM Tresa Endo PT [phone]

## 2016-12-15 NOTE — Progress Notes (Signed)
[facility] PROGRESS NOTE   Progress Note  [name]  TFT:[id number] DOB: 1930/08/22 DOA: 12/12/2016 PCP: Lottie Dawson, MD   Brief Narrative:   [name] is an 83 y.o. male male with history of CAD status post stenting presents to the ER because of fall with right hip pain. X-rays revealed a right hip fracture. Denies any loss of consciousness or hitting his head on fall. Patient states he was walking with trash can when he suddenly tripped and fell. ORIF was performed 12/13/2016 by Dr. [name]. His postoperative course has been uneventful.  Assessment/Plan:   Mechanical fall leading to Closed right hip fracture (HCC) - Status post right hip intertrochanteric factor on 12/13/2016. - Anticoagulation and pain control per orthopedic surgery. - PT evaluation: SNF recommended. Family choosing SNF at time of assessment.  Hx Coronary atherosclerosis Continue metoprolol and aspirin.  Essential hypertension Continue metoprolol.  Cognitive impairment: Continue Aricept.  DVT prophylaxis: anticoagulation per ortho Family Communication:wife Disposition Plan/Barrier to D/C: DC to SNF when bed available Code Status:     Code Status Orders        Start     Ordered   12/12/16 2216  Full code  Continuous     12/12/16 2218    Code Status History    Date Active Date Inactive Code Status Order ID Comments User Context   This patient has a current code status but no historical code status.    Advance Directive Documentation   Flowsheet Row Most Recent Value  Type of Advance Directive  Healthcare Power of Attorney, Living will  Pre-existing out of facility DNR order (yellow form or pink MOST form)  No data  "MOST" Form in Place?  No data      IV Access:    Peripheral IV  Procedures and diagnostic studies:   No results found.  Medical Consultants:    None.  Anti-Infectives:   None  Subjective:   Pain controlled. Resting quietly. Got up with  PT.  Objective:    Vitals:   12/14/16 2107 12/15/16 0555 12/15/16 1214 12/15/16 1400  BP: (!) 112/43 102/83 (!) 121/50 (!) 126/50  Pulse: 66 71 73 67  Resp: '20 18  18  '$ Temp: 100 F (37.8 C) 99.3 F (37.4 C)  99.5 F (37.5 C)  TempSrc: Oral Oral  Oral  SpO2: 94% 100%  99%  Weight:      Height:        Intake/Output Summary (Last 24 hours) at 12/15/16 1730 Last data filed at 12/15/16 0810  Gross per 24 hour  Intake              120 ml  Output              450 ml  Net             -330 ml   Filed Weights   12/13/16 0552 12/13/16 1357  Weight: 81.5 kg (179 lb 10.8 oz) 81.2 kg (179 lb)    Exam: General exam: In no acute distress. Respiratory system: Good air movement and clear to auscultation. Cardiovascular system: S1 & S2 heard, RRR. No JVD, murmurs, rubs, gallops or clicks.  Gastrointestinal system: Abdomen is nondistended, soft and nontender.  Central nervous system: Alert and oriented. No focal neurological deficits. Extremities: RLE compartment soft, cap refill brisk throughout, no motor/sensory deficits.  Skin: No rashes, lesions or ulcers. No evidence of cellulitis Psychiatry: Judgement and insight appear normal. Mood & affect appropriate.  Data Reviewed:   Labs: Basic Metabolic Panel:  Recent Labs Lab 12/12/16 1913 12/13/16 0553 12/14/16 0600 12/15/16 0513  NA 139 140 138 136  K 4.1 4.0 3.8 3.8  CL 105 106 103 101  CO2 '28 28 28 31  '$ GLUCOSE 117* 134* 127* 114*  BUN 22* 21* 17 18  CREATININE 0.91 0.67 0.92 0.87  CALCIUM 8.7* 8.6* 8.5* 8.3*   GFR Estimated Creatinine Clearance: 67.3 mL/min (by C-G formula based on SCr of 0.87 mg/dL).  CBC:  Recent Labs Lab 12/12/16 1913 12/13/16 0553 12/14/16 0600 12/15/16 0513  WBC 6.4 8.5 7.9 7.5  NEUTROABS 5.3  --   --   --   HGB 12.2* 11.2* 10.2* 9.3*  HCT 37.4* 34.2* 31.3* 28.8*  MCV 87.0 88.4 89.2 89.4  PLT 159 156 132* 125*   Sepsis Labs:  Recent Labs Lab 12/12/16 1913 12/13/16 0553  12/14/16 0600 12/15/16 0513  WBC 6.4 8.5 7.9 7.5   Microbiology Recent Results (from the past 240 hour(s))  Urine culture     Status: Abnormal   Collection Time: 12/13/16  1:45 AM  Result Value Ref Range Status   Specimen Description URINE, RANDOM  Final   Special Requests NONE  Final   Culture (A)  Final    <10,000 COLONIES/mL INSIGNIFICANT GROWTH Performed at [facility]    Report Status 12/14/2016 FINAL  Final  Surgical PCR screen     Status: None   Collection Time: 12/13/16  5:21 AM  Result Value Ref Range Status   MRSA, PCR NEGATIVE NEGATIVE Final   Staphylococcus aureus NEGATIVE NEGATIVE Final    Comment:        The Xpert SA Assay (FDA approved for NASAL specimens in patients over 67 years of age), is one component of a comprehensive surveillance program.  Test performance has been validated by [facility] for patients greater than or equal to 87 year old. It is not intended to diagnose infection nor to guide or monitor treatment.      Medications:   . donepezil  10 mg Oral QHS  . enoxaparin (LOVENOX) injection  40 mg Subcutaneous Q24H  . ferrous sulfate  325 mg Oral TID PC  . metoprolol tartrate  25 mg Oral BID  . multivitamin  1 tablet Oral BID   Continuous Infusions:  Time spent: 25 min   LOS: 3 days   Vance Gather, MD [facility] Pager [phone]  *Please refer to [email or web address], password [email or web address] to get updated schedule on who will round on this patient, as hospitalists switch teams weekly. If 7PM-7AM, please contact night-coverage at [email or web address], password [email or web address] for any overnight needs.  12/15/2016, 5:30 PM

## 2016-12-15 NOTE — Clinical Social Work Placement (Signed)
   CLINICAL SOCIAL WORK PLACEMENT  NOTE  Date:  12/15/2016  Patient Details  Name: ALASDAIR KLEVE MRN: [id number] Date of Birth: [birth year]  Clinical Social Work is seeking post-discharge placement for this patient at the [place] level of care (*CSW will initial, date and re-position this form in  chart as items are completed):  Yes   Patient/family provided with [place] Work Department's list of facilities offering this level of care within the geographic area requested by the patient (or if unable, by the patient's family).  Yes   Patient/family informed of their freedom to choose among providers that offer the needed level of care, that participate in Medicare, Medicaid or managed care program needed by the patient, have an available bed and are willing to accept the patient.  Yes   Patient/family informed of [place]'s ownership interest in [facility] and [facility], as well as of the fact that they are under no obligation to receive care at these facilities.  PASRR submitted to EDS on 12/14/16     PASRR number received on 12/14/16     Existing PASRR number confirmed on       FL2 transmitted to all facilities in geographic area requested by pt/family on 12/14/16     FL2 transmitted to all facilities within larger geographic area on       Patient informed that his/her managed care company has contracts with or will negotiate with certain facilities, including the following:        Yes   Patient/family informed of bed offers received.  Patient chooses bed at       Physician recommends and patient chooses bed at      Patient to be transferred to   on  .  Patient to be transferred to facility by       Patient family notified on   of transfer.  Name of family member notified:        PHYSICIAN Please sign FL2, Please prepare priority discharge summary, including medications     Additional Comment:     _______________________________________________ Glendon Axe A 12/15/2016, 12:20 PM

## 2016-12-16 LAB — CBC
HCT: 28.2 % — ABNORMAL LOW (ref 39.0–52.0)
Hemoglobin: 9.2 g/dL — ABNORMAL LOW (ref 13.0–17.0)
MCH: 28.5 pg (ref 26.0–34.0)
MCHC: 32.6 g/dL (ref 30.0–36.0)
MCV: 87.3 fL (ref 78.0–100.0)
PLATELETS: 133 10*3/uL — AB (ref 150–400)
RBC: 3.23 MIL/uL — ABNORMAL LOW (ref 4.22–5.81)
RDW: 13 % (ref 11.5–15.5)
WBC: 7.6 10*3/uL (ref 4.0–10.5)

## 2016-12-16 LAB — BASIC METABOLIC PANEL
Anion gap: 8 (ref 5–15)
BUN: 23 mg/dL — AB (ref 6–20)
CALCIUM: 8.5 mg/dL — AB (ref 8.9–10.3)
CO2: 28 mmol/L (ref 22–32)
CREATININE: 0.92 mg/dL (ref 0.61–1.24)
Chloride: 103 mmol/L (ref 101–111)
Glucose, Bld: 120 mg/dL — ABNORMAL HIGH (ref 65–99)
Potassium: 3.7 mmol/L (ref 3.5–5.1)
SODIUM: 139 mmol/L (ref 135–145)

## 2016-12-16 MED ORDER — FERROUS SULFATE 325 (65 FE) MG PO TABS: 325.0000 mg | ORAL_TABLET | Freq: Three times a day (TID) | ORAL | 0 refills | Status: DC

## 2016-12-16 MED ORDER — POLYETHYLENE GLYCOL 3350 17 GM/SCOOP PO POWD: 17.0000 g | Freq: Two times a day (BID) | ORAL | Status: DC | PRN

## 2016-12-16 MED ORDER — SENNA 8.6 MG PO TABS: 2.0000 | ORAL_TABLET | Freq: Every day | ORAL | Status: DC

## 2016-12-16 NOTE — Progress Notes (Signed)
Pt to be discharged to [facility] place, Reprt given to [facility]. SRP, RN

## 2016-12-16 NOTE — Clinical Social Work Placement (Signed)
Medical Social Worker facilitated patient discharge including contacting patient family and facility to confirm patient discharge plans.  Clinical information faxed to facility and family agreeable with plan.  MSW arranged ambulance transport via [facility] to [facility] and [place] .  RN to call report prior to discharge.  Medical Social Worker will sign off for now as social work intervention is no longer needed. Please consult [place] again if new need arises.   CLINICAL SOCIAL WORK PLACEMENT  NOTE  Date:  12/16/2016  Patient Details  Name: [name] MRN: [id number] Date of Birth: 04-03-1930  Clinical Social Work is seeking post-discharge placement for this patient at the [place] level of care (*CSW will initial, date and re-position this form in  chart as items are completed):  Yes   Patient/family provided with [place] Work Department's list of facilities offering this level of care within the geographic area requested by the patient (or if unable, by the patient's family).  Yes   Patient/family informed of their freedom to choose among providers that offer the needed level of care, that participate in Medicare, Medicaid or managed care program needed by the patient, have an available bed and are willing to accept the patient.  Yes   Patient/family informed of [place]'s ownership interest in [facility] and [facility], as well as of the fact that they are under no obligation to receive care at these facilities.  PASRR submitted to EDS on 12/14/16     PASRR number received on 12/14/16     Existing PASRR number confirmed on       FL2 transmitted to all facilities in geographic area requested by pt/family on 12/14/16     FL2 transmitted to all facilities within larger geographic area on       Patient informed that his/her managed care company has contracts with or will negotiate with certain facilities, including the following:         Yes   Patient/family informed of bed offers received.  Patient chooses bed at  [facility] and [place] )     Physician recommends and patient chooses bed at      Patient to be transferred to  [facility] [place] ) on 12/16/16.  Patient to be transferred to facility by  [facility] )     Patient family notified on 12/16/16 of transfer.  Name of family member notified:   (Pt's wife, Jeanett Schlein )     PHYSICIAN Please sign FL2, Please prepare priority discharge summary, including medications     Additional Comment:    _______________________________________________ Glendon Axe A 12/16/2016, 11:55 AM

## 2016-12-16 NOTE — Discharge Summary (Signed)
Physician Discharge Summary  [name]:[id number] DOB: 23-Mar-1930 DOA: 12/12/2016  PCP: Derrick Dawson, MD  Admit date: 12/12/2016 Discharge date: 12/16/2016  Admitted From: Home Disposition: SNF   Recommendations for Outpatient Follow-up:  1. Follow up with PCP in 1-2 weeks 2. Please obtain BMP/CBC in one week 3. Follow up with orthopedics in 2 weeks. Further orthopedics recs as below.   Home Health: N/A Equipment/Devices: Per SNF Discharge Condition: Stable CODE STATUS: Full Diet recommendation: Heart healthy  Brief/Interim Summary: [name] is an 83 y.o. male malewith history of CAD status post stenting presents to the ER because of fall with right hip pain. X-rays revealed a right hip fracture. Denies any loss of consciousness or hitting his head on fall. Patient states he was walking with trash can when he suddenly tripped and fell. ORIF was performed 12/13/2016 by Dr. [name]. His postoperative course has been uneventful.  Discharge Diagnoses:  Principal Problem:   Closed right hip fracture [facility]) Active Problems:   Coronary atherosclerosis   PROSTATE CANCER, HX OF   Essential hypertension   Amnestic MCI (mild cognitive impairment with memory loss)   Mechanical fall leading to Closed right hip fracture (HCC) - Status post right hip intertrochanteric factor on 12/13/2016. - Orthopedics recommendations:  Lovenox 40 qd for 14 days for anticoagulation (Rx written by ortho), unless other medically indicated.  Norco for pain management (Rx written by ortho).  MiraLax and senna for constipation.  Iron 325 mg tid for 2-3 weeks.  PWB 50% on the right leg.  Dressing to remain in place for 4 days, then replace with gauze and tape.  Keep the area clean and dry.  Follow up in 2 weeks at [facility].  Hx Coronary atherosclerosis Continue metoprolol and aspirin.  Essential hypertension Continue metoprolol.  Cognitive impairment: Continue  Aricept.  Discharge Instructions Discharge Instructions    Partial weight bearing    Complete by:  As directed    % Body Weight:  50   Laterality:  right   Extremity:  Lower     Allergies as of 12/16/2016      Reactions   Pravastatin    ? Memory deficits improved when went off med   Aspirin    Daily use causes bleeding in the bladder.  Can take occasionally   Meperidine Hcl    REACTION: combative      Medication List    STOP taking these medications   HYDROcodone-acetaminophen 10-325 MG tablet Commonly known as:  NORCO Replaced by:  HYDROcodone-acetaminophen 5-325 MG tablet     TAKE these medications   amoxicillin 500 MG capsule Commonly known as:  AMOXIL Take 500 mg by mouth daily as needed (dental).   clobetasol 0.05 % external solution Commonly known as:  TEMOVATE APPLY TO AFFECTED AREA TWICE A DAY ON SCALP AS NEEDED (NOT TO FACE)   donepezil 10 MG tablet Commonly known as:  ARICEPT Take 1 tablet (10 mg total) by mouth at bedtime.   enoxaparin 40 MG/0.4ML injection Commonly known as:  LOVENOX Inject 0.4 mLs (40 mg total) into the skin daily.   ferrous sulfate 325 (65 FE) MG tablet Take 1 tablet (325 mg total) by mouth 3 (three) times daily after meals.   HYDROcodone-acetaminophen 5-325 MG tablet Commonly known as:  NORCO/VICODIN Take 1-2 tablets by mouth every 6 (six) hours as needed for moderate pain. Replaces:  HYDROcodone-acetaminophen 10-325 MG tablet   metoprolol tartrate 25 MG tablet Commonly known as:  LOPRESSOR TAKE  1 TABLET TWICE A DAY What changed:  See the new instructions.   OCUVITE PRESERVISION Tabs Take 1 tablet by mouth 2 (two) times daily.   omeprazole 20 MG tablet Commonly known as:  PRILOSEC OTC Take 20 mg by mouth daily as needed (reflux).   OVER THE COUNTER MEDICATION Take 1 tablet by mouth 2 (two) times daily. Calcium Citrate '500mg'$  w/ Vitamin D '800mg'$    polyethylene glycol powder powder Commonly known as:   GLYCOLAX/MIRALAX Take 17 g by mouth 2 (two) times daily as needed for moderate constipation. Take as needed to produce 1 normal bowel movement per day.   senna 8.6 MG Tabs tablet Commonly known as:  SENOKOT Take 2 tablets (17.2 mg total) by mouth daily.   triamcinolone lotion 0.1 % Commonly known as:  KENALOG APPLY 1 APPLICATION ON THE SKIN DAILY, APPLY TO SCALP TWO TIMES AS NEEDED FOR FLARES      Follow-up Information    Derrick Dawson, MD Follow up.   Specialties:  Internal Medicine, Pediatrics Contact information: [place] [address] Vado D, MD Follow up in 2 week(s).   Specialty:  Orthopedic Surgery Contact information: [address] [place]  [address] [phone]          Allergies  Allergen Reactions  . Pravastatin     ? Memory deficits improved when went off med  . Aspirin     Daily use causes bleeding in the bladder.  Can take occasionally  . Meperidine Hcl     REACTION: combative    Consultations:  Orthopedics, Dr. [name]  Procedures/Studies: Dg Chest 1 View  Result Date: 12/12/2016 CLINICAL DATA:  Fall, fractured hip EXAM: CHEST 1 VIEW COMPARISON:  10/02/2015 FINDINGS: Stable calcified right mid lung zone nodule. Coarse interstitial opacities, likely chronic. No acute consolidation or effusion. Stable cardiomediastinal silhouette with tortuous calcified aorta. Coronary calcification or possible stent. No pneumothorax. IMPRESSION: 1. Probable coarse interstitial opacities at the bilateral lung bases. No acute infiltrate or edema 2. Atherosclerosis of the aorta Electronically Signed   By: Donavan Foil M.D.   On: 12/12/2016 20:22   Dg C-arm 1-60 Min-no Report  Result Date: 12/13/2016 There is no Radiologist interpretation  for this exam.  Dg Hip Unilat With Pelvis 2-3 Views Right  Result Date: 12/13/2016 CLINICAL DATA:  IM nail RIGHT hip EXAM: DG HIP (WITH OR WITHOUT PELVIS)  2-3V RIGHT COMPARISON:  12/12/2016 FLUOROSCOPY TIME:  0 minutes 54.3 seconds Dose:  5.0 mGy Images obtained:  2 FINDINGS: Osseous demineralization. IM nail with compression screw placed across a reduced fracture of the intertrochanteric region of the RIGHT femur. No dislocation seen. Distal extent of IM nail not imaged. IMPRESSION: Post nailing of intertrochanteric fracture RIGHT femur. Electronically Signed   By: Lavonia Dana M.D.   On: 12/13/2016 17:33   Dg Hip Unilat  With Pelvis 2-3 Views Right  Result Date: 12/12/2016 CLINICAL DATA:  Fall taking out the garbage EXAM: DG HIP (WITH OR WITHOUT PELVIS) 2-3V RIGHT COMPARISON:  None. FINDINGS: Surgical clips within the pelvis. SI joints grossly symmetric. Left femoral head projects in joint. Comminuted intertrochanteric fracture of the right femur with superior migration of the shaft of the femur. Right femoral head projects in joint. Suspect old right superior and inferior pubic rami fractures. IMPRESSION: 1. Comminuted intertrochanteric fracture of the proximal right femur. Right femoral head projects in joint 2. Suspect old fractures of the right superior inferior  pubic rami Electronically Signed   By: Donavan Foil M.D.   On: 12/12/2016 20:20    Subjective: Pt reports pain controlled. No chest pain or dyspnea. Family reports mild delirium without significant agitation overnight, mental status overall improving to baseline.   Discharge Exam: Vitals:   12/15/16 2148 12/16/16 0936  BP: (!) 136/52 (!) 113/47  Pulse: 68 66  Resp: 17   Temp: 98.6 F (37 C)    General exam: In no acute distress. Respiratory system: Good air movement and clear to auscultation. Cardiovascular system: S1 & S2 heard, RRR. No JVD, murmurs, rubs, gallops or clicks.  Gastrointestinal system: Abdomen is nondistended, soft and nontender.  Central nervous system: Alert and oriented. No focal neurological deficits. Extremities: RLE compartment soft, cap refill brisk  throughout, no motor/sensory deficits.  Skin: No rashes, lesions or ulcers. No evidence of cellulitis Psychiatry: Judgement and insight appear normal. Mood & affect appropriate.   Labs: Basic Metabolic Panel:  Recent Labs Lab 12/12/16 1913 12/13/16 0553 12/14/16 0600 12/15/16 0513 12/16/16 0453  NA 139 140 138 136 139  K 4.1 4.0 3.8 3.8 3.7  CL 105 106 103 101 103  CO2 '28 28 28 31 28  '$ GLUCOSE 117* 134* 127* 114* 120*  BUN 22* 21* 17 18 23*  CREATININE 0.91 0.67 0.92 0.87 0.92  CALCIUM 8.7* 8.6* 8.5* 8.3* 8.5*   CBC:  Recent Labs Lab 12/12/16 1913 12/13/16 0553 12/14/16 0600 12/15/16 0513 12/16/16 0453  WBC 6.4 8.5 7.9 7.5 7.6  NEUTROABS 5.3  --   --   --   --   HGB 12.2* 11.2* 10.2* 9.3* 9.2*  HCT 37.4* 34.2* 31.3* 28.8* 28.2*  MCV 87.0 88.4 89.2 89.4 87.3  PLT 159 156 132* 125* 133*   Urinalysis    Component Value Date/Time   COLORURINE YELLOW 12/13/2016 0145   APPEARANCEUR CLEAR 12/13/2016 0145   LABSPEC 1.023 12/13/2016 0145   PHURINE 5.0 12/13/2016 0145   GLUCOSEU NEGATIVE 12/13/2016 0145   HGBUR NEGATIVE 12/13/2016 0145   BILIRUBINUR NEGATIVE 12/13/2016 0145   BILIRUBINUR neg 11/09/2016 1544   KETONESUR 20 (A) 12/13/2016 0145   PROTEINUR NEGATIVE 12/13/2016 0145   UROBILINOGEN 0.2 11/09/2016 1544   UROBILINOGEN 0.2 10/28/2013 1409   NITRITE NEGATIVE 12/13/2016 0145   LEUKOCYTESUR NEGATIVE 12/13/2016 0145   Microbiology Recent Results (from the past 240 hour(s))  Urine culture     Status: Abnormal   Collection Time: 12/13/16  1:45 AM  Result Value Ref Range Status   Specimen Description URINE, RANDOM  Final   Special Requests NONE  Final   Culture (A)  Final    <10,000 COLONIES/mL INSIGNIFICANT GROWTH Performed at [facility]    Report Status 12/14/2016 FINAL  Final  Surgical PCR screen     Status: None   Collection Time: 12/13/16  5:21 AM  Result Value Ref Range Status   MRSA, PCR NEGATIVE NEGATIVE Final   Staphylococcus aureus  NEGATIVE NEGATIVE Final    Comment:        The Xpert SA Assay (FDA approved for NASAL specimens in patients over 25 years of age), is one component of a comprehensive surveillance program.  Test performance has been validated by [facility] for patients greater than or equal to 19 year old. It is not intended to diagnose infection nor to guide or monitor treatment.    Time coordinating discharge: Over 30 minutes  Vance Gather, MD  [facility] 12/16/2016, 11:45 AM Pager [phone]

## 2016-12-16 NOTE — Progress Notes (Signed)
Physical Therapy Treatment Patient Details Name: [name] MRN: [id number] DOB: 11/12/1930 Today's Date: 12/16/2016    History of Present Illness 83 yo male fell at home on 12/13/15 and sustained a right intertrochanteric hip fracture. S/P ORIF IM nail 12/13/16.  H/O CAD, recent L axillary node biopsy    PT Comments    Pt assisted to [facility] and then able to take a few steps over to recliner today.  Pt pleasant and cooperative.  Plans to d/c to SNF today.  Follow Up Recommendations  SNF;Supervision/Assistance - 24 hour     Equipment Recommendations  None recommended by PT    Recommendations for Other Services       Precautions / Restrictions Precautions Precautions: Fall Restrictions RLE Weight Bearing: Partial weight bearing RLE Partial Weight Bearing Percentage or Pounds: 50%    Mobility  Bed Mobility Overal bed mobility: Needs Assistance Bed Mobility: Supine to Sit     Supine to sit: HOB elevated;+2 for safety/equipment;+2 for physical assistance;Mod assist     General bed mobility comments: assist with legs and trunk, utilized bed pad, the patient did self assist to scoot to bed edge.  Transfers Overall transfer level: Needs assistance Equipment used: Rolling walker (2 wheeled) Transfers: Sit to/from [facility] Sit to Stand: +2 physical assistance;+2 safety/equipment;From elevated surface;Mod assist         General transfer comment: verbal cues for technique including hand placement, assist to rise, steady and control descent, pivoted to [facility] and performed a few steps over to recliner with step by step cues  Ambulation/Gait                 Stairs            Wheelchair Mobility    Modified Rankin (Stroke Patients Only)       Balance                                    Cognition Arousal/Alertness: Awake/alert Behavior During Therapy: WFL for tasks assessed/performed Overall Cognitive Status: Within  Functional Limits for tasks assessed                      Exercises      General Comments        Pertinent Vitals/Pain Pain Assessment: Faces Faces Pain Scale: Hurts little more Pain Location: R hip with mobility Pain Descriptors / Indicators: Guarding;Grimacing Pain Intervention(s): Limited activity within patient's tolerance;Monitored during session;Repositioned;Premedicated before session    Home Living                      Prior Function            PT Goals (current goals can now be found in the care plan section) Progress towards PT goals: Progressing toward goals    Frequency    Min 3X/week      PT Plan Current plan remains appropriate    Co-evaluation             End of Session Equipment Utilized During Treatment: Gait belt Activity Tolerance: Patient tolerated treatment well Patient left: in chair;with call bell/phone within reach;with chair alarm set;with family/visitor present     Time: 6962-9528 PT Time Calculation (min) (ACUTE ONLY): 26 min  Charges:  $Therapeutic Activity: 23-37 mins  G Codes:      Jonica Bickhart,KATHrine E Jan 02, 2017, 1:27 PM Carmelia Bake, PT, DPT 2017-01-02 Pager: [phone]

## 2016-12-19 ENCOUNTER — Encounter: Payer: Self-pay | Admitting: Adult Health

## 2016-12-19 ENCOUNTER — Non-Acute Institutional Stay (SKILLED_NURSING_FACILITY): Payer: Medicare Other | Admitting: Adult Health

## 2016-12-19 ENCOUNTER — Telehealth: Payer: Self-pay | Admitting: Internal Medicine

## 2016-12-19 DIAGNOSIS — S72001S Fracture of unspecified part of neck of right femur, sequela: Secondary | ICD-10-CM | POA: Diagnosis not present

## 2016-12-19 DIAGNOSIS — F0280 Dementia in other diseases classified elsewhere without behavioral disturbance: Secondary | ICD-10-CM

## 2016-12-19 DIAGNOSIS — D62 Acute posthemorrhagic anemia: Secondary | ICD-10-CM | POA: Diagnosis not present

## 2016-12-19 DIAGNOSIS — G3183 Dementia with Lewy bodies: Secondary | ICD-10-CM

## 2016-12-19 DIAGNOSIS — K5904 Chronic idiopathic constipation: Secondary | ICD-10-CM | POA: Diagnosis not present

## 2016-12-19 DIAGNOSIS — R2681 Unsteadiness on feet: Secondary | ICD-10-CM

## 2016-12-19 DIAGNOSIS — K219 Gastro-esophageal reflux disease without esophagitis: Secondary | ICD-10-CM | POA: Diagnosis not present

## 2016-12-19 DIAGNOSIS — K5901 Slow transit constipation: Secondary | ICD-10-CM | POA: Diagnosis not present

## 2016-12-19 NOTE — Progress Notes (Signed)
DATE:  12/19/2016 MRN:  [id number]  BIRTHDAY: 03-26-1930  Facility:  Nursing Home Location:  [place] Room Number: 818-E  LEVEL OF CARE:  SNF [phone])  Contact Information    Name Relation Home Work [place] [phone]  [phone]       Code Status History    Date Active Date Inactive Code Status Order ID Comments User Context   12/12/2016 10:18 PM 12/16/2016  4:40 PM Full Code [id number]  Rise Patience, MD ED       Chief Complaint  Patient presents with  . Hospitalization Follow-up    HISTORY OF PRESENT ILLNESS:  This is an 64-YO male admitted to [facility] on 12/16/2016 for short-term rehabilitation following an admission at [facility] 12/12/2016-12/16/2016 S/P mechanical fall sustaining right hip pain.  X-ray revealed a right hip fracture.  He underwent an ORIF of right intertrochanteric femur fracture by Dr. [name] on 12/13/2016.    He was seen in the room today with wife at bedside.  PAST MEDICAL HISTORY:  Past Medical History:  Diagnosis Date  . Adenomatous colon polyp   . Anemia   . Arthritis   . CAD (coronary artery disease)    s/p Taxus DES to prox and mid LAD 06/2003;  LHC  1/07: LM 205, LAD stents ok., oD2 70% jailed (tx conservatively), mid to dist RCA 40-50%, EF 40-45%;  echo 2/07: EF 55-65%  . COPD (chronic obstructive pulmonary disease) ([place])   . Dermatitis   . Diverticulosis of colon   . GERD (gastroesophageal reflux disease)   . Hemorrhoids   . Hiatal hernia   . History of prostate cancer   . History of renal calculi   . Hyperglycemia   . Hyperlipidemia   . Hypothyroidism   . Migraines   . Osteoarthritis   . Prostate cancer ([place])   . Transfusion history      CURRENT MEDICATIONS: Reviewed  Patient's Medications  New Prescriptions   No medications on file  Previous Medications   AMOXICILLIN (AMOXIL) 500 MG CAPSULE    Take 500 mg by mouth daily as needed (dental).    CLOBETASOL (TEMOVATE) 0.05 % EXTERNAL SOLUTION    APPLY TO AFFECTED AREA TWICE A DAY ON SCALP AS NEEDED (NOT TO FACE)   DONEPEZIL (ARICEPT) 10 MG TABLET    Take 1 tablet (10 mg total) by mouth at bedtime.   ENOXAPARIN (LOVENOX) 40 MG/0.4ML INJECTION    Inject 0.4 mLs (40 mg total) into the skin daily.   FERROUS SULFATE 325 (65 FE) MG TABLET    Take 1 tablet (325 mg total) by mouth 3 (three) times daily after meals.   HYDROCODONE-ACETAMINOPHEN (NORCO/VICODIN) 5-325 MG TABLET    Take 1-2 tablets by mouth every 6 (six) hours as needed for moderate pain.   METOPROLOL TARTRATE (LOPRESSOR) 25 MG TABLET    TAKE 1 TABLET TWICE A DAY   MULTIPLE VITAMINS-MINERALS (OCUVITE PRESERVISION) TABS    Take 1 tablet by mouth 2 (two) times daily.     OMEPRAZOLE (PRILOSEC OTC) 20 MG TABLET    Take 20 mg by mouth daily as needed (reflux).    OVER THE COUNTER MEDICATION    Take 1 tablet by mouth 2 (two) times daily. Calcium Citrate '500mg'$  w/ Vitamin D '800mg'$    POLYETHYLENE GLYCOL POWDER (GLYCOLAX/MIRALAX) POWDER    Take 17 g by mouth 2 (two) times daily as needed for moderate constipation. Take  as needed to produce 1 normal bowel movement per day.   SENNA (SENOKOT) 8.6 MG TABS TABLET    Take 2 tablets (17.2 mg total) by mouth daily.   TRIAMCINOLONE LOTION (KENALOG) 0.1 %    APPLY 1 APPLICATION ON THE SKIN DAILY, APPLY TO SCALP TWO TIMES AS NEEDED FOR FLARES  Modified Medications   No medications on file  Discontinued Medications   No medications on file     Allergies  Allergen Reactions  . Pravastatin     ? Memory deficits improved when went off med  . Aspirin     Daily use causes bleeding in the bladder.  Can take occasionally  . Meperidine Hcl     REACTION: combative     REVIEW OF SYSTEMS:  GENERAL: no change in appetite, no fatigue, no weight changes, no fever, chills or weakness EYES: Denies change in vision, dry eyes, eye pain, itching or discharge EARS: Denies change in hearing, ringing in ears, or  earache NOSE: Denies nasal congestion or epistaxis MOUTH and THROAT: Denies oral discomfort, gingival pain or bleeding, pain from teeth or hoarseness   RESPIRATORY: no cough, SOB, DOE, wheezing, hemoptysis CARDIAC: no chest pain, edema or palpitations GI: no abdominal pain, diarrhea, constipation, heart burn, nausea or vomiting GU: Denies dysuria, frequency, hematuria, incontinence, or discharge PSYCHIATRIC: Denies feeling of depression or anxiety. No report of hallucinations, insomnia, paranoia, or agitation    PHYSICAL EXAMINATION  GENERAL APPEARANCE: Well nourished. In no acute distress. Normal body habitus SKIN:  Right hip surgical incision has staples and convert with dressing, dry and no erythema and left axilla with surgical incision, dry and no erythema HEAD: Normal in size and contour. No evidence of trauma EYES: Lids open and close normally. No blepharitis, entropion or ectropion. PERRL. Conjunctivae are clear and sclerae are white. Lenses are without opacity EARS: Pinnae are normal. Patient hears normal voice tunes of the examiner MOUTH and THROAT: Lips are without lesions. Oral mucosa is moist and without lesions. Tongue is normal in shape, size, and color and without lesions NECK: supple, trachea midline, no neck masses, no thyroid tenderness, no thyromegaly LYMPHATICS: no LAN in the neck, no supraclavicular LAN RESPIRATORY: breathing is even & unlabored, BS CTAB CARDIAC: RRR, no murmur,no extra heart sounds, no edema GI: abdomen soft, normal BS, no masses, no tenderness, no hepatomegaly, no splenomegaly EXTREMITIES:  Able to move 4 extremities PSYCHIATRIC: Alert and oriented X 3. Affect and behavior are appropriate  LABS/RADIOLOGY: Labs reviewed: Basic Metabolic Panel:  Recent Labs  12/14/16 0600 12/15/16 0513 12/16/16 0453  NA 138 136 139  K 3.8 3.8 3.7  CL 103 101 103  CO2 '28 31 28  '$ GLUCOSE 127* 114* 120*  BUN 17 18 23*  CREATININE 0.92 0.87 0.92  CALCIUM  8.5* 8.3* 8.5*   Liver Function Tests:  Recent Labs  09/14/16 0837  AST 12  ALT 9  ALKPHOS 94  BILITOT 0.8  PROT 6.1  ALBUMIN 3.8   CBC:  Recent Labs  09/14/16 0837 12/12/16 1913  12/14/16 0600 12/15/16 0513 12/16/16 0453  WBC 5.2 6.4  < > 7.9 7.5 7.6  NEUTROABS 3.2 5.3  --   --   --   --   HGB 14.1 12.2*  < > 10.2* 9.3* 9.2*  HCT 42.0 37.4*  < > 31.3* 28.8* 28.2*  MCV 88.5 87.0  < > 89.2 89.4 87.3  PLT 181.0 159  < > 132* 125* 133*  < > = values  in this interval not displayed. Lipid Panel:  Recent Labs  09/14/16 0837  HDL 47.50    Dg Chest 1 View  Result Date: 12/12/2016 CLINICAL DATA:  Fall, fractured hip EXAM: CHEST 1 VIEW COMPARISON:  10/02/2015 FINDINGS: Stable calcified right mid lung zone nodule. Coarse interstitial opacities, likely chronic. No acute consolidation or effusion. Stable cardiomediastinal silhouette with tortuous calcified aorta. Coronary calcification or possible stent. No pneumothorax. IMPRESSION: 1. Probable coarse interstitial opacities at the bilateral lung bases. No acute infiltrate or edema 2. Atherosclerosis of the aorta Electronically Signed   By: Donavan Foil M.D.   On: 12/12/2016 20:22   Dg C-arm 1-60 Min-no Report  Result Date: 12/13/2016 There is no Radiologist interpretation  for this exam.  Dg Hip Unilat With Pelvis 2-3 Views Right  Result Date: 12/13/2016 CLINICAL DATA:  IM nail RIGHT hip EXAM: DG HIP (WITH OR WITHOUT PELVIS) 2-3V RIGHT COMPARISON:  12/12/2016 FLUOROSCOPY TIME:  0 minutes 54.3 seconds Dose:  5.0 mGy Images obtained:  2 FINDINGS: Osseous demineralization. IM nail with compression screw placed across a reduced fracture of the intertrochanteric region of the RIGHT femur. No dislocation seen. Distal extent of IM nail not imaged. IMPRESSION: Post nailing of intertrochanteric fracture RIGHT femur. Electronically Signed   By: Lavonia Dana M.D.   On: 12/13/2016 17:33   Dg Hip Unilat  With Pelvis 2-3 Views Right  Result  Date: 12/12/2016 CLINICAL DATA:  Fall taking out the garbage EXAM: DG HIP (WITH OR WITHOUT PELVIS) 2-3V RIGHT COMPARISON:  None. FINDINGS: Surgical clips within the pelvis. SI joints grossly symmetric. Left femoral head projects in joint. Comminuted intertrochanteric fracture of the right femur with superior migration of the shaft of the femur. Right femoral head projects in joint. Suspect old right superior and inferior pubic rami fractures. IMPRESSION: 1. Comminuted intertrochanteric fracture of the proximal right femur. Right femoral head projects in joint 2. Suspect old fractures of the right superior inferior pubic rami Electronically Signed   By: Donavan Foil M.D.   On: 12/12/2016 20:20    ASSESSMENT/PLAN:  Unsteady gait - for rehabilitation, PT and OT, for therapeutic strengthening exercises; fall precautions  Closed right hip fracture S/P ORIF - for rehabilitation, PT and OT, for therapeutic strengthening exercises; continue Lovenox 40 mg subcutaneous daily for DVT prophylaxis; Norco 5/325 mg 1-2 tabs by mouth every 6 hours when necessary for pain; follow-up with orthopedic surgeon in 2 weeks  Anemia, acute blood loss - continue ferrous sulfate 325 mg 1 tab by mouth twice a day;  check CBC Lab Results  Component Value Date   HGB 9.2 (L) 12/16/2016   GERD - continue Prilosec 20 mg 1 tab by mouth daily when necessary  Hypertension - continue metoprolol 25 mg 1 tab by mouth twice a day; check BMP  Dementia - continue Aricept 10 mg 1 tab by mouth daily at bedtime  Constipation - continue MiraLAX 17 g by mouth twice a day when necessary, Senokot 8.6 mg 1 tab by mouth daily at bedtime     Goals of care:  Short-term rehabilitation   Seraphim Trow C. Martorell - NP [employer] [phone]

## 2016-12-19 NOTE — Telephone Encounter (Signed)
Misty please contact Jeanett Schlein and get more information . I may not  able to call for   Until late tonight or tomorrow

## 2016-12-19 NOTE — Telephone Encounter (Signed)
Pt is requesting dr Regis Bill to return her call she has some questions concerning her husband medication and health

## 2016-12-19 NOTE — Telephone Encounter (Signed)
Left a message for a return call.

## 2016-12-20 ENCOUNTER — Encounter: Payer: Self-pay | Admitting: Internal Medicine

## 2016-12-20 ENCOUNTER — Non-Acute Institutional Stay (SKILLED_NURSING_FACILITY): Payer: Medicare Other | Admitting: Internal Medicine

## 2016-12-20 DIAGNOSIS — K5900 Constipation, unspecified: Secondary | ICD-10-CM | POA: Diagnosis not present

## 2016-12-20 DIAGNOSIS — D696 Thrombocytopenia, unspecified: Secondary | ICD-10-CM

## 2016-12-20 DIAGNOSIS — R4189 Other symptoms and signs involving cognitive functions and awareness: Secondary | ICD-10-CM

## 2016-12-20 DIAGNOSIS — K219 Gastro-esophageal reflux disease without esophagitis: Secondary | ICD-10-CM

## 2016-12-20 DIAGNOSIS — D62 Acute posthemorrhagic anemia: Secondary | ICD-10-CM

## 2016-12-20 DIAGNOSIS — I10 Essential (primary) hypertension: Secondary | ICD-10-CM | POA: Diagnosis not present

## 2016-12-20 DIAGNOSIS — S72001S Fracture of unspecified part of neck of right femur, sequela: Secondary | ICD-10-CM

## 2016-12-20 DIAGNOSIS — R2681 Unsteadiness on feet: Secondary | ICD-10-CM

## 2016-12-20 LAB — BASIC METABOLIC PANEL
BUN: 24 mg/dL — AB (ref 4–21)
Creatinine: 0.8 mg/dL (ref 0.6–1.3)
GLUCOSE: 105 mg/dL
POTASSIUM: 4.1 mmol/L (ref 3.4–5.3)
SODIUM: 145 mmol/L (ref 137–147)

## 2016-12-20 LAB — CBC AND DIFFERENTIAL
HEMATOCRIT: 28 % — AB (ref 41–53)
Hemoglobin: 8.9 g/dL — AB (ref 13.5–17.5)
NEUTROS ABS: 4 /uL
Platelets: 238 10*3/uL (ref 150–399)
WBC: 6.2 10*3/mL

## 2016-12-20 NOTE — Telephone Encounter (Signed)
Pt stated her  phone was off the hook and please call her back

## 2016-12-20 NOTE — Progress Notes (Signed)
LOCATION: [place]  PCP: Lottie Dawson, MD   Code Status: Full Code  Goals of care: Advanced Directive information Advanced Directives 12/12/2016  Does Patient Have a Medical Advance Directive? Yes  Type of Paramedic of [place];Living will  Does patient want to make changes to medical advance directive? No - Patient declined  Copy of [place] in Chart? No - copy requested       Extended Emergency Contact Information Primary Emergency Contact: Swickard,Jeanette B Address: [address]          [place], [place] [address] [place] Phone: [phone] Mobile Phone: [phone] Relation: Spouse Secondary Emergency Contact: [place] Mobile Phone: [phone] Relation: Daughter   Allergies  Allergen Reactions  . Pravastatin     ? Memory deficits improved when went off med  . Aspirin     Daily use causes bleeding in the bladder.  Can take occasionally  . Meperidine Hcl     REACTION: combative    Chief Complaint  Patient presents with  . New Admit To SNF    New Admission Visit      HPI:  Patient is a 83 y.o. male seen today for short term rehabilitation post hospital admission from 12/12/2016-12/16/2016 with closed right hip fracture. He underwent surgical repair. He is seen in his room today. He has medical history of coronary artery disease and hypertension. His wife is present at bedside.  Review of Systems:  Constitutional: Negative for fever, chills, diaphoresis.  HENT: Negative for headache, congestion, nasal discharge, sore throat, difficulty swallowing.   Eyes: Negative for blurred vision, double vision and discharge. Wears glasses.  Respiratory: Negative for cough, shortness of breath and wheezing.   Cardiovascular: Negative for chest pain, palpitations, leg swelling.  Gastrointestinal: Negative for heartburn, nausea, vomiting, abdominal pain,  loss of appetite, melena, diarrhea and constipation. Last bowel movement was this morning..  Genitourinary: Negative for dysuria and flank pain.  Musculoskeletal: Negative for back pain, fall in the facility.  positive for right hip pain and pain medication has been helpful. Skin: Negative for itching, rash.  Neurological: Negative for dizziness. Psychiatric/Behavioral: Negative for depression   Past Medical History:  Diagnosis Date  . Adenomatous colon polyp   . Anemia   . Arthritis   . CAD (coronary artery disease)    s/p Taxus DES to prox and mid LAD 06/2003;  LHC  1/07: LM 205, LAD stents ok., oD2 70% jailed (tx conservatively), mid to dist RCA 40-50%, EF 40-45%;  echo 2/07: EF 55-65%  . COPD (chronic obstructive pulmonary disease) ([place])   . Dermatitis   . Diverticulosis of colon   . GERD (gastroesophageal reflux disease)   . Hemorrhoids   . Hiatal hernia   . History of prostate cancer   . History of renal calculi   . Hyperglycemia   . Hyperlipidemia   . Hypothyroidism   . Migraines   . Osteoarthritis   . Prostate cancer ([place])   . Transfusion history    Past Surgical History:  Procedure Laterality Date  . APPENDECTOMY    . broken knee cap    . broken wrist    . COLONOSCOPY  2005  . CORONARY STENT PLACEMENT     2004  . FEMUR IM NAIL Right 12/13/2016   Procedure: INTRAMEDULLARY (IM) NAIL FEMORAL;  Surgeon: Paralee Cancel, MD;  Location: [facility];  Service: Orthopedics;  Laterality: Right;  . left  knee replacement    . LITHOTRIPSY  1996  . PROSTATECTOMY    . radiation for prostate    . RETINAL DETACHMENT SURGERY    . right knee replacement     x 2  . TOTAL KNEE ARTHROPLASTY  2001/2002   Social History:   reports that he has quit smoking. He has never used smokeless tobacco. He reports that he does not drink alcohol or use drugs.  Family History  Problem Relation Age of Onset  . Hypertension Other   . Arthritis Other   . Cancer Other   . Rheum arthritis Daughter      Medications: Allergies as of 12/20/2016      Reactions   Pravastatin    ? Memory deficits improved when went off med   Aspirin    Daily use causes bleeding in the bladder.  Can take occasionally   Meperidine Hcl    REACTION: combative      Medication List       Accurate as of 12/20/16  3:11 PM. Always use your most recent med list.          clobetasol 0.05 % external solution Commonly known as:  TEMOVATE APPLY TO AFFECTED AREA TWICE A DAY ON SCALP AS NEEDED (NOT TO FACE)   donepezil 10 MG tablet Commonly known as:  ARICEPT Take 1 tablet (10 mg total) by mouth at bedtime.   enoxaparin 40 MG/0.4ML injection Commonly known as:  LOVENOX Inject 0.4 mLs (40 mg total) into the skin daily.   ferrous sulfate 325 (65 FE) MG EC tablet Take 325 mg by mouth 2 (two) times daily.   HYDROcodone-acetaminophen 5-325 MG tablet Commonly known as:  NORCO/VICODIN Take 1-2 tablets by mouth every 6 (six) hours as needed for moderate pain.   metoprolol tartrate 25 MG tablet Commonly known as:  LOPRESSOR TAKE 1 TABLET TWICE A DAY   OCUVITE PRESERVISION Tabs Take 1 tablet by mouth 2 (two) times daily.   omeprazole 20 MG tablet Commonly known as:  PRILOSEC OTC Take 20 mg by mouth daily as needed (reflux).   OVER THE COUNTER MEDICATION Take 1 tablet by mouth 2 (two) times daily. Calcium Citrate '500mg'$  w/ Vitamin D '800mg'$    polyethylene glycol powder powder Commonly known as:  GLYCOLAX/MIRALAX Take 17 g by mouth 2 (two) times daily as needed for moderate constipation. Take as needed to produce 1 normal bowel movement per day.   senna 8.6 MG Tabs tablet Commonly known as:  SENOKOT Take 2 tablets (17.2 mg total) by mouth daily.   triamcinolone lotion 0.1 % Commonly known as:  KENALOG APPLY 1 APPLICATION ON THE SKIN DAILY, APPLY TO SCALP TWO TIMES AS NEEDED FOR FLARES       Immunizations: Immunization History  Administered Date(s) Administered  . Pneumococcal Conjugate-13  04/30/2014  . Td 03/06/2007  . Tdap 12/12/2016  . Zoster 02/05/2008     Physical Exam: Vitals:   12/20/16 1506  BP: (!) 121/57  Pulse: 60  Resp: 16  Temp: 98.1 F (36.7 C)  TempSrc: Oral  SpO2: 99%  Weight: 179 lb (81.2 kg)  Height: '5\' 11"'$  (1.803 m)   Body mass index is 24.97 kg/m.  General- elderly male, well built, in no acute distress Head- normocephalic, atraumatic Nose- no maxillary or frontal sinus tenderness, no nasal discharge Throat- moist mucus membrane  Eyes- PERRLA, EOMI, no pallor, no icterus, no discharge, normal conjunctiva, normal sclera Neck- no cervical lymphadenopathy Cardiovascular- normal s1,s2, no murmur Respiratory-  bilateral clear to auscultation, no wheeze, no rhonchi, no crackles, no use of accessory muscles Abdomen- bowel sounds present, soft, non tender Musculoskeletal- able to move all 4 extremities, limited right hip range of motion Neurological- alert and oriented to person, place and time trace right leg edema Skin- warm and dry, right hip surgical incision with Mepilex dressing, skin tear to left hand with dressing in place, easy bruising present Psychiatry- normal mood and affect    Labs reviewed: Basic Metabolic Panel:  Recent Labs  12/14/16 0600 12/15/16 0513 12/16/16 0453  NA 138 136 139  K 3.8 3.8 3.7  CL 103 101 103  CO2 '28 31 28  '$ GLUCOSE 127* 114* 120*  BUN 17 18 23*  CREATININE 0.92 0.87 0.92  CALCIUM 8.5* 8.3* 8.5*   Liver Function Tests:  Recent Labs  09/14/16 0837  AST 12  ALT 9  ALKPHOS 94  BILITOT 0.8  PROT 6.1  ALBUMIN 3.8   No results for input(s): LIPASE, AMYLASE in the last 8760 hours. No results for input(s): AMMONIA in the last 8760 hours. CBC:  Recent Labs  09/14/16 0837 12/12/16 1913  12/14/16 0600 12/15/16 0513 12/16/16 0453  WBC 5.2 6.4  < > 7.9 7.5 7.6  NEUTROABS 3.2 5.3  --   --   --   --   HGB 14.1 12.2*  < > 10.2* 9.3* 9.2*  HCT 42.0 37.4*  < > 31.3* 28.8* 28.2*  MCV 88.5  87.0  < > 89.2 89.4 87.3  PLT 181.0 159  < > 132* 125* 133*  < > = values in this interval not displayed. Cardiac Enzymes: No results for input(s): CKTOTAL, CKMB, CKMBINDEX, TROPONINI in the last 8760 hours. BNP: Invalid input(s): POCBNP CBG: No results for input(s): GLUCAP in the last 8760 hours.  Radiological Exams: Dg Chest 1 View  Result Date: 12/12/2016 CLINICAL DATA:  Fall, fractured hip EXAM: CHEST 1 VIEW COMPARISON:  10/02/2015 FINDINGS: Stable calcified right mid lung zone nodule. Coarse interstitial opacities, likely chronic. No acute consolidation or effusion. Stable cardiomediastinal silhouette with tortuous calcified aorta. Coronary calcification or possible stent. No pneumothorax. IMPRESSION: 1. Probable coarse interstitial opacities at the bilateral lung bases. No acute infiltrate or edema 2. Atherosclerosis of the aorta Electronically Signed   By: Donavan Foil M.D.   On: 12/12/2016 20:22   Dg C-arm 1-60 Min-no Report  Result Date: 12/13/2016 There is no Radiologist interpretation  for this exam.  Dg Hip Unilat With Pelvis 2-3 Views Right  Result Date: 12/13/2016 CLINICAL DATA:  IM nail RIGHT hip EXAM: DG HIP (WITH OR WITHOUT PELVIS) 2-3V RIGHT COMPARISON:  12/12/2016 FLUOROSCOPY TIME:  0 minutes 54.3 seconds Dose:  5.0 mGy Images obtained:  2 FINDINGS: Osseous demineralization. IM nail with compression screw placed across a reduced fracture of the intertrochanteric region of the RIGHT femur. No dislocation seen. Distal extent of IM nail not imaged. IMPRESSION: Post nailing of intertrochanteric fracture RIGHT femur. Electronically Signed   By: Lavonia Dana M.D.   On: 12/13/2016 17:33   Dg Hip Unilat  With Pelvis 2-3 Views Right  Result Date: 12/12/2016 CLINICAL DATA:  Fall taking out the garbage EXAM: DG HIP (WITH OR WITHOUT PELVIS) 2-3V RIGHT COMPARISON:  None. FINDINGS: Surgical clips within the pelvis. SI joints grossly symmetric. Left femoral head projects in joint. Comminuted  intertrochanteric fracture of the right femur with superior migration of the shaft of the femur. Right femoral head projects in joint. Suspect old right superior and inferior  pubic rami fractures. IMPRESSION: 1. Comminuted intertrochanteric fracture of the proximal right femur. Right femoral head projects in joint 2. Suspect old fractures of the right superior inferior pubic rami Electronically Signed   By: Donavan Foil M.D.   On: 12/12/2016 20:20    Assessment/Plan  Unsteady gait Post fall with right hip fracture. Will need to work with therapy team to help regain his strength and balance.  Closed right hip fracture Status post surgical repair. Will need follow-up with orthopedic. Continue calcium and vitamin D supplement. Continue Norco 5-325 milligrams 1-2 tablet every 6 hours as needed for pain. Will have him work with physical therapy and occupational therapy team to help regain his strength, restore his balance and restore his activities of daily living. Continue Lovenox for DVT prophylaxis for 2 weeks. Partial weightbearing to right lower extremity for now  Acute blood loss anemia Postoperative. Monitor H&H. Continue ferrous sulfate 325 mg twice a day.  Thrombocytopenia Monitor platelet count with him on lovenox  Cognitive impairment Supportive care and continue Aricept.  Constipation Continue Senokot 2 tablet daily and MiraLAX twice a day as needed and monitor.  Gastroesophageal reflux disease Continue Prilosec 20 mg daily as needed and monitor the symptom  Hypertension Continue metoprolol tartrate 25 mg twice a day. Monitor blood pressure reading in BMP    Goals of care: short term rehabilitation   Labs/tests ordered: cbc, bmp  Family/ staff Communication: reviewed care plan with patient and nursing supervisor    Blanchie Serve, MD Internal Medicine [place], [place] [address] Cell Phone (Monday-Friday 8  am - 5 pm): [phone] On Call: [phone] and follow prompts after 5 pm and on weekends Office Phone: [phone] Office Fax: [phone]

## 2016-12-21 ENCOUNTER — Institutional Professional Consult (permissible substitution): Payer: Medicare Other | Admitting: Radiation Oncology

## 2016-12-23 ENCOUNTER — Ambulatory Visit (INDEPENDENT_AMBULATORY_CARE_PROVIDER_SITE_OTHER): Payer: Medicare Other | Admitting: Ophthalmology

## 2016-12-26 ENCOUNTER — Ambulatory Visit: Payer: Medicare Other

## 2016-12-26 ENCOUNTER — Ambulatory Visit: Payer: Medicare Other | Admitting: Radiation Oncology

## 2016-12-26 NOTE — Telephone Encounter (Signed)
Spoke to the pt's wife.  No questions at this time.  Advised Mrs Vera  to call back if needed.

## 2016-12-28 ENCOUNTER — Encounter: Payer: Self-pay | Admitting: *Deleted

## 2016-12-28 ENCOUNTER — Telehealth: Payer: Self-pay | Admitting: *Deleted

## 2016-12-28 ENCOUNTER — Ambulatory Visit: Payer: Medicare Other

## 2016-12-28 ENCOUNTER — Ambulatory Visit: Payer: Medicare Other | Admitting: Radiation Oncology

## 2016-12-28 NOTE — Telephone Encounter (Signed)
Called patient home spoke with daughter [name], patient is at [facility] and patient cancelled ,"he has a broken hiop and doesn't feel he can travel  At this time, he wants to see his Surgeon and discuss this first" after wards they will call to reschedule, thanked daughter and will inform MD 9:21 AM

## 2016-12-28 NOTE — Progress Notes (Signed)
Oncology Nurse Navigator Documentation  Oncology Nurse Navigator Flowsheets 12/28/2016  Navigator Location [facility]  Navigator Encounter Type Other/I received referral on Mr. Thorstenson.  I am unclear if he is lung cancer patient. He was set up with Rad Onc yesterday but did not show for appt. I called and spoke with Dr. Ida Rogue nurse. Patient did not want to come yesterday due to just having surgery and not knowing if he can travel per the daughter. The daughter stated she will call when he is able to be seen. I notified HIM of update and that he needs any med onc and to see Rad Onc.   Treatment Phase Pre-Tx/Tx Discussion  Barriers/Navigation Needs Coordination of Care  Interventions Coordination of Care  Coordination of Care Appts  Acuity Level 2  Time Spent with Patient 45

## 2016-12-29 LAB — CBC AND DIFFERENTIAL
HCT: 29 % — AB (ref 41–53)
HEMOGLOBIN: 9.5 g/dL — AB (ref 13.5–17.5)
NEUTROS ABS: 4 /uL
PLATELETS: 325 10*3/uL (ref 150–399)
WBC: 5.1 10^3/mL

## 2017-01-03 ENCOUNTER — Encounter: Payer: Self-pay | Admitting: Adult Health

## 2017-01-03 ENCOUNTER — Non-Acute Institutional Stay (SKILLED_NURSING_FACILITY): Payer: Medicare Other | Admitting: Adult Health

## 2017-01-03 DIAGNOSIS — R2681 Unsteadiness on feet: Secondary | ICD-10-CM

## 2017-01-03 DIAGNOSIS — G3183 Dementia with Lewy bodies: Secondary | ICD-10-CM

## 2017-01-03 DIAGNOSIS — F0280 Dementia in other diseases classified elsewhere without behavioral disturbance: Secondary | ICD-10-CM

## 2017-01-03 DIAGNOSIS — D62 Acute posthemorrhagic anemia: Secondary | ICD-10-CM | POA: Diagnosis not present

## 2017-01-03 DIAGNOSIS — I10 Essential (primary) hypertension: Secondary | ICD-10-CM

## 2017-01-03 DIAGNOSIS — K5901 Slow transit constipation: Secondary | ICD-10-CM

## 2017-01-03 DIAGNOSIS — K219 Gastro-esophageal reflux disease without esophagitis: Secondary | ICD-10-CM

## 2017-01-03 DIAGNOSIS — S72001S Fracture of unspecified part of neck of right femur, sequela: Secondary | ICD-10-CM

## 2017-01-03 NOTE — Progress Notes (Signed)
DATE:  01/03/2017   MRN:  [id number]  BIRTHDAY: [birth year]  Facility:  Nursing Home Location:  [place] Room Number: 329-J  LEVEL OF CARE:  [phone])  Contact Information    Name Relation Home Work [place]   Tobie Lords Daughter   [phone]   Strick,Renee Daughter   [phone]       Code Status History    Date Active Date Inactive Code Status Order ID Comments User Context   12/12/2016 10:18 PM 12/16/2016  4:40 PM Full Code [id number]  Rise Patience, MD ED       Chief Complaint  Patient presents with  . Discharge Note    HISTORY OF PRESENT ILLNESS:  This is an [name] seen for a discharge visit.  He is discharging to home on 01/04/2017 with home health PT, OT, CNA, and Nursing services.    He has been admitted to [facility] on 12/16/2016 for short-term rehabilitation following an admission at [facility] 12/12/2016-12/16/2016 S/P mechanical fall sustaining right hip pain.  X-ray revealed a right hip fracture.  He underwent an ORIF of right intertrochanteric femur fracture by Dr. [name] on 12/13/2016.    Patient was admitted to this facility for short-term rehabilitation after the patient's recent hospitalization.  Patient has completed SNF rehabilitation and therapy has cleared the patient for discharge.   PAST MEDICAL HISTORY:  Past Medical History:  Diagnosis Date  . Adenomatous colon polyp   . Anemia   . Arthritis   . CAD (coronary artery disease)    s/p Taxus DES to prox and mid LAD 06/2003;  LHC  1/07: LM 205, LAD stents ok., oD2 70% jailed (tx conservatively), mid to dist RCA 40-50%, EF 40-45%;  echo 2/07: EF 55-65%  . COPD (chronic obstructive pulmonary disease) ([place])   . Dermatitis   . Diverticulosis of colon   . GERD (gastroesophageal reflux disease)   . Hemorrhoids   . Hiatal hernia   . History of prostate cancer   . History of renal calculi   .  Hyperglycemia   . Hyperlipidemia   . Hypothyroidism   . Migraines   . Osteoarthritis   . Prostate cancer ([place])   . Transfusion history      CURRENT MEDICATIONS: Reviewed  Patient's Medications  New Prescriptions   No medications on file  Previous Medications   CALCIUM-VITAMIN D (OSCAL WITH D) 500-200 MG-UNIT TABLET    Take 1 tablet by mouth 2 (two) times daily.   CLOBETASOL (TEMOVATE) 0.05 % EXTERNAL SOLUTION    APPLY TO AFFECTED AREA TWICE A DAY ON SCALP AS NEEDED (NOT TO FACE)   DONEPEZIL (ARICEPT) 10 MG TABLET    Take 1 tablet (10 mg total) by mouth at bedtime.   FERROUS SULFATE 325 (65 FE) MG EC TABLET    Take 325 mg by mouth 2 (two) times daily.   HYDROCODONE-ACETAMINOPHEN (NORCO/VICODIN) 5-325 MG TABLET    Take 1-2 tablets by mouth every 6 (six) hours as needed for moderate pain.   METOPROLOL TARTRATE (LOPRESSOR) 25 MG TABLET    TAKE 1 TABLET TWICE A DAY   MULTIPLE VITAMINS-MINERALS (OCUVITE PRESERVISION) TABS    Take 1 tablet by mouth 2 (two) times daily.     OMEPRAZOLE (PRILOSEC OTC) 20 MG TABLET    Take 20 mg by mouth daily as needed (reflux).    POLYETHYLENE GLYCOL POWDER (GLYCOLAX/MIRALAX) POWDER  Take 17 g by mouth 2 (two) times daily as needed for moderate constipation. Take as needed to produce 1 normal bowel movement per day.   SENNA (SENOKOT) 8.6 MG TABS TABLET    Take 2 tablets (17.2 mg total) by mouth daily.   TRIAMCINOLONE LOTION (KENALOG) 0.1 %    BID PRN   VITAMIN C (ASCORBIC ACID) 500 MG TABLET    Take 500 mg by mouth daily.  Modified Medications   No medications on file  Discontinued Medications   ENOXAPARIN (LOVENOX) 40 MG/0.4ML INJECTION    Inject 0.4 mLs (40 mg total) into the skin daily.   OVER THE COUNTER MEDICATION    Take 1 tablet by mouth 2 (two) times daily. Calcium Citrate '500mg'$  w/ Vitamin D '800mg'$      Allergies  Allergen Reactions  . Pravastatin     ? Memory deficits improved when went off med  . Aspirin     Daily use causes bleeding in the  bladder.  Can take occasionally  . Meperidine Hcl     REACTION: combative     REVIEW OF SYSTEMS:  GENERAL: no change in appetite, no fatigue, no weight changes, no fever, chills or weakness EYES: Denies change in vision, dry eyes, eye pain, itching or discharge EARS: Denies change in hearing, ringing in ears, or earache NOSE: Denies nasal congestion or epistaxis MOUTH and THROAT: Denies oral discomfort, gingival pain or bleeding, pain from teeth or hoarseness   RESPIRATORY: no cough, SOB, DOE, wheezing, hemoptysis CARDIAC: no chest pain, edema or palpitations GI: no abdominal pain, diarrhea, constipation, heart burn, nausea or vomiting GU: Denies dysuria, frequency, hematuria, incontinence, or discharge PSYCHIATRIC: Denies feeling of depression or anxiety. No report of hallucinations, insomnia, paranoia, or agitation     PHYSICAL EXAMINATION  GENERAL APPEARANCE: Well nourished. In no acute distress. Normal body habitus SKIN:  Right hip 2 surgical incisions are healed HEAD: Normal in size and contour. No evidence of trauma EYES: Lids open and close normally. No blepharitis, entropion or ectropion. PERRL. Conjunctivae are clear and sclerae are white. Lenses are without opacity EARS: Pinnae are normal. Patient hears normal voice tunes of the examiner MOUTH and THROAT: Lips are without lesions. Oral mucosa is moist and without lesions. Tongue is normal in shape, size, and color and without lesions NECK: supple, trachea midline, no neck masses, no thyroid tenderness, no thyromegaly LYMPHATICS: no LAN in the neck, no supraclavicular LAN RESPIRATORY: breathing is even & unlabored, BS CTAB CARDIAC: RRR, no murmur,no extra heart sounds, no edema GI: abdomen soft, normal BS, no masses, no tenderness, no hepatomegaly, no splenomegaly EXTREMITIES:  Able to move X 4 extremities PSYCHIATRIC: Alert to person and place, disoriented to time. Affect and behavior are  appropriate   LABS/RADIOLOGY: Labs reviewed: Basic Metabolic Panel:  Recent Labs  12/14/16 0600 12/15/16 0513 12/16/16 0453 12/20/16  NA 138 136 139 145  K 3.8 3.8 3.7 4.1  CL 103 101 103  --   CO2 '28 31 28  '$ --   GLUCOSE 127* 114* 120*  --   BUN 17 18 23* 24*  CREATININE 0.92 0.87 0.92 0.8  CALCIUM 8.5* 8.3* 8.5*  --    Liver Function Tests:  Recent Labs  09/14/16 0837  AST 12  ALT 9  ALKPHOS 94  BILITOT 0.8  PROT 6.1  ALBUMIN 3.8   CBC:  Recent Labs  12/12/16 1913  12/14/16 0600 12/15/16 0513 12/16/16 0453 12/20/16 12/29/16  WBC 6.4  < > 7.9  7.5 7.6 6.2 5.1  NEUTROABS 5.3  --   --   --   --  4 4  HGB 12.2*  < > 10.2* 9.3* 9.2* 8.9* 9.5*  HCT 37.4*  < > 31.3* 28.8* 28.2* 28* 29*  MCV 87.0  < > 89.2 89.4 87.3  --   --   PLT 159  < > 132* 125* 133* 238 325  < > = values in this interval not displayed. Lipid Panel:  Recent Labs  09/14/16 0837  HDL 47.50    Dg Chest 1 View  Result Date: 12/12/2016 CLINICAL DATA:  Fall, fractured hip EXAM: CHEST 1 VIEW COMPARISON:  10/02/2015 FINDINGS: Stable calcified right mid lung zone nodule. Coarse interstitial opacities, likely chronic. No acute consolidation or effusion. Stable cardiomediastinal silhouette with tortuous calcified aorta. Coronary calcification or possible stent. No pneumothorax. IMPRESSION: 1. Probable coarse interstitial opacities at the bilateral lung bases. No acute infiltrate or edema 2. Atherosclerosis of the aorta Electronically Signed   By: Donavan Foil M.D.   On: 12/12/2016 20:22   Dg C-arm 1-60 Min-no Report  Result Date: 12/13/2016 There is no Radiologist interpretation  for this exam.  Dg Hip Unilat With Pelvis 2-3 Views Right  Result Date: 12/13/2016 CLINICAL DATA:  IM nail RIGHT hip EXAM: DG HIP (WITH OR WITHOUT PELVIS) 2-3V RIGHT COMPARISON:  12/12/2016 FLUOROSCOPY TIME:  0 minutes 54.3 seconds Dose:  5.0 mGy Images obtained:  2 FINDINGS: Osseous demineralization. IM nail with compression  screw placed across a reduced fracture of the intertrochanteric region of the RIGHT femur. No dislocation seen. Distal extent of IM nail not imaged. IMPRESSION: Post nailing of intertrochanteric fracture RIGHT femur. Electronically Signed   By: Lavonia Dana M.D.   On: 12/13/2016 17:33   Dg Hip Unilat  With Pelvis 2-3 Views Right  Result Date: 12/12/2016 CLINICAL DATA:  Fall taking out the garbage EXAM: DG HIP (WITH OR WITHOUT PELVIS) 2-3V RIGHT COMPARISON:  None. FINDINGS: Surgical clips within the pelvis. SI joints grossly symmetric. Left femoral head projects in joint. Comminuted intertrochanteric fracture of the right femur with superior migration of the shaft of the femur. Right femoral head projects in joint. Suspect old right superior and inferior pubic rami fractures. IMPRESSION: 1. Comminuted intertrochanteric fracture of the proximal right femur. Right femoral head projects in joint 2. Suspect old fractures of the right superior inferior pubic rami Electronically Signed   By: Donavan Foil M.D.   On: 12/12/2016 20:20    ASSESSMENT/PLAN:  Unsteady gait - for Home health  PT and OT, for therapeutic strengthening exercises; fall precautions  Closed right hip fracture S/P ORIF - for Home health PT and OT, for therapeutic strengthening exercises; continue Norco 5/325 mg 1-2 tabs by mouth every 6 hours when necessary for pain; follow-up with orthopedic surgeon    Hypertension - well controlled; continue metoprolol 25 mg 1 tab by mouth twice a day  Anemia, acute blood loss - stable;  continue ferrous sulfate 325 mg 1 tab by mouth twice a day  Dementia - continue Aricept 10 mg 1 tab by mouth daily at bedtime and supportive care  GERD - continue Prilosec 20 mg 1 tab by mouth daily when necessary   Constipation - continue MiraLAX 17 g by mouth twice a day when necessary, Senokot 8.6 mg 2 tabs by mouth daily at bedtime     I have filled out patient's discharge paperwork and written  prescriptions.  Patient will receive home health PT, OT,  Nursing and CNA.  DME provided:  standard wheelchair with elevating leg rests, cushion, and brake extensions  Total discharge time: Greater than 30 minutes Greater than 50% was spent in counseling and coordination of care.  Discharge time involved coordination of the discharge process with social worker, nursing staff and therapy department. Medical justification for home health services/DME verified.   Monina C. [place] - NP   [employer] [phone]

## 2017-01-09 ENCOUNTER — Telehealth: Payer: Self-pay | Admitting: Internal Medicine

## 2017-01-09 NOTE — Telephone Encounter (Addendum)
[name] with [facility] needs verbal for home health PT 3 wk/ 1 2 wk/ 6

## 2017-01-09 NOTE — Telephone Encounter (Signed)
Estill Bamberg with [facility] at home would like verbal for skilled Nursing to see pt for disease management  2 wk /1  1 wk  /2 Also order for  Home health aid for bathing  2 wk/ 4

## 2017-01-09 NOTE — Telephone Encounter (Signed)
I haven't seen him for his hip fracture  And post hospital  \     hom e health  Orders related to this  should come from the   Care   Providing team ? Ortho?  until I see him in the office  To assess medical issues.

## 2017-01-10 NOTE — Telephone Encounter (Signed)
haven't seen him yet   Please have ortho  Involved with PT because of his hip fracture and surgery . He has appt with ne in a few weeks.

## 2017-01-10 NOTE — Telephone Encounter (Signed)
Left a message for [name] and informed her that Dr. Regis Bill is not able to sign off on orders.  Advised she call ortho or other specialist.

## 2017-01-11 NOTE — Telephone Encounter (Signed)
Yes olin should be doing the PT orders for post HIP surgery fracture  Thanks [facility]

## 2017-01-11 NOTE — Telephone Encounter (Signed)
[name] with PT did get in touch with Dr [name] for weight clarifications. Partial weight bearing Is it OK to start? They cannot wait until pt is seen, or do you want them to get orders from Dr [name]?

## 2017-01-11 NOTE — Telephone Encounter (Signed)
Ebony Hail is aware to call Dr [name] office for the PT orders. She also states she will also pass this information to OT for their orders as well

## 2017-01-17 ENCOUNTER — Ambulatory Visit: Payer: Medicare Other

## 2017-01-18 ENCOUNTER — Telehealth: Payer: Self-pay | Admitting: Internal Medicine

## 2017-01-18 MED ORDER — HYDROCODONE-ACETAMINOPHEN 5-325 MG PO TABS: 1.0000 | ORAL_TABLET | Freq: Four times a day (QID) | ORAL | 0 refills | Status: DC | PRN

## 2017-01-18 NOTE — Telephone Encounter (Signed)
Ok to refill x 1  

## 2017-01-18 NOTE — Telephone Encounter (Signed)
Pt new rx hydrocodone

## 2017-01-18 NOTE — Telephone Encounter (Signed)
Pt's wife notified to pick up at the front desk.

## 2017-01-30 ENCOUNTER — Ambulatory Visit (INDEPENDENT_AMBULATORY_CARE_PROVIDER_SITE_OTHER): Payer: Medicare Other | Admitting: Internal Medicine

## 2017-01-30 ENCOUNTER — Encounter: Payer: Self-pay | Admitting: Internal Medicine

## 2017-01-30 VITALS — BP 170/90 | HR 89 | Temp 97.6°F | Ht 71.0 in | Wt 167.4 lb

## 2017-01-30 DIAGNOSIS — R52 Pain, unspecified: Secondary | ICD-10-CM

## 2017-01-30 DIAGNOSIS — C799 Secondary malignant neoplasm of unspecified site: Secondary | ICD-10-CM | POA: Diagnosis not present

## 2017-01-30 DIAGNOSIS — I10 Essential (primary) hypertension: Secondary | ICD-10-CM

## 2017-01-30 DIAGNOSIS — D649 Anemia, unspecified: Secondary | ICD-10-CM | POA: Diagnosis not present

## 2017-01-30 DIAGNOSIS — C801 Malignant (primary) neoplasm, unspecified: Secondary | ICD-10-CM

## 2017-01-30 DIAGNOSIS — F19951 Other psychoactive substance use, unspecified with psychoactive substance-induced psychotic disorder with hallucinations: Secondary | ICD-10-CM

## 2017-01-30 DIAGNOSIS — G3184 Mild cognitive impairment, so stated: Secondary | ICD-10-CM

## 2017-01-30 DIAGNOSIS — Z8781 Personal history of (healed) traumatic fracture: Secondary | ICD-10-CM

## 2017-01-30 DIAGNOSIS — IMO0002 Reserved for concepts with insufficient information to code with codable children: Secondary | ICD-10-CM

## 2017-01-30 DIAGNOSIS — Z79899 Other long term (current) drug therapy: Secondary | ICD-10-CM

## 2017-01-30 LAB — BASIC METABOLIC PANEL
BUN: 15 mg/dL (ref 6–23)
CHLORIDE: 103 meq/L (ref 96–112)
CO2: 32 mEq/L (ref 19–32)
Calcium: 9.4 mg/dL (ref 8.4–10.5)
Creatinine, Ser: 0.87 mg/dL (ref 0.40–1.50)
GFR: 88.69 mL/min (ref >60.00)
GLUCOSE: 93 mg/dL (ref 70–99)
POTASSIUM: 4.3 meq/L (ref 3.5–5.1)
Sodium: 142 mEq/L (ref 135–145)

## 2017-01-30 LAB — CBC WITH DIFFERENTIAL/PLATELET
BASOS PCT: 0.6 % (ref 0.0–3.0)
Basophils Absolute: 0 10*3/uL (ref 0.0–0.1)
EOS ABS: 0.1 10*3/uL (ref 0.0–0.7)
Eosinophils Relative: 2.1 % (ref 0.0–5.0)
HEMATOCRIT: 41.2 % (ref 39.0–52.0)
HEMOGLOBIN: 13.7 g/dL (ref 13.0–17.0)
LYMPHS PCT: 19.1 % (ref 12.0–46.0)
Lymphs Abs: 1.1 10*3/uL (ref 0.7–4.0)
MCHC: 33.2 g/dL (ref 30.0–36.0)
MCV: 88.4 fl (ref 78.0–100.0)
MONOS PCT: 8 % (ref 3.0–12.0)
Monocytes Absolute: 0.4 10*3/uL (ref 0.1–1.0)
Neutro Abs: 3.9 10*3/uL (ref 1.4–7.7)
Neutrophils Relative %: 70.2 % (ref 43.0–77.0)
Platelets: 223 10*3/uL (ref 150.0–400.0)
RBC: 4.66 Mil/uL (ref 4.22–5.81)
RDW: 15.4 % (ref 11.5–15.5)
WBC: 5.6 10*3/uL (ref 4.0–10.5)

## 2017-01-30 MED ORDER — HYDROCODONE-ACETAMINOPHEN 10-325 MG PO TABS: ORAL_TABLET | ORAL | 0 refills | Status: DC

## 2017-01-30 NOTE — Patient Instructions (Addendum)
  Okay to go back to your regular pain pill as discussed. However hydrocodone in other situations can bring out hallucinations father problems are ongoing. You can get tolerant to pain medicine the longer you take it on a regular basis. Continue precautions to minimize taking too much.  Your blood pressure was elevated today but back because it be be related to the office visit. Please make sure your blood pressure readings are below 140/90 in the home situation when you are more relaxed.     Please reschedule the appointments with the [place]   surgery etc. Check labs today will let you know those results. We'll include a blood count because of the anemia related to your hip fracture and surgery. I would like you to discuss the history of the hallucinations with your neurologist Dr. Delice Lesch when you go see her. And get her input.

## 2017-01-30 NOTE — Progress Notes (Signed)
Chief Complaint  Patient presents with  . Follow-up    post hosp rehab meds  other     HPI: [name] 83 y.o. 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 He is here with his daughter today. He is been out of rehabilitation about 2 weeks getting physical therapy at home using a walker getting around pretty well without falling. Concerns he feels like he was seeing things having hallucinations when he was on a different pain pill. He has the bottle was hydrocodone 5/325 take 1-2 every 4-6 hours as needed for pain. When he is back on the regular half a pill of the 10/325 every 5 hours he states that he wasn't getting these problem. He still on the Aricept. No unusual bleeding breathing problems chest pain. His blood pressure has been in line when he has physical therapy at home. Denies chest pain shortness of breath. Above baseline. States that he takes his medicines in the pillbox and has a regimented time to keep a problem with confusion of medication.   ROS: See pertinent positives and negatives per HPI. No cp sob syncope  Bleeding cough  ( wife had documented flu)    Past Medical History:  Diagnosis Date  . Adenomatous colon polyp   . Anemia   . Arthritis   . CAD (coronary artery disease)    s/p Taxus DES to prox and mid LAD 06/2003;  LHC  1/07: LM 205, LAD stents ok., oD2 70% jailed (tx conservatively), mid to dist RCA 40-50%, EF 40-45%;  echo 2/07: EF 55-65%  . COPD (chronic obstructive pulmonary disease) ([place])   . Dermatitis   . Diverticulosis of colon   . GERD (gastroesophageal reflux disease)   . Hemorrhoids   . Hiatal hernia   . History of prostate cancer   . History of renal calculi   . Hyperglycemia   . Hyperlipidemia   . Hypothyroidism   . Migraines   . Osteoarthritis   . Prostate cancer ([place])   . Transfusion  history     Family History  Problem Relation Age of Onset  . Hypertension Other   . Arthritis Other   . Cancer Other   . Rheum arthritis Daughter     Social History   Social History  . Marital status: Married    Spouse name: N/A  . Number of children: N/A  . Years of education: N/A   Occupational History  . Retired Retired   Social History Main Topics  . Smoking status: Former Research scientist (life sciences)  . Smokeless tobacco: Never Used     Comment: Stopped in 1986  . Alcohol use No  . Drug use: No  . Sexual activity: Not Asked   Other Topics Concern  . None   Social History Narrative   No regular exercise   HHof 2   Married wife no pets       Remote hx of tobacco stopped 1965   No etoh   REtired Investment banker, corporate level.    Outpatient Medications Prior to Visit  Medication Sig Dispense Refill  . calcium-vitamin D (OSCAL WITH D) 500-200 MG-UNIT tablet Take 1 tablet by mouth 2 (two) times daily.    . clobetasol (TEMOVATE) 0.05 % external solution APPLY TO AFFECTED AREA TWICE A DAY ON SCALP AS NEEDED (NOT TO FACE)  2  . donepezil (ARICEPT) 10  MG tablet Take 1 tablet (10 mg total) by mouth at bedtime. 90 tablet 3  . Multiple Vitamins-Minerals (OCUVITE PRESERVISION) TABS Take 1 tablet by mouth 2 (two) times daily.      Marland Kitchen omeprazole (PRILOSEC OTC) 20 MG tablet Take 20 mg by mouth daily as needed (reflux).     . polyethylene glycol powder (GLYCOLAX/MIRALAX) powder Take 17 g by mouth 2 (two) times daily as needed for moderate constipation. Take as needed to produce 1 normal bowel movement per day.    . senna (SENOKOT) 8.6 MG TABS tablet Take 2 tablets (17.2 mg total) by mouth daily.    Marland Kitchen triamcinolone lotion (KENALOG) 0.1 % BID PRN  3  . vitamin C (ASCORBIC ACID) 500 MG tablet Take 500 mg by mouth daily.    Marland Kitchen HYDROcodone-acetaminophen (NORCO/VICODIN) 5-325 MG tablet Take 1-2 tablets by mouth every 6 (six) hours as needed for moderate pain. 50 tablet 0  . ferrous sulfate 325 (65 FE) MG EC  tablet Take 325 mg by mouth 2 (two) times daily.    . metoprolol tartrate (LOPRESSOR) 25 MG tablet TAKE 1 TABLET TWICE A DAY (Patient not taking: Reported on 01/30/2017) 180 tablet 0   No facility-administered medications prior to visit.      EXAM:  BP (!) 170/90 (BP Location: Right Arm, Patient Position: Sitting, Cuff Size: Normal)   Pulse 89   Temp 97.6 F (36.4 C) (Oral)   Ht '5\' 11"'$  (1.803 m)   Wt 167 lb 6.4 oz (75.9 kg)   BMI 23.35 kg/m   Body mass index is 23.35 kg/m. Repeat blood pressure 160/80 right 158/80 left. GENERAL: vitals reviewed and listed above, alert, oriented, appears well hydrated and in no acute distressLooks comfortable mildly washed out. Able to get up and walk with a walker quite easily with mild favoritism right. Normocephalic eyes clear HEENT: atraumatic, conjunctiva  clear, no obvious abnormalities on inspection of external nose and ears NECK: no obvious masses on inspection palpation  LUNGS: clear to auscultation bilaterally, no wheezes, rales or rhonchi,  CV: HRRR, no clubbing cyanosis or  peripheral edema nl cap refill  MS: moves all extremities without noticeable focal  abnormality well-healed scars on the knees. No foot drop PSYCH: pleasant and cooperative, no obvious depression or anxiety formal mental status not done but normal conversation. Normal speech. Lab Results  Component Value Date   WBC 5.1 12/29/2016   HGB 9.5 (A) 12/29/2016   HCT 29 (A) 12/29/2016   PLT 325 12/29/2016   GLUCOSE 120 (H) 12/16/2016   CHOL 150 09/14/2016   TRIG 93.0 09/14/2016   HDL 47.50 09/14/2016   LDLCALC 84 09/14/2016   ALT 9 09/14/2016   AST 12 09/14/2016   NA 145 12/20/2016   K 4.1 12/20/2016   CL 103 12/16/2016   CREATININE 0.8 12/20/2016   BUN 24 (A) 12/20/2016   CO2 28 12/16/2016   TSH 1.14 09/14/2016   PSA 0.03 (L) 09/14/2016   INR 1.09 02/25/2012   BP Readings from Last 3 Encounters:  01/30/17 (!) 170/90  01/03/17 132/78  12/20/16 (!) 121/57    Wt Readings from Last 3 Encounters:  01/30/17 167 lb 6.4 oz (75.9 kg)  01/03/17 179 lb (81.2 kg)  12/20/16 179 lb (81.2 kg)     ASSESSMENT AND PLAN:  Discussed the following assessment and plan:  Anemia, unspecified type - Plan: CBC with Differential/Platelet, Basic metabolic panel  Essential hypertension - see text  - Plan: CBC with Differential/Platelet, Basic  metabolic panel  Medication management - Plan: CBC with Differential/Platelet, Basic metabolic panel  Pain management - Plan: CBC with Differential/Platelet, Basic metabolic panel  Hx of fracture of hip  Metastatic squamous cell carcinoma (HCC) - delayed eval and plan cause of hip fx .   Amnestic MCI (mild cognitive impairment with memory loss)  Hallucination, drug-induced ([place])? vs other  - see text .   blood pressure is up today.Previous readings with physical therapy etc. have been in range. Felt to be an outlier of having to walk a long distance into the office and question no recent pain medicine. Need to follow up on the anemia probably from surgery. No active bleeding noted  Hallucinations  With  Other dosing of   hydrocodon 5 325  1-2 every 4 hours as needed . Discussed with patient that it may not related to that although he could've had a mini withdrawal of taking less medicine but the stress of illness early cognitive change medicine surgery etc. could have aggravated this. However daughter states that he has had a few of these issues at home. Would like to go back on the other formulation of the hydrocodone. I did review with them how he takes medicines to avoid under an overdosing. He apparently has a good system.  If these continue to happen would have him discuss this with neurology Dr. Delice Lesch earlier than his  appointment in April  Reviewed fall prevention distraction as a factor etc. Attention to good nutrition Plan follow-up visit in 3 months he will be contacting appropriate clinicians in regard to  the left axillary adenocarcinoma diagnosis. -Patient advised to return or notify health care team  if  new concerns arise. In the interim.  Total visit 41mns > 50% spent counseling and coordinating care as indicated in above note and in instructions to patient .       Patient Instructions   Okay to go back to your regular pain pill as discussed. However hydrocodone in other situations can bring out hallucinations father problems are ongoing. You can get tolerant to pain medicine the longer you take it on a regular basis. Continue precautions to minimize taking too much.  Your blood pressure was elevated today but back because it be be related to the office visit. Please make sure your blood pressure readings are below 140/90 in the home situation when you are more relaxed.     Please reschedule the appointments with the [facility]  surgery etc. Check labs today will let you know those results. We'll include a blood count because of the anemia related to your hip fracture and surgery. I would like you to discuss the history of the hallucinations with your neurologist Dr. ADelice Leschwhen you go see her. And get her input.   W[place]ley Brooking Zivah Mayr M.D.

## 2017-02-02 ENCOUNTER — Encounter: Payer: Self-pay | Admitting: Oncology

## 2017-02-02 ENCOUNTER — Telehealth: Payer: Self-pay | Admitting: Oncology

## 2017-02-02 NOTE — Telephone Encounter (Signed)
Appt has been scheduled for the pt to see Dr. Alen Blew on 3/13 at [place] to arrive 30 minutes early. Previous referred in 11/2016, but had to cancel due to hip surgery. Per H[place] Dyer probable met prostate. Pt verified mailing address as well as other demographic information.

## 2017-02-14 ENCOUNTER — Encounter: Payer: Self-pay | Admitting: Radiation Oncology

## 2017-02-14 ENCOUNTER — Telehealth: Payer: Self-pay | Admitting: Internal Medicine

## 2017-02-14 ENCOUNTER — Ambulatory Visit (HOSPITAL_BASED_OUTPATIENT_CLINIC_OR_DEPARTMENT_OTHER): Payer: Medicare Other | Admitting: Oncology

## 2017-02-14 ENCOUNTER — Telehealth: Payer: Self-pay | Admitting: Oncology

## 2017-02-14 VITALS — BP 144/64 | HR 70 | Temp 98.4°F | Resp 18 | Wt 170.1 lb

## 2017-02-14 DIAGNOSIS — E039 Hypothyroidism, unspecified: Secondary | ICD-10-CM | POA: Diagnosis not present

## 2017-02-14 DIAGNOSIS — C61 Malignant neoplasm of prostate: Secondary | ICD-10-CM | POA: Diagnosis not present

## 2017-02-14 DIAGNOSIS — E785 Hyperlipidemia, unspecified: Secondary | ICD-10-CM

## 2017-02-14 DIAGNOSIS — C4442 Squamous cell carcinoma of skin of scalp and neck: Secondary | ICD-10-CM | POA: Diagnosis not present

## 2017-02-14 NOTE — Progress Notes (Signed)
Reason for Referral: Squamous cell carcinoma   HPI: This is a pleasant 83 year old gentleman currently of [place] where he is residing with his wife. He is a gentleman with history of dementia and hyperlipidemia as well as osteoarthritis. He has also history of prostate cancer that has been under reasonable control with previous definitive radiation therapy. 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 He was also noted to have scalp lesion and underwent wide excision of that lesion in 2017. The details of that operation is not available to me. He started developing a left axillary enlargement and was evaluated by Dr. Harlow Asa and underwent an excisional biopsy in 12/08/2016. The final pathology showed metastatic squamous cell carcinoma. Previous imaging studies including a CT scan in November 2016 was unremarkable. He also had a chest x-ray on 12/12/2016 that was unremarkable. Patient referred to for medical oncology as well as radiation oncology evaluation. Prior to his evaluation he sustained a fall and required intramedullary fixation of a femoral fracture as well as ORIF of the right hip on 12/13/2016. He has been recovering reasonably well at this time and he is back home. He does take hydrocodone for pain which have caused him some hallucination at times. He is still ambulating with the help of a walker without any recent falls. He denied any other skin masses or lesions. He reports his axillary mass has resolved.  He does not report any headaches, blurry vision, syncope or seizures. He is not reporting any fevers, chills or sweats. He does not report any cough, wheezing or hemoptysis. He does not report any nausea, vomiting or abdominal pain. He is not reporting frequency urgency or hesitancy. He does not report any skeletal complaints of arthralgias or myalgias. Remaining review of systems unremarkable.   Past Medical History:   Diagnosis Date  . Adenomatous colon polyp   . Anemia   . Arthritis   . CAD (coronary artery disease)    s/p Taxus DES to prox and mid LAD 06/2003;  LHC  1/07: LM 205, LAD stents ok., oD2 70% jailed (tx conservatively), mid to dist RCA 40-50%, EF 40-45%;  echo 2/07: EF 55-65%  . COPD (chronic obstructive pulmonary disease) ([place])   . Dermatitis   . Diverticulosis of colon   . GERD (gastroesophageal reflux disease)   . Hemorrhoids   . Hiatal hernia   . History of prostate cancer   . History of renal calculi   . Hyperglycemia   . Hyperlipidemia   . Hypothyroidism   . Migraines   . Osteoarthritis   . Prostate cancer ([place])   . Transfusion history   :  Past Surgical History:  Procedure Laterality Date  . APPENDECTOMY    . broken knee cap    . broken wrist    . COLONOSCOPY  2005  . CORONARY STENT PLACEMENT     2004  . FEMUR IM NAIL Right 12/13/2016   Procedure: INTRAMEDULLARY (IM) NAIL FEMORAL;  Surgeon: Paralee Cancel, MD;  Location: [facility];  Service: Orthopedics;  Laterality: Right;  . left knee replacement    . LITHOTRIPSY  1996  . PROSTATECTOMY    . radiation for prostate    . RETINAL DETACHMENT SURGERY    . right knee replacement     x 2  . TOTAL KNEE ARTHROPLASTY  2001/2002  :   Current Outpatient Prescriptions:  .  calcium-vitamin D (OSCAL WITH D) 500-200 MG-UNIT tablet, Take 1 tablet  by mouth 2 (two) times daily., Disp: , Rfl:  .  clobetasol (TEMOVATE) 0.05 % external solution, APPLY TO AFFECTED AREA TWICE A DAY ON SCALP AS NEEDED (NOT TO FACE), Disp: , Rfl: 2 .  donepezil (ARICEPT) 10 MG tablet, Take 1 tablet (10 mg total) by mouth at bedtime., Disp: 90 tablet, Rfl: 3 .  HYDROcodone-acetaminophen (NORCO) 10-325 MG tablet, Take half a tablet every 5 hours as needed, Disp: 60 tablet, Rfl: 0 .  metoprolol tartrate (LOPRESSOR) 25 MG tablet, TAKE 1 TABLET TWICE A DAY, Disp: 180 tablet, Rfl: 0 .  Multiple Vitamins-Minerals (OCUVITE PRESERVISION) TABS, Take 1 tablet by mouth  2 (two) times daily.  , Disp: , Rfl: :  Allergies  Allergen Reactions  . Aspirin Other (See Comments)    Daily use causes bleeding in the bladder.  Can take occasionally  . Meperidine Hcl Other (See Comments)    REACTION: combative  . Pravastatin Other (See Comments)    ? Memory deficits improved when went off med  :  Family History  Problem Relation Age of Onset  . Hypertension Other   . Arthritis Other   . Cancer Other   . Rheum arthritis Daughter   :  Social History   Social History  . Marital status: Married    Spouse name: N/A  . Number of children: N/A  . Years of education: N/A   Occupational History  . Retired Retired   Social History Main Topics  . Smoking status: Former Research scientist (life sciences)  . Smokeless tobacco: Never Used     Comment: Stopped in 1986  . Alcohol use No  . Drug use: No  . Sexual activity: Not on file   Other Topics Concern  . Not on file   Social History Narrative   No regular exercise   HHof 2   Married wife no pets       Remote hx of tobacco stopped 1965   No etoh   REtired Investment banker, corporate level.  :  Pertinent items are noted in HPI.  Exam: Blood pressure (!) 144/64, pulse 70, temperature 98.4 F (36.9 C), temperature source Oral, resp. rate 18, weight 170 lb 1.6 oz (77.2 kg), SpO2 100 %.  ECOG 1 General appearance: alert and cooperative elderly gentleman without distress. Throat: no oral thrush. Neck: no adenopathy Back: negative Resp: clear to auscultation bilaterally Chest wall: no tenderness Cardio: regular rate and rhythm, S1, S2 normal, no murmur, click, rub or gallop GI: soft, non-tender; bowel sounds normal; no masses,  no organomegaly Extremities: extremities normal, atraumatic, no cyanosis or edema Pulses: 2+ and symmetric Skin: Skin color, texture, turgor normal. No rashes or lesions. Multiple nevi noted on his neck and chest.  Lymph node examination showed no lymphadenopathy and a well-healed incision noted on his left  axilla. No lymphadenopathy noted.   CBC    Component Value Date/Time   WBC 5.6 01/30/2017 1147   RBC 4.66 01/30/2017 1147   HGB 13.7 01/30/2017 1147   HCT 41.2 01/30/2017 1147   PLT 223.0 01/30/2017 1147   MCV 88.4 01/30/2017 1147   MCH 28.5 12/16/2016 0453   MCHC 33.2 01/30/2017 1147   RDW 15.4 01/30/2017 1147   LYMPHSABS 1.1 01/30/2017 1147   MONOABS 0.4 01/30/2017 1147   EOSABS 0.1 01/30/2017 1147   BASOSABS 0.0 01/30/2017 1147     Chemistry      Component Value Date/Time   NA 142 01/30/2017 1147   NA 145 12/20/2016   K  4.3 01/30/2017 1147   CL 103 01/30/2017 1147   CO2 32 01/30/2017 1147   BUN 15 01/30/2017 1147   BUN 24 (A) 12/20/2016   CREATININE 0.87 01/30/2017 1147   GLU 105 12/20/2016      Component Value Date/Time   CALCIUM 9.4 01/30/2017 1147   ALKPHOS 94 09/14/2016 0837   AST 12 09/14/2016 0837   ALT 9 09/14/2016 0837   BILITOT 0.8 09/14/2016 0837       Assessment and Plan:   83 year old gentleman with the following issues:  1. 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 He presented with a palpated left axillary mass that has been removed surgically on 12/08/2016. The pathology confirmed the presence of metastatic squamous cell carcinoma. 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  The natural course of this disease was discussed with the patient and his wife. The next step is to obtain imaging studies for staging purposes. A PET scan would be essential to rule out any systemic metastasis. If there is no systemic metastasis at this time, local therapy may be needed with adjuvant radiation therapy. If systemic metastasis been detected, we will discuss treatment options at that time.  He will obtain a PET CT scan in the immediate future and refer him to radiation oncology for an evaluation.  2. Prostate cancer: He is status post a  definitive radiation therapy without any evidence of recurrent disease. He follows with Dr. Jeris Penta in his most recent PSA have been in October 2017 nearly undetectable.  3. Follow-up: Will be in the next few weeks to discuss the results of his PET scan.

## 2017-02-14 NOTE — Telephone Encounter (Signed)
Appointments scheduled per 3/13 LOS. Patient given AVS report and calendars with future scheduled appointments.

## 2017-02-14 NOTE — Telephone Encounter (Signed)
Gave patient AVS and scheduled appts per 02/14/2017 los. [facility] to contact patient about PET scan . Contacted Radiation oncology with referral and made appt with [facility] - patient is aware of date and time. Md appt to be scheduled for 4/6 per 02/14/2017 los. Unable to schedule due to overbook. Will call and contact patient when appt is scheduled.

## 2017-02-15 ENCOUNTER — Telehealth: Payer: Self-pay | Admitting: Oncology

## 2017-02-15 NOTE — Telephone Encounter (Signed)
Called to confirm with patient that 4/6 appt was made per 02/14/2017 los.

## 2017-02-21 NOTE — Progress Notes (Signed)
Location of  Cancer: Left axilla (Metastatic squamous cell carcinoma)   Histology per Pathology Report: Diagnosis 1.03/2017 Lymph node for lymphoma - METASTATIC SQUAMOUS CELL CARCINOMA.      Past/Anticipated interventions by surgeon, if any::  Right Femur IN NAIL  Dr, Paralee Cancel, MD, 12/13/16 from fall  Scalp lesion  Excision 2017  Past/Anticipated interventions by medical oncology, if any: Chemotherapy : Dr. Corrin Parker 02/14/17, ordered Pet scan,03/01/17    Pain issues, if any:   SAFETY ISSUES:  Prior radiation?Yes , 1996 prostate cancer, /prostatectomy  Is the patient on methotrexate?  Current Complaints / other details:  Married,, dementia, Hx Prostate cancer,  Upper chest squamous cell carcinoma  Dx 2016 ? Can't find a note    Essance Gatti, Felicita Gage, RN 02/21/2017,10:27 AM

## 2017-02-23 ENCOUNTER — Ambulatory Visit
Admission: RE | Admit: 2017-02-23 | Discharge: 2017-02-23 | Disposition: A | Discharge status: Home / Self Care | Payer: Medicare Other | Source: Ambulatory Visit | Attending: Radiation Oncology | Admitting: Radiation Oncology

## 2017-02-23 ENCOUNTER — Ambulatory Visit: Payer: Medicare Other

## 2017-02-23 DIAGNOSIS — Z79899 Other long term (current) drug therapy: Secondary | ICD-10-CM | POA: Insufficient documentation

## 2017-02-23 DIAGNOSIS — M1990 Unspecified osteoarthritis, unspecified site: Secondary | ICD-10-CM | POA: Insufficient documentation

## 2017-02-23 DIAGNOSIS — Z510 Encounter for antineoplastic radiation therapy: Secondary | ICD-10-CM | POA: Insufficient documentation

## 2017-02-23 DIAGNOSIS — I2510 Atherosclerotic heart disease of native coronary artery without angina pectoris: Secondary | ICD-10-CM | POA: Insufficient documentation

## 2017-02-23 DIAGNOSIS — J449 Chronic obstructive pulmonary disease, unspecified: Secondary | ICD-10-CM | POA: Insufficient documentation

## 2017-02-23 DIAGNOSIS — Z87891 Personal history of nicotine dependence: Secondary | ICD-10-CM | POA: Insufficient documentation

## 2017-02-23 DIAGNOSIS — K5790 Diverticulosis of intestine, part unspecified, without perforation or abscess without bleeding: Secondary | ICD-10-CM | POA: Insufficient documentation

## 2017-02-23 DIAGNOSIS — C773 Secondary and unspecified malignant neoplasm of axilla and upper limb lymph nodes: Secondary | ICD-10-CM | POA: Insufficient documentation

## 2017-02-23 DIAGNOSIS — E785 Hyperlipidemia, unspecified: Secondary | ICD-10-CM | POA: Insufficient documentation

## 2017-02-23 DIAGNOSIS — F0390 Unspecified dementia without behavioral disturbance: Secondary | ICD-10-CM | POA: Insufficient documentation

## 2017-02-23 DIAGNOSIS — C801 Malignant (primary) neoplasm, unspecified: Secondary | ICD-10-CM

## 2017-02-23 DIAGNOSIS — Z8546 Personal history of malignant neoplasm of prostate: Secondary | ICD-10-CM | POA: Insufficient documentation

## 2017-02-23 DIAGNOSIS — C779 Secondary and unspecified malignant neoplasm of lymph node, unspecified: Secondary | ICD-10-CM

## 2017-02-23 DIAGNOSIS — K219 Gastro-esophageal reflux disease without esophagitis: Secondary | ICD-10-CM | POA: Insufficient documentation

## 2017-02-23 NOTE — Progress Notes (Signed)
Location of  Cancer: Left axilla (Metastatic squamous cell carcinoma)   Histology per Pathology Report: Diagnosis 1.03/2017 Lymph node for lymphoma - METASTATIC SQUAMOUS CELL CARCINOMA.      Past/Anticipated interventions by surgeon, if any::  Right Femur IN NAIL  Dr, Paralee Cancel, MD, 12/13/16 from fall  Scalp lesion  Excision 2017  Past/Anticipated interventions by medical oncology, if any: Chemotherapy : Dr. Corrin Parker 02/14/17, ordered Pet scan,03/01/17    Pain issues, if any:   SAFETY ISSUES:  Prior radiation?Yes , 1993 prostate cancer, 3800  Radiation txs  On 05/22/1992, Dr. Noreene Filbert, MD [facility]  Is the patient on methotrexate? no   Current Complaints / other details:  Married,, dementia, Hx Prostate cancer, /prostatectomy, quit smoking 1968 COPD, CAD,lithotripsy 1996   Upper chest squamous cell carcinoma  Dx 2016 ? Can't find a note    Rebecca Eaton, RN 02/23/2017,11:21 AM

## 2017-02-24 ENCOUNTER — Telehealth: Payer: Self-pay | Admitting: *Deleted

## 2017-02-24 ENCOUNTER — Telehealth: Payer: Self-pay | Admitting: Radiation Oncology

## 2017-02-24 NOTE — Telephone Encounter (Signed)
LVM to reschedule appointment from the 29th to the 26th, patient did not answer so will leave appt as is for now

## 2017-02-24 NOTE — Telephone Encounter (Signed)
CALLED PATIENT TO INFORM THAT PET SCAN HAS BEEN MOVED TO 02-27-17 - ARRIVAL TIME - 10:30 AM , PT. TO BE NPO AFTER MIDNIGHT, SPOKE WITH PATIENT'S WIFE AND SHE IS AWARE OF THE TEST CHANGE.

## 2017-02-27 ENCOUNTER — Ambulatory Visit
Admission: RE | Admit: 2017-02-27 | Discharge: 2017-02-27 | Disposition: A | Discharge status: Home / Self Care | Payer: Medicare Other | Source: Ambulatory Visit | Attending: Radiation Oncology | Admitting: Radiation Oncology

## 2017-02-27 ENCOUNTER — Encounter: Payer: Self-pay | Admitting: Radiation Oncology

## 2017-02-27 ENCOUNTER — Encounter (HOSPITAL_COMMUNITY)
Admission: RE | Admit: 2017-02-27 | Discharge: 2017-02-27 | Disposition: A | Discharge status: Home / Self Care | Payer: Medicare Other | Source: Ambulatory Visit | Attending: Oncology | Admitting: Oncology

## 2017-02-27 DIAGNOSIS — M1990 Unspecified osteoarthritis, unspecified site: Secondary | ICD-10-CM | POA: Diagnosis not present

## 2017-02-27 DIAGNOSIS — I2510 Atherosclerotic heart disease of native coronary artery without angina pectoris: Secondary | ICD-10-CM | POA: Diagnosis not present

## 2017-02-27 DIAGNOSIS — C4442 Squamous cell carcinoma of skin of scalp and neck: Secondary | ICD-10-CM | POA: Insufficient documentation

## 2017-02-27 DIAGNOSIS — Z510 Encounter for antineoplastic radiation therapy: Secondary | ICD-10-CM | POA: Diagnosis present

## 2017-02-27 DIAGNOSIS — Z79899 Other long term (current) drug therapy: Secondary | ICD-10-CM | POA: Diagnosis not present

## 2017-02-27 DIAGNOSIS — K5790 Diverticulosis of intestine, part unspecified, without perforation or abscess without bleeding: Secondary | ICD-10-CM | POA: Diagnosis not present

## 2017-02-27 DIAGNOSIS — J449 Chronic obstructive pulmonary disease, unspecified: Secondary | ICD-10-CM | POA: Diagnosis not present

## 2017-02-27 DIAGNOSIS — K219 Gastro-esophageal reflux disease without esophagitis: Secondary | ICD-10-CM | POA: Diagnosis not present

## 2017-02-27 DIAGNOSIS — Z87891 Personal history of nicotine dependence: Secondary | ICD-10-CM | POA: Diagnosis not present

## 2017-02-27 DIAGNOSIS — C773 Secondary and unspecified malignant neoplasm of axilla and upper limb lymph nodes: Secondary | ICD-10-CM

## 2017-02-27 DIAGNOSIS — Z8546 Personal history of malignant neoplasm of prostate: Secondary | ICD-10-CM | POA: Diagnosis not present

## 2017-02-27 DIAGNOSIS — F0390 Unspecified dementia without behavioral disturbance: Secondary | ICD-10-CM | POA: Diagnosis not present

## 2017-02-27 DIAGNOSIS — E785 Hyperlipidemia, unspecified: Secondary | ICD-10-CM | POA: Diagnosis not present

## 2017-02-27 DIAGNOSIS — C779 Secondary and unspecified malignant neoplasm of lymph node, unspecified: Secondary | ICD-10-CM

## 2017-02-27 LAB — GLUCOSE, CAPILLARY: Glucose-Capillary: 107 mg/dL — ABNORMAL HIGH (ref 65–99)

## 2017-02-27 MED ORDER — FLUDEOXYGLUCOSE F - 18 (FDG) INJECTION
8.4700 | Freq: Once | INTRAVENOUS | Status: AC | PRN
  Administered 2017-02-27: 8.47 via INTRAVENOUS

## 2017-02-27 NOTE — Progress Notes (Signed)
Location of  Cancer: Left axilla (Metastatic squamous cell carcinoma)   Histology per Pathology Report: Diagnosis 1.03/2017 Dr. Armandina Gemma, , MD  Followed up after d/c  From [facility] place, (rehab from leg) Lymph node for lymphoma - METASTATIC SQUAMOUS CELL CARCINOMA.      Past/Anticipated interventions by surgeon, if any::  Right Femur IN NAIL  Dr, Paralee Cancel, MD, 12/13/16 from fall  Scalp lesion  Excision 2017  Past/Anticipated interventions by medical oncology, if any: Chemotherapy : Dr. Corrin Parker 02/14/17, ordered Pet scan,03/01/17    Pain issues, if any: Right knee SAFETY ISSUES:  Prior radiation?Yes , 1993 prostate cancer, 3800  Radiation txs  On 05/22/1992, Dr. Noreene Filbert, MD [place] [facility]  Is the patient on methotrexate? NO   Current Complaints / other details:  Married,, dementia, Hx Prostate cancer, /prostatectomy, quit smoking 1968 COPD, CAD,lithotripsy 1996  BP (!) 151/67 (BP Location: Left Arm, Patient Position: Sitting, Cuff Size: Normal)   Pulse 67   Temp 98 F (36.7 C) (Oral)   Resp 18   Ht '5\' 9"'$  (1.753 m)   Wt 170 lb 6.4 oz (77.3 kg)   SpO2 99% Comment: room air  BMI 25.16 kg/m   Wt Readings from Last 3 Encounters:  02/27/17 170 lb 6.4 oz (77.3 kg)  02/14/17 170 lb 1.6 oz (77.2 kg)  01/30/17 167 lb 6.4 oz (75.9 kg)   Rebecca Eaton, RN 02/27/2017,3:41 PM

## 2017-02-27 NOTE — Progress Notes (Signed)
Radiation Oncology         ([phone]) [phone] ________________________________  Name: [name] MRN: [id number]  Date: 02/27/2017  DOB: Jul 11, 1930  GQ:[id number],KDTOI KOTVAN, MD  Wyatt Portela, MD     REFERRING PHYSICIAN: Wyatt Portela, MD   DIAGNOSIS: The encounter diagnosis was Metastatic cancer to axillary lymph nodes ([place]).  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 male seen at the request of Dr. Alen Blew and Dr. Harlow Asa for a diagnosis of metastatic squamous cell carcinoma. The patient has a history of prostate cancer that has been in remission since definitive radiation therapy in 1993. The patient also has a history of multiple squamous cell carcinoma skin lesions. On 02/07/2014, he underwent shave biopsy of the skin of the left frontal scalp revealing well differentiated squamous cell carcinoma. On 05/23/2014, shave removal of the skin of the posterior right scalp revealed seborrehic keratosis, irritated. He subsequently had a Mohs procedure at the [facility]. On 08/11/2015, he underwent shave removal of skin in the left upper chest below the clavicle and this revealed moderately differentiated quamous cell carcinoma, ulcerated.  The patient started developing left axillary enlargement which he self palpated in December 2017, and was evaluated by Dr. Harlow Asa earlier this year.  Excision of a left axillary lymph node on 12/08/2016 showed metastatic squamous cell carcinoma. Previous imaging studies including a CT scan in November 2016 was unremarkable. He also had a chest x-ray on 12/12/2016 that was unremarkable. The patient was subsequently referred to medical oncology as well as radiation oncology for evaluation. Prior to his evaluation he sustained a fall and required intramedullary fixation of a femoral fracture as well as ORIF of the right hip on 12/13/2016. The patient feels that he has recovered to the point to  pursue treatment. The patient saw Dr. Alen Blew on 02/14/17 who recommended PET scan for staging workup. PET scan performed earlier today showed a hypermetabolic left axillary lymph node consistent with the reports metastatic squamous cell carcinoma, a small right lower lobe pulmonary nodule measured 33m too small for PET affinity. No additional evidence of metastatic disease (including the scalp) was noted.  The patient presents today with his wife to discuss the role of radiation for the management of his disease.  PREVIOUS RADIATION THERAPY: Yes.   1993: Treatment dose unknown though he reports definitive radiotherapy to the Prostate by Dr. GNoreene Filbert   PAST MEDICAL HISTORY:  Past Medical History:  Diagnosis Date  . Adenomatous colon polyp   . Anemia   . Arthritis   . CAD (coronary artery disease)    s/p Taxus DES to prox and mid LAD 06/2003;  LHC  1/07: LM 205, LAD stents ok., oD2 70% jailed (tx conservatively), mid to dist RCA 40-50%, EF 40-45%;  echo 2/07: EF 55-65%  . COPD (chronic obstructive pulmonary disease) ([place]   . Dermatitis   . Diverticulosis of colon   . GERD (gastroesophageal reflux disease)   . Hemorrhoids   . Hiatal hernia   . History of prostate cancer   . History of renal calculi   . Hyperglycemia   . Hyperlipidemia   . Hypothyroidism   . Migraines   . Osteoarthritis   . Prostate cancer ([place]   . Transfusion history        PAST SURGICAL HISTORY: Past Surgical History:  Procedure Laterality Date  . APPENDECTOMY    . broken knee cap    .  broken wrist    . COLONOSCOPY  2005  . CORONARY STENT PLACEMENT     2004  . FEMUR IM NAIL Right 12/13/2016   Procedure: INTRAMEDULLARY (IM) NAIL FEMORAL;  Surgeon: Paralee Cancel, MD;  Location: [facility];  Service: Orthopedics;  Laterality: Right;  . left knee replacement    . LITHOTRIPSY  1996  . PROSTATECTOMY    . radiation for prostate    . RETINAL DETACHMENT SURGERY    . right knee replacement     x 2  . TOTAL  KNEE ARTHROPLASTY  2001/2002     FAMILY HISTORY:  Family History  Problem Relation Age of Onset  . Hypertension Other   . Arthritis Other   . Cancer Other   . Rheum arthritis Daughter      SOCIAL HISTORY:  reports that he has quit smoking. He has never used smokeless tobacco. He reports that he does not drink alcohol or use drugs. The patient is married and lives in [place].   ALLERGIES: Aspirin; Meperidine hcl; and Pravastatin   MEDICATIONS:  Current Outpatient Prescriptions  Medication Sig Dispense Refill  . calcium-vitamin D (OSCAL WITH D) 500-200 MG-UNIT tablet Take 1 tablet by mouth 2 (two) times daily.    . clobetasol (TEMOVATE) 0.05 % external solution APPLY TO AFFECTED AREA TWICE A DAY ON SCALP AS NEEDED (NOT TO FACE)  2  . donepezil (ARICEPT) 10 MG tablet Take 1 tablet (10 mg total) by mouth at bedtime. 90 tablet 3  . HYDROcodone-acetaminophen (NORCO) 10-325 MG tablet Take half a tablet every 5 hours as needed 60 tablet 0  . Multiple Vitamins-Minerals (OCUVITE PRESERVISION) TABS Take 1 tablet by mouth 2 (two) times daily.      . diclofenac sodium (VOLTAREN) 1 % GEL Apply 1 application topically as needed.  0   No current facility-administered medications for this encounter.      REVIEW OF SYSTEMS: On review of systems, the patient reports that he is doing well overall. He denies any chest pain, shortness of breath, cough, fevers, chills, night sweats, unintended weight changes. He denies any bowel or bladder disturbances, and denies abdominal pain, nausea or vomiting. He denies any new musculoskeletal or joint aches or pains. He denies lymphedema of his left arm or feeling a lump in his left axilla anymore. He does report an itchy scalp. A complete review of systems is obtained and is otherwise negative.    PHYSICAL EXAM:  Wt Readings from Last 3 Encounters:  02/27/17 170 lb 6.4 oz (77.3 kg)  02/14/17 170 lb 1.6 oz (77.2 kg)  01/30/17 167 lb 6.4 oz (75.9 kg)    Temp Readings from Last 3 Encounters:  02/27/17 98 F (36.7 C) (Oral)  02/14/17 98.4 F (36.9 C) (Oral)  01/30/17 97.6 F (36.4 C) (Oral)   BP Readings from Last 3 Encounters:  02/27/17 (!) 151/67  02/14/17 (!) 144/64  01/30/17 (!) 170/90   Pulse Readings from Last 3 Encounters:  02/27/17 67  02/14/17 70  01/30/17 89   Pain Assessment Pain Score: 4  Pain Loc: Knee (right)/10  In general this is a well appearing elderly Caucasian male in no acute distress. He is alert and oriented x4 and appropriate throughout the examination. HEENT reveals that the patient is normocephalic, atraumatic. Hearing aids in place. EOMs are intact. PERRLA. Skin of the mildine of the base of the forehead scalp notable for a 5 mm lesion that is somewhat ulcerated and crusting. Cardiovascular exam reveals a regular  rate and rhythm, no clicks rubs or murmurs are auscultated. Chest is clear to auscultation bilaterally. Lymphatic assessment is performed and does not reveal any adenopathy in the cervical, supraclavicular or axillary chains a well healed incision site is noted in the left axilla, without palpable fullness or chest wall edema. Abdomen has active bowel sounds in all quadrants and is intact. The abdomen is soft, non tender, non distended. Lower extremities are negative for pretibial pitting edema, deep calf tenderness, cyanosis or clubbing. Left upper extremity is negative for edema.  ECOG = 1  0 - Asymptomatic (Fully active, able to carry on all predisease activities without restriction)  1 - Symptomatic but completely ambulatory (Restricted in physically strenuous activity but ambulatory and able to carry out work of a light or sedentary nature. For example, light housework, office work)  2 - Symptomatic, <50% in bed during the day (Ambulatory and capable of all self care but unable to carry out any work activities. Up and about more than 50% of waking hours)  3 - Symptomatic, >50% in bed, but  not bedbound (Capable of only limited self-care, confined to bed or chair 50% or more of waking hours)  4 - Bedbound (Completely disabled. Cannot carry on any self-care. Totally confined to bed or chair)  5 - Death   Eustace Pen MM, Creech RH, Tormey DC, et al. [phone]). "Toxicity and response criteria of the [facility] Group". [place] Oncol. 5 (6): 649-55    LABORATORY DATA:  Lab Results  Component Value Date   WBC 5.6 01/30/2017   HGB 13.7 01/30/2017   HCT 41.2 01/30/2017   MCV 88.4 01/30/2017   PLT 223.0 01/30/2017   Lab Results  Component Value Date   NA 142 01/30/2017   K 4.3 01/30/2017   CL 103 01/30/2017   CO2 32 01/30/2017   Lab Results  Component Value Date   ALT 9 09/14/2016   AST 12 09/14/2016   ALKPHOS 94 09/14/2016   BILITOT 0.8 09/14/2016      RADIOGRAPHY: Nm Pet Image Initial (pi) Skull Base To Thigh  Result Date: 02/27/2017 CLINICAL DATA:  Initial treatment strategy for squamous cell carcinoma of the scalp. Recent LEFT axillary excisional biopsy revealed squamous cell carcinoma metastatic. EXAM: NUCLEAR MEDICINE PET SKULL BASE TO THIGH TECHNIQUE: 8.4 mCi F-18 FDG was injected intravenously. Full-ring PET imaging was performed from the skull base to thigh after the radiotracer. CT data was obtained and used for attenuation correction and anatomic localization. FASTING BLOOD GLUCOSE:  Value: 107 mg/dl COMPARISON:  11/24/2016 FINDINGS: NECK No hypermetabolic lymph nodes in the neck. Entire scalp included. No cutaneous lesion. CHEST There is hypermetabolic activity associated with a LEFT axillary lymph node measuring 15 mm (image 97, series 4). A biopsy clip marks this node. The activity is intense with SUV max equal 8.3. No additional thoracic hypermetabolic lymph nodes are present. No intrathoracic mediastinal lymph nodes. No suspicious pulmonary nodules. Calcified granuloma in the RIGHT lower lobe. Small noncalcified nodule in the RIGHT lower lobe  along the fissure measuring 4 mm (image 98, series 4. ABDOMEN/PELVIS No abnormal metabolic activity in the liver liver. Adrenal glands are normal. Gallbladder normal with 2 large gallstones measuring 2 cm each. Spleen normal. No hypermetabolic abdominopelvic lymph nodes. Atherosclerotic calcification of the aorta. Prostatectomy with nodal dissection clips noted in the pelvis. SKELETON Fractures within the RIGHT hemipelvis and hip with internal fixation of the hip fracture. No aggressive osseous lesion. IMPRESSION: 1. Hypermetabolic LEFT axillary lymph  node consistent with reported metastatic squamous cell carcinoma. 2. No additional evidence of metastatic squamous cell carcinoma on skullbase to thigh scan including scalp. 3. Small RIGHT lower lobe pulmonary nodule. 4. No evidence of local prostate cancer recurrence in the prostate bed. Electronically Signed   By: Suzy Bouchard M.D.   On: 02/27/2017 12:42       IMPRESSION/PLAN: 1. 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 The most likely source of his original primary might have been the left upper chest for which he had confirmed squamous cell carcinoma on a shave biopsy. Dr. Lisbeth Renshaw reviewed his previous pathology, and PET imaging results with the patient and his wife. After discussing the findings, as there is only what appears to be localized disease,  Dr. Lisbeth Renshaw recommends contacting Dr. Harlow Asa to determine if he feels there is a role for additional surgical resection. If not, Dr. Lisbeth Renshaw would offer SBRT to the left axilla in 5 fractions. I have left a message with Dr. Gala Lewandowsky office to discuss this further, and will be in touch with the patient with his recommendations. I will keep Dr. Alen Blew informed of this as well, as the patient will return for further discussion with him on 03/10/17.We did however review the risks,  benefits, short, and long term effects of radiotherapy, and Dr. Lisbeth Renshaw discusses the delivery and logistics of radiotherapy. Further planning for radiation will be discussed after discussion with Dr. Harlow Asa.  2.  Prostate Cancer. The patient continues to be clinically NED, and will continue to follow up with Dr. Jeris Penta for his PSAs.  The above documentation reflects my direct findings during this shared patient visit. Please see the separate note by Dr. Lisbeth Renshaw on this date for the remainder of the patient's plan of care.    Carola Rhine, PAC  This document serves as a record of services personally performed by Shona Simpson, PA-C and Kyung Rudd, MD. It was created on their behalf by Darcus Austin, a trained medical scribe. The creation of this record is based on the scribe's personal observations and the providers' statements to them. This document has been checked and approved by the attending provider.

## 2017-02-27 NOTE — Progress Notes (Signed)
Please see the Nurse Progress Note in the MD Initial Consult Encounter for this patient. 

## 2017-02-28 ENCOUNTER — Telehealth: Payer: Self-pay | Admitting: Radiation Oncology

## 2017-02-28 NOTE — Telephone Encounter (Signed)
I called the patient to let him know I spoke with Dr. Harlow Asa and due to age and comorbidities, he does not recommend reexcision of the nodes. I will discuss this with Dr. Lisbeth Renshaw and let the patient know recommendations. We briefly discussed 5 fractions of SBRT, so I will confirm this with Dr. Lisbeth Renshaw and let the patient and Dr. Alen Blew know.

## 2017-03-01 ENCOUNTER — Encounter (HOSPITAL_COMMUNITY): Payer: Medicare Other

## 2017-03-02 ENCOUNTER — Ambulatory Visit
Admission: RE | Admit: 2017-03-02 | Discharge: 2017-03-02 | Disposition: A | Discharge status: Home / Self Care | Payer: Medicare Other | Source: Ambulatory Visit | Attending: Radiation Oncology | Admitting: Radiation Oncology

## 2017-03-02 ENCOUNTER — Telehealth: Payer: Self-pay | Admitting: Radiation Oncology

## 2017-03-02 ENCOUNTER — Ambulatory Visit: Payer: Medicare Other

## 2017-03-02 NOTE — Telephone Encounter (Signed)
I called and spoke with the patient and his wife about the role of SBRT and that Dr. Ida Rogue recommended 5 fractions as surgery is not recommended by Dr. Harlow Asa. He will see Dr. Alen Blew next week as well and I will copy him on this note. We will ask the patient to come for simulation the week of 4/9.

## 2017-03-10 ENCOUNTER — Ambulatory Visit (HOSPITAL_BASED_OUTPATIENT_CLINIC_OR_DEPARTMENT_OTHER): Payer: Medicare Other | Admitting: Oncology

## 2017-03-10 ENCOUNTER — Telehealth: Payer: Self-pay | Admitting: Oncology

## 2017-03-10 ENCOUNTER — Other Ambulatory Visit: Payer: Medicare Other

## 2017-03-10 VITALS — BP 151/74 | HR 73 | Temp 98.0°F | Resp 17 | Ht 69.0 in | Wt 169.8 lb

## 2017-03-10 DIAGNOSIS — C61 Malignant neoplasm of prostate: Secondary | ICD-10-CM | POA: Diagnosis not present

## 2017-03-10 DIAGNOSIS — C773 Secondary and unspecified malignant neoplasm of axilla and upper limb lymph nodes: Secondary | ICD-10-CM

## 2017-03-10 DIAGNOSIS — C4492 Squamous cell carcinoma of skin, unspecified: Secondary | ICD-10-CM | POA: Diagnosis not present

## 2017-03-10 NOTE — Progress Notes (Signed)
Hematology and Oncology Follow Up Visit  [name] [id number] 1930/03/27 83 y.o. 03/10/2017 9:41 AM [name], MDPanosh, Derrick Brooking, MD   Principle Diagnosis: 83 year old gentleman with metastatic squamous cell carcinoma to the left axilla diagnosed in January 2018. He presented with a left axillary mass with biopsy proven to be squamous cell carcinoma likely metastatic from a scalp lesion. PET CT scan in March 2018 showed no distant metastasis.   Prior Therapy: He is status post excisional biopsy done on 12/08/2016 of his left axillary mass which showed squamous cell carcinoma.  Current therapy: Under evaluation for radiation therapy.  Interim History: [name] presents today for a follow-up visit. Since the last visit, he completed his staging workup including a PET/CT scan and reports no recent complaints. He still ambulating with the help of a walker and does not report any falls or syncope. He has not reported any new skin lesions. He still palpates a mass under his left axilla. His performance status still limited but not dramatically different. His appetite is reasonable and has recovered from his hip fracture as well.  He does not report any headaches, blurry vision, syncope or seizures. He is not reporting any fevers, chills or sweats. He does not report any cough, wheezing or hemoptysis. He does not report any nausea, vomiting or abdominal pain. He is not reporting frequency urgency or hesitancy. He does not report any skeletal complaints of arthralgias or myalgias. Remaining review of systems unremarkable.   Medications: I have reviewed the patient's current medications.  Current Outpatient Prescriptions  Medication Sig Dispense Refill  . calcium-vitamin D (OSCAL WITH D) 500-200 MG-UNIT tablet Take 1 tablet by mouth 2 (two) times daily.    . clobetasol (TEMOVATE) 0.05 % external solution APPLY TO AFFECTED AREA TWICE A DAY ON SCALP AS NEEDED (NOT TO FACE)  2  . diclofenac  sodium (VOLTAREN) 1 % GEL Apply 1 application topically as needed.  0  . donepezil (ARICEPT) 10 MG tablet Take 1 tablet (10 mg total) by mouth at bedtime. 90 tablet 3  . HYDROcodone-acetaminophen (NORCO) 10-325 MG tablet Take half a tablet every 5 hours as needed 60 tablet 0  . Multiple Vitamins-Minerals (OCUVITE PRESERVISION) TABS Take 1 tablet by mouth 2 (two) times daily.       No current facility-administered medications for this visit.      Allergies:  Allergies  Allergen Reactions  . Aspirin Other (See Comments)    Daily use causes bleeding in the bladder.  Can take occasionally  . Meperidine Hcl Other (See Comments)    REACTION: combative  . Pravastatin Other (See Comments)    ? Memory deficits improved when went off med    Past Medical History, Surgical history, Social history, and Family History were reviewed and updated.  Physical Exam: Blood pressure (!) 151/74, pulse 73, temperature 98 F (36.7 C), temperature source Oral, resp. rate 17, height '5\' 9"'$  (1.753 m), weight 169 lb 12.8 oz (77 kg), SpO2 97 %. ECOG: 1 General appearance: alert and cooperative Head: Normocephalic, without obvious abnormality Neck: no adenopathy Lymph nodes: Cervical, supraclavicular, and axillary nodes normal. Heart:regular rate and rhythm, S1, S2 normal, no murmur, click, rub or gallop Lung:chest clear, no wheezing, rales, normal symmetric air entry. Abdomin: soft, non-tender, without masses or organomegaly EXT:no erythema, induration, or nodules   Lab Results: Lab Results  Component Value Date   WBC 5.6 01/30/2017   HGB 13.7 01/30/2017   HCT 41.2 01/30/2017   MCV 88.4  01/30/2017   PLT 223.0 01/30/2017     Chemistry      Component Value Date/Time   NA 142 01/30/2017 1147   NA 145 12/20/2016   K 4.3 01/30/2017 1147   CL 103 01/30/2017 1147   CO2 32 01/30/2017 1147   BUN 15 01/30/2017 1147   BUN 24 (A) 12/20/2016   CREATININE 0.87 01/30/2017 1147   GLU 105 12/20/2016       Component Value Date/Time   CALCIUM 9.4 01/30/2017 1147   ALKPHOS 94 09/14/2016 0837   AST 12 09/14/2016 0837   ALT 9 09/14/2016 0837   BILITOT 0.8 09/14/2016 0837       Radiological Studies: EXAM: NUCLEAR MEDICINE PET SKULL BASE TO THIGH  TECHNIQUE: 8.4 mCi F-18 FDG was injected intravenously. Full-ring PET imaging was performed from the skull base to thigh after the radiotracer. CT data was obtained and used for attenuation correction and anatomic localization.  FASTING BLOOD GLUCOSE:  Value: 107 mg/dl  COMPARISON:  11/24/2016  FINDINGS: NECK  No hypermetabolic lymph nodes in the neck. Entire scalp included. No cutaneous lesion.  CHEST  There is hypermetabolic activity associated with a LEFT axillary lymph node measuring 15 mm (image 97, series 4). A biopsy clip marks this node. The activity is intense with SUV max equal 8.3.  No additional thoracic hypermetabolic lymph nodes are present. No intrathoracic mediastinal lymph nodes. No suspicious pulmonary nodules. Calcified granuloma in the RIGHT lower lobe.  Small noncalcified nodule in the RIGHT lower lobe along the fissure measuring 4 mm (image 98, series 4.  ABDOMEN/PELVIS  No abnormal metabolic activity in the liver liver. Adrenal glands are normal.  Gallbladder normal with 2 large gallstones measuring 2 cm each. Spleen normal. No hypermetabolic abdominopelvic lymph nodes.  Atherosclerotic calcification of the aorta.  Prostatectomy with nodal dissection clips noted in the pelvis.  SKELETON  Fractures within the RIGHT hemipelvis and hip with internal fixation of the hip fracture. No aggressive osseous lesion.  IMPRESSION: 1. Hypermetabolic LEFT axillary lymph node consistent with reported metastatic squamous cell carcinoma. 2. No additional evidence of metastatic squamous cell carcinoma on skullbase to thigh scan including scalp. 3. Small RIGHT lower lobe pulmonary nodule. 4. No  evidence of local prostate cancer recurrence in the prostate bed.   Impression and Plan:   83 year old gentleman with the following issues:  1. 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 He presented with a palpated left axillary mass that has been removed surgically on 12/08/2016. The pathology confirmed the presence of metastatic squamous cell carcinoma. 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  PET CT scan obtained on 02/27/2017 was personally reviewed which showed persistent left axillary lymph node concerning for metastatic squamous cell carcinoma. No additional systemic disease noted at this time.  Based on these findings, I do not recommend any systemic therapy and recommend proceeding with local treatment with radiation therapy. Ideally lymph node dissection followed by radiation therapy would be approach but given his age surgical dissection of his axilla might be causing low morbidity at this time. He understands that systemic therapy might be needed in the future he develops metastatic disease. He would be a marginal candidate at best but consideration for immune therapy could be made at that time.   2. Prostate cancer: He is status post a definitive radiation therapy without any evidence of recurrent disease. He  follows with Dr. Jeffie Pollock in his most recent PSA have been in October 2017 nearly undetectable  3. Follow-up: Will be in 4 months and potentially repeating his scan in 8 months.    [facility], MD 4/6/20189:41 AM

## 2017-03-10 NOTE — Telephone Encounter (Signed)
Gave patient AVS and calender per 03/10/2017 los.

## 2017-03-14 ENCOUNTER — Other Ambulatory Visit: Payer: Medicare Other

## 2017-03-14 ENCOUNTER — Other Ambulatory Visit: Payer: Self-pay | Admitting: Internal Medicine

## 2017-03-14 ENCOUNTER — Ambulatory Visit: Payer: Medicare Other | Admitting: Oncology

## 2017-03-14 NOTE — Telephone Encounter (Signed)
Pt request refill  °HYDROcodone-acetaminophen (NORCO) 10-325 MG tablet °

## 2017-03-16 ENCOUNTER — Ambulatory Visit: Admission: RE | Admit: 2017-03-16 | Payer: Medicare Other | Source: Ambulatory Visit | Admitting: Radiation Oncology

## 2017-03-16 ENCOUNTER — Ambulatory Visit
Admission: RE | Admit: 2017-03-16 | Discharge: 2017-03-16 | Disposition: A | Discharge status: Home / Self Care | Payer: Medicare Other | Source: Ambulatory Visit | Attending: Radiation Oncology | Admitting: Radiation Oncology

## 2017-03-16 DIAGNOSIS — Z510 Encounter for antineoplastic radiation therapy: Secondary | ICD-10-CM | POA: Diagnosis not present

## 2017-03-16 DIAGNOSIS — C773 Secondary and unspecified malignant neoplasm of axilla and upper limb lymph nodes: Secondary | ICD-10-CM

## 2017-03-17 ENCOUNTER — Other Ambulatory Visit: Payer: Self-pay

## 2017-03-17 MED ORDER — HYDROCODONE-ACETAMINOPHEN 10-325 MG PO TABS: ORAL_TABLET | ORAL | 0 refills | Status: DC

## 2017-03-17 NOTE — Telephone Encounter (Signed)
Per Dr. Regis Bill she does want pt to try and decrease dosage. Rx written, signed and given to pt's wife. Nothing further needed.

## 2017-03-17 NOTE — Telephone Encounter (Signed)
Tell patient  I want  Him to decrease dose of medication because of  Increase risks of  This medication  With age . ( memory, falling  Etc )   Decrease to  Every 8 hours    1/2  tab  As needed   rrefill  Disp  50

## 2017-03-17 NOTE — Telephone Encounter (Signed)
Spoke with pt. He states that he currently takes 1/2 tab Norco 4 times daily. He does not want to change this regimen as he reports he is in constant pain without it. Per Oncology OV note he denies any pain. He would like rx to remain the same.   Dr. Regis Bill - please advise. Thanks!

## 2017-03-23 DIAGNOSIS — Z510 Encounter for antineoplastic radiation therapy: Secondary | ICD-10-CM | POA: Diagnosis not present

## 2017-03-27 ENCOUNTER — Ambulatory Visit: Payer: Medicare Other

## 2017-03-27 ENCOUNTER — Ambulatory Visit (INDEPENDENT_AMBULATORY_CARE_PROVIDER_SITE_OTHER): Payer: Medicare Other | Admitting: Family Medicine

## 2017-03-27 ENCOUNTER — Encounter: Payer: Self-pay | Admitting: Family Medicine

## 2017-03-27 ENCOUNTER — Ambulatory Visit
Admission: RE | Admit: 2017-03-27 | Discharge: 2017-03-27 | Disposition: A | Discharge status: Home / Self Care | Payer: Medicare Other | Source: Ambulatory Visit | Attending: Radiation Oncology | Admitting: Radiation Oncology

## 2017-03-27 VITALS — BP 130/72 | HR 78 | Temp 97.9°F | Wt 169.2 lb

## 2017-03-27 DIAGNOSIS — L989 Disorder of the skin and subcutaneous tissue, unspecified: Secondary | ICD-10-CM

## 2017-03-27 DIAGNOSIS — Z510 Encounter for antineoplastic radiation therapy: Secondary | ICD-10-CM | POA: Diagnosis not present

## 2017-03-27 NOTE — Progress Notes (Signed)
   Subjective:    Patient ID: [name], male    DOB: May 04, 1930, 83 y.o.   MRN: [id number]  HPI Here to check a lesion on the right lower leg that appeared a month or so ago. No hx of trauma. It itches at times and it may bleed if he scratches it.    Review of Systems  Constitutional: Negative.   Skin: Positive for wound.       Objective:   Physical Exam  Constitutional: He appears well-developed and well-nourished.  Skin:  Right lower anterior leg has a 1 cm flat papular lesion that appears to be quite vascularized           Assessment & Plan:  Skin lesion, likely a granuloma, that should be resected. We will refer [facility] to [facility].  Alysia Penna, MD

## 2017-03-27 NOTE — Progress Notes (Signed)
Pre visit review using our clinic review tool, if applicable. No additional management support is needed unless otherwise documented below in the visit note. 

## 2017-03-27 NOTE — Patient Instructions (Signed)
WE NOW OFFER   [facility] FAST TRACK!!!  SAME DAY Appointments for ACUTE CARE  Such as: Sprains, Injuries, cuts, abrasions, rashes, muscle pain, joint pain, back pain Colds, flu, sore throats, headache, allergies, cough, fever  Ear pain, sinus and eye infections Abdominal pain, nausea, vomiting, diarrhea, upset stomach Animal/insect bites  3 Easy Ways to Schedule: Walk-In Scheduling Call in scheduling Mychart Sign-up: [email or web address]         

## 2017-03-28 ENCOUNTER — Ambulatory Visit
Admission: RE | Admit: 2017-03-28 | Discharge: 2017-03-28 | Disposition: A | Discharge status: Home / Self Care | Payer: Medicare Other | Source: Ambulatory Visit | Attending: Radiation Oncology | Admitting: Radiation Oncology

## 2017-03-28 ENCOUNTER — Ambulatory Visit: Payer: Medicare Other | Admitting: Internal Medicine

## 2017-03-28 ENCOUNTER — Ambulatory Visit: Payer: Medicare Other

## 2017-03-28 DIAGNOSIS — Z510 Encounter for antineoplastic radiation therapy: Secondary | ICD-10-CM | POA: Diagnosis not present

## 2017-03-29 ENCOUNTER — Ambulatory Visit
Admission: RE | Admit: 2017-03-29 | Discharge: 2017-03-29 | Disposition: A | Discharge status: Home / Self Care | Payer: Medicare Other | Source: Ambulatory Visit | Attending: Radiation Oncology | Admitting: Radiation Oncology

## 2017-03-29 ENCOUNTER — Ambulatory Visit: Payer: Medicare Other

## 2017-03-29 DIAGNOSIS — Z510 Encounter for antineoplastic radiation therapy: Secondary | ICD-10-CM | POA: Diagnosis not present

## 2017-03-30 ENCOUNTER — Ambulatory Visit
Admission: RE | Admit: 2017-03-30 | Discharge: 2017-03-30 | Disposition: A | Discharge status: Home / Self Care | Payer: Medicare Other | Source: Ambulatory Visit | Attending: Radiation Oncology | Admitting: Radiation Oncology

## 2017-03-30 ENCOUNTER — Ambulatory Visit: Payer: Medicare Other

## 2017-03-30 DIAGNOSIS — Z510 Encounter for antineoplastic radiation therapy: Secondary | ICD-10-CM | POA: Diagnosis not present

## 2017-03-31 ENCOUNTER — Ambulatory Visit
Admission: RE | Admit: 2017-03-31 | Discharge: 2017-03-31 | Disposition: A | Discharge status: Home / Self Care | Payer: Medicare Other | Source: Ambulatory Visit | Attending: Radiation Oncology | Admitting: Radiation Oncology

## 2017-03-31 ENCOUNTER — Ambulatory Visit: Payer: Medicare Other

## 2017-03-31 DIAGNOSIS — Z510 Encounter for antineoplastic radiation therapy: Secondary | ICD-10-CM | POA: Diagnosis not present

## 2017-03-31 DIAGNOSIS — C773 Secondary and unspecified malignant neoplasm of axilla and upper limb lymph nodes: Secondary | ICD-10-CM

## 2017-03-31 MED ORDER — BIAFINE EX EMUL
Freq: Once | CUTANEOUS | Status: AC
  Administered 2017-03-31: 14:00:00 via TOPICAL

## 2017-03-31 NOTE — Progress Notes (Signed)
  Radiation Oncology         [phone] ________________________________  Name: [name] MRN: [id number]  Date: 03/16/2017  DOB: 07-14-1930  SIMULATION AND TREATMENT PLANNING NOTE  DIAGNOSIS:     ICD-9-CM ICD-10-CM   1. 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  Identity was confirmed.  All relevant records and images related to the planned course of therapy were reviewed.   Written consent to proceed with treatment was confirmed which was freely given after reviewing the details related to the planned course of therapy had been reviewed with the patient.  Then, the patient was set-up in a stable reproducible  supine position for radiation therapy.  CT images were obtained.  Surface markings were placed.    Medically necessary complex treatment device(s) for immobilization:  Wing-board.   The CT images were loaded into the planning software.  Then the target and avoidance structures were contoured.  Treatment planning then occurred.  The radiation prescription was entered and confirmed.  A total of 4 complex treatment devices were fabricated which relate to the designed radiation treatment fields. Each of these customized fields/ complex treatment devices will be used on a daily basis during the radiation course. I have requested : 3D Simulation  I have requested a DVH of the following structures: target volume, lungs, spinal cord.   PLAN:  The patient will receive 50 Gy in 25 fractions Initially. The patient then will receive a 10 grays boost to yield a final dose of 60 gray..   ________________________________   Jodelle Gross, MD, PhD

## 2017-03-31 NOTE — Progress Notes (Signed)
Pt here for patient teaching.  Pt given Radiation and You booklet and biafine cream.  Reviewed areas of pertinence such as fatigue and skin changes . Pt able to give teach back of applying biafine BID, to pat skin and use unscented/gentle soap,avoid applying anything to skin within 4 hours of treatment. Pt demonstrated understanding and verbalizes understanding of information given and will contact nursing with any questions or concerns.

## 2017-04-03 ENCOUNTER — Ambulatory Visit
Admission: RE | Admit: 2017-04-03 | Discharge: 2017-04-03 | Disposition: A | Discharge status: Home / Self Care | Payer: Medicare Other | Source: Ambulatory Visit | Attending: Radiation Oncology | Admitting: Radiation Oncology

## 2017-04-03 ENCOUNTER — Ambulatory Visit (INDEPENDENT_AMBULATORY_CARE_PROVIDER_SITE_OTHER): Payer: Medicare Other | Admitting: Neurology

## 2017-04-03 ENCOUNTER — Encounter: Payer: Self-pay | Admitting: Neurology

## 2017-04-03 VITALS — BP 130/66 | HR 83 | Temp 98.0°F | Ht 69.0 in | Wt 169.0 lb

## 2017-04-03 DIAGNOSIS — Z510 Encounter for antineoplastic radiation therapy: Secondary | ICD-10-CM | POA: Diagnosis not present

## 2017-04-03 DIAGNOSIS — F0390 Unspecified dementia without behavioral disturbance: Secondary | ICD-10-CM | POA: Diagnosis not present

## 2017-04-03 DIAGNOSIS — F03A0 Unspecified dementia, mild, without behavioral disturbance, psychotic disturbance, mood disturbance, and anxiety: Secondary | ICD-10-CM

## 2017-04-03 MED ORDER — DONEPEZIL HCL 10 MG PO TABS: 10.0000 mg | ORAL_TABLET | Freq: Every day | ORAL | 3 refills | Status: AC

## 2017-04-03 NOTE — Progress Notes (Signed)
NEUROLOGY FOLLOW UP OFFICE NOTE  [name] [id number]  HISTORY OF PRESENT ILLNESS: I had the pleasure of seeing [name] in follow-up in the neurology clinic on 04/03/2017. He is again accompanied by his wife and daughter who help supplement the history today. The patient was last seen 6 months ago for worsening memory suggestive of mild cognitive impairment. MMSE in October 2017 was 25/30. Aricept dose was increased to '10mg'$  daily. 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 PET scan did not show any distant metastasis. He is currently undergoing radiation therapy. His wife reports that he had a fall and fractured his right hip, and was confused for 3 weeks after. He was hallucinating, this has improved but he still says he sees things at night. He states he "sees things my whole life." last night he asked his wife if she had tied him to the bed. He sees people in the house, these are not frightening to him, he denies any auditory hallucinations. This keeps him awake though and messes up his sleep. Family reports that he continues to be in charge of his medications, he puts them in a pillbox and denies forgetting medications. He does not drive. They feel he is depressed, he has always been a people person and they feel he is bored. He has eye problems and has had difficulty reading now. He denies any headaches, dizziness, focal numbness/tingling.   HPI: This is a pleasant 83 yo RH man with a history of hyperlipidemia, hypertension, prostate cancer s/p radiation, who presented with a 2-year history of memory loss. He mostly has difficulty finding words to express himself and pulling up words. Because of this, he does not talk as much as he used to. Symptoms worsen when he gets frustrated. He feels he is taking so much medications that could be  affecting his thinking. He forgets names and conversations, occasionally misplaces things. His wife denies that he repeats himself. He denies getting lost driving. He denies missing medications or bill payments.  He denies any family history of dementia, however his brother had a brain tumor with memory changes, another brother has memory changes attributed to [employer].   Diagnostic Data: Lab Results  Component Value Date   TSH 1.14 09/14/2016   Lab Results  Component Value Date   [id number] 01/07/2015   I personally reviewed MRI brain without and witho contrast which did not show any acute changes, there was chronic microvascular disease in the bilateral hemispheres, pons, few old small vessel cerebellar infarctions.  PAST MEDICAL HISTORY: Past Medical History:  Diagnosis Date  . Adenomatous colon polyp   . Anemia   . Arthritis   . CAD (coronary artery disease)    s/p Taxus DES to prox and mid LAD 06/2003;  LHC  1/07: LM 205, LAD stents ok., oD2 70% jailed (tx conservatively), mid to dist RCA 40-50%, EF 40-45%;  echo 2/07: EF 55-65%  . COPD (chronic obstructive pulmonary disease) ([place])   . Dermatitis   . Diverticulosis of colon   . GERD (gastroesophageal reflux disease)   . Hemorrhoids   . Hiatal hernia   . History of prostate cancer   . History of renal calculi   . Hyperglycemia   . Hyperlipidemia   . Hypothyroidism   . Migraines   . Osteoarthritis   . Prostate  cancer ([place])   . Transfusion history     MEDICATIONS: Current Outpatient Prescriptions on File Prior to Visit  Medication Sig Dispense Refill  . calcium-vitamin D (OSCAL WITH D) 500-200 MG-UNIT tablet Take 1 tablet by mouth 2 (two) times daily.    . clobetasol (TEMOVATE) 0.05 % external solution APPLY TO AFFECTED AREA TWICE A DAY ON SCALP AS NEEDED (NOT TO FACE)  2  . diclofenac sodium (VOLTAREN) 1 % GEL Apply 1 application topically as needed.  0  . donepezil (ARICEPT) 10 MG tablet Take 1 tablet (10 mg  total) by mouth at bedtime. 90 tablet 3  . emollient (BIAFINE) cream Apply topically as needed.    Marland Kitchen HYDROcodone-acetaminophen (NORCO) 10-325 MG tablet Take half a tablet every 8 hours as needed 50 tablet 0  . Multiple Vitamins-Minerals (OCUVITE PRESERVISION) TABS Take 1 tablet by mouth 2 (two) times daily.       No current facility-administered medications on file prior to visit.     ALLERGIES: Allergies  Allergen Reactions  . Aspirin Other (See Comments)    Daily use causes bleeding in the bladder.  Can take occasionally  . Meperidine Hcl Other (See Comments)    REACTION: combative  . Pravastatin Other (See Comments)    ? Memory deficits improved when went off med    FAMILY HISTORY: Family History  Problem Relation Age of Onset  . Hypertension Other   . Arthritis Other   . Cancer Other   . Rheum arthritis Daughter     SOCIAL HISTORY: Social History   Social History  . Marital status: Married    Spouse name: N/A  . Number of children: N/A  . Years of education: N/A   Occupational History  . Retired Retired   Social History Main Topics  . Smoking status: Former Research scientist (life sciences)  . Smokeless tobacco: Never Used     Comment: Stopped in 1986  . Alcohol use No  . Drug use: No  . Sexual activity: Not on file   Other Topics Concern  . Not on file   Social History Narrative   No regular exercise   HHof 2   Married wife no pets       Remote hx of tobacco stopped 1965   No etoh   REtired Investment banker, corporate level.    REVIEW OF SYSTEMS: Constitutional: No fevers, chills, or sweats, no generalized fatigue, change in appetite Eyes: No visual changes, double vision, eye pain Ear, nose and throat: No hearing loss, ear pain, nasal congestion, sore throat Cardiovascular: No chest pain, palpitations Respiratory:  No shortness of breath at rest or with exertion, wheezes GastrointestinaI: No nausea, vomiting, diarrhea, abdominal pain, fecal incontinence Genitourinary:  No dysuria,  urinary retention or frequency Musculoskeletal:  No neck pain, back pain Integumentary: No rash, pruritus, skin lesions Neurological: as above Psychiatric: No depression, insomnia, anxiety Endocrine: No palpitations, fatigue, diaphoresis, mood swings, change in appetite, change in weight, increased thirst Hematologic/Lymphatic:  No anemia, purpura, petechiae. Allergic/Immunologic: no itchy/runny eyes, nasal congestion, recent allergic reactions, rashes  PHYSICAL EXAM: Vitals:   04/03/17 1520  BP: 130/66  Pulse: 83  Temp: 98 F (36.7 C)   General: No acute distress Head:  Normocephalic/atraumatic Neck: supple, no paraspinal tenderness, full range of motion Heart:  Regular rate and rhythm Lungs:  Clear to auscultation bilaterally Back: No paraspinal tenderness Skin/Extremities: No rash, no edema Neurological Exam: alert and oriented to person, place, and day of week/month/season. No aphasia or dysarthria. Fund  of knowledge is appropriate.  Remote memory intact. Attention and concentration are normal.    Able to name objects and repeat phrases.  MMSE - Mini Mental State Exam 04/03/2017 09/05/2016 10/09/2015  Orientation to time '3 5 5  '$ Orientation to Place '5 4 5  '$ Registration '3 3 3  '$ Attention/ Calculation '3 4 3  '$ Recall '1 2 2  '$ Language- name 2 objects '2 2 2  '$ Language- repeat '1 1 1  '$ Language- follow 3 step command '3 2 3  '$ Language- read & follow direction '1 1 1  '$ Write a sentence '1 1 1  '$ Copy design 0 0 1  Total score '23 25 27   '$ Cranial nerves:  CN II: pupils equal, round and reactive to light, visual fields intact, fundi unremarkable. CN III, IV, VI: full range of motion, no nystagmus, no ptosis CN V: facial sensation intact CN VII: upper and lower face symmetric CN VIII: hearing intact to finger rub CN IX, X: gag intact, uvula midline CN XI: sternocleidomastoid and trapezius muscles intact CN XII: tongue midline Bulk & Tone: normal, no cogwheeling, no fasciculations. Motor:  5/5 throughout except for 4/5 right hip flexion, no pronator drift. Sensation: decreased in both LE. No extinction to double simultaneous stimulation. Romberg test negative Deep Tendon Reflexes: +2 on both UE and left patella, +1 right patella, absent ankle jerks bilaterally, no ankle clonus Plantar responses: downgoing bilaterally Cerebellar: no incoordination on finger to nose Gait: slow and cautious due to right hip pain Tremor: none  IMPRESSION: This is an 83 yo RH man with hypertension, hyperlipidemia, prostate cancer s/p radiation, with memory changes. MMSE today 23/30, showing a decline from his last visit (25/30 in October 2017), suggestive of mild dementia. Continue Donepezil '10mg'$  daily. Family is reporting visual hallucinations and depression. We discussed starting an antidepressant, family is hesitant because he responded "weird" to antidepressants in the past. Low threshold for repeating MRI brain, we will proceed if symptoms worsen. We again discussed the importance of control of vascular risk factors, physical exercise and brain stimulation exercises for brain health. He will follow-up in 6 months and knows to call for any problems.   Thank you for allowing me to participate in his care.  Please do not hesitate to call for any questions or concerns.  The duration of this appointment visit was 25 minutes of face-to-face time with the patient.  Greater than 50% of this time was spent in counseling, explanation of diagnosis, planning of further management, and coordination of care.   Ellouise Newer, M.D.   CC: Dr. Regis Bill

## 2017-04-03 NOTE — Patient Instructions (Signed)
1. Continue Aricept '10mg'$  daily 2. Continue to monitor hallucinations and speech changes, hold off on MRI for now 3. Wishing you well with the rest of the radiation treatment 4. Follow-up in 6 months, call for any changes

## 2017-04-04 ENCOUNTER — Ambulatory Visit: Payer: Medicare Other

## 2017-04-04 ENCOUNTER — Ambulatory Visit
Admission: RE | Admit: 2017-04-04 | Discharge: 2017-04-04 | Disposition: A | Discharge status: Home / Self Care | Payer: Medicare Other | Source: Ambulatory Visit | Attending: Radiation Oncology | Admitting: Radiation Oncology

## 2017-04-04 DIAGNOSIS — Z510 Encounter for antineoplastic radiation therapy: Secondary | ICD-10-CM | POA: Diagnosis not present

## 2017-04-05 ENCOUNTER — Ambulatory Visit
Admission: RE | Admit: 2017-04-05 | Discharge: 2017-04-05 | Disposition: A | Discharge status: Home / Self Care | Payer: Medicare Other | Source: Ambulatory Visit | Attending: Radiation Oncology | Admitting: Radiation Oncology

## 2017-04-05 DIAGNOSIS — Z510 Encounter for antineoplastic radiation therapy: Secondary | ICD-10-CM | POA: Diagnosis not present

## 2017-04-06 ENCOUNTER — Ambulatory Visit
Admission: RE | Admit: 2017-04-06 | Discharge: 2017-04-06 | Disposition: A | Discharge status: Home / Self Care | Payer: Medicare Other | Source: Ambulatory Visit | Attending: Radiation Oncology | Admitting: Radiation Oncology

## 2017-04-06 ENCOUNTER — Ambulatory Visit: Payer: Medicare Other

## 2017-04-06 DIAGNOSIS — Z510 Encounter for antineoplastic radiation therapy: Secondary | ICD-10-CM | POA: Diagnosis not present

## 2017-04-07 ENCOUNTER — Ambulatory Visit
Admission: RE | Admit: 2017-04-07 | Discharge: 2017-04-07 | Disposition: A | Discharge status: Home / Self Care | Payer: Medicare Other | Source: Ambulatory Visit | Attending: Radiation Oncology | Admitting: Radiation Oncology

## 2017-04-07 DIAGNOSIS — Z510 Encounter for antineoplastic radiation therapy: Secondary | ICD-10-CM | POA: Diagnosis not present

## 2017-04-10 ENCOUNTER — Ambulatory Visit
Admission: RE | Admit: 2017-04-10 | Discharge: 2017-04-10 | Disposition: A | Discharge status: Home / Self Care | Payer: Medicare Other | Source: Ambulatory Visit | Attending: Radiation Oncology | Admitting: Radiation Oncology

## 2017-04-10 DIAGNOSIS — Z510 Encounter for antineoplastic radiation therapy: Secondary | ICD-10-CM | POA: Diagnosis not present

## 2017-04-11 ENCOUNTER — Ambulatory Visit
Admission: RE | Admit: 2017-04-11 | Discharge: 2017-04-11 | Disposition: A | Discharge status: Home / Self Care | Payer: Medicare Other | Source: Ambulatory Visit | Attending: Radiation Oncology | Admitting: Radiation Oncology

## 2017-04-11 ENCOUNTER — Encounter: Payer: Self-pay | Admitting: Neurology

## 2017-04-11 DIAGNOSIS — Z510 Encounter for antineoplastic radiation therapy: Secondary | ICD-10-CM | POA: Diagnosis not present

## 2017-04-11 DIAGNOSIS — F0390 Unspecified dementia without behavioral disturbance: Secondary | ICD-10-CM | POA: Insufficient documentation

## 2017-04-11 DIAGNOSIS — F03A0 Unspecified dementia, mild, without behavioral disturbance, psychotic disturbance, mood disturbance, and anxiety: Secondary | ICD-10-CM | POA: Insufficient documentation

## 2017-04-12 ENCOUNTER — Ambulatory Visit
Admission: RE | Admit: 2017-04-12 | Discharge: 2017-04-12 | Disposition: A | Discharge status: Home / Self Care | Payer: Medicare Other | Source: Ambulatory Visit | Attending: Radiation Oncology | Admitting: Radiation Oncology

## 2017-04-12 DIAGNOSIS — Z510 Encounter for antineoplastic radiation therapy: Secondary | ICD-10-CM | POA: Diagnosis not present

## 2017-04-13 ENCOUNTER — Ambulatory Visit
Admission: RE | Admit: 2017-04-13 | Discharge: 2017-04-13 | Disposition: A | Discharge status: Home / Self Care | Payer: Medicare Other | Source: Ambulatory Visit | Attending: Radiation Oncology | Admitting: Radiation Oncology

## 2017-04-13 DIAGNOSIS — Z510 Encounter for antineoplastic radiation therapy: Secondary | ICD-10-CM | POA: Diagnosis not present

## 2017-04-14 ENCOUNTER — Ambulatory Visit
Admission: RE | Admit: 2017-04-14 | Discharge: 2017-04-14 | Disposition: A | Discharge status: Home / Self Care | Payer: Medicare Other | Source: Ambulatory Visit | Attending: Radiation Oncology | Admitting: Radiation Oncology

## 2017-04-14 DIAGNOSIS — Z510 Encounter for antineoplastic radiation therapy: Secondary | ICD-10-CM | POA: Diagnosis not present

## 2017-04-17 ENCOUNTER — Ambulatory Visit
Admission: RE | Admit: 2017-04-17 | Discharge: 2017-04-17 | Disposition: A | Discharge status: Home / Self Care | Payer: Medicare Other | Source: Ambulatory Visit | Attending: Radiation Oncology | Admitting: Radiation Oncology

## 2017-04-17 DIAGNOSIS — Z510 Encounter for antineoplastic radiation therapy: Secondary | ICD-10-CM | POA: Diagnosis not present

## 2017-04-18 ENCOUNTER — Ambulatory Visit
Admission: RE | Admit: 2017-04-18 | Discharge: 2017-04-18 | Disposition: A | Discharge status: Home / Self Care | Payer: Medicare Other | Source: Ambulatory Visit | Attending: Radiation Oncology | Admitting: Radiation Oncology

## 2017-04-18 DIAGNOSIS — Z510 Encounter for antineoplastic radiation therapy: Secondary | ICD-10-CM | POA: Diagnosis not present

## 2017-04-19 ENCOUNTER — Ambulatory Visit
Admission: RE | Admit: 2017-04-19 | Discharge: 2017-04-19 | Disposition: A | Discharge status: Home / Self Care | Payer: Medicare Other | Source: Ambulatory Visit | Attending: Radiation Oncology | Admitting: Radiation Oncology

## 2017-04-19 DIAGNOSIS — Z510 Encounter for antineoplastic radiation therapy: Secondary | ICD-10-CM | POA: Diagnosis not present

## 2017-04-20 ENCOUNTER — Ambulatory Visit
Admission: RE | Admit: 2017-04-20 | Discharge: 2017-04-20 | Disposition: A | Discharge status: Home / Self Care | Payer: Medicare Other | Source: Ambulatory Visit | Attending: Radiation Oncology | Admitting: Radiation Oncology

## 2017-04-20 DIAGNOSIS — Z510 Encounter for antineoplastic radiation therapy: Secondary | ICD-10-CM | POA: Diagnosis not present

## 2017-04-21 ENCOUNTER — Ambulatory Visit
Admission: RE | Admit: 2017-04-21 | Discharge: 2017-04-21 | Disposition: A | Discharge status: Home / Self Care | Payer: Medicare Other | Source: Ambulatory Visit | Attending: Radiation Oncology | Admitting: Radiation Oncology

## 2017-04-21 DIAGNOSIS — Z510 Encounter for antineoplastic radiation therapy: Secondary | ICD-10-CM | POA: Diagnosis not present

## 2017-04-24 ENCOUNTER — Ambulatory Visit
Admission: RE | Admit: 2017-04-24 | Discharge: 2017-04-24 | Disposition: A | Discharge status: Home / Self Care | Payer: Medicare Other | Source: Ambulatory Visit | Attending: Radiation Oncology | Admitting: Radiation Oncology

## 2017-04-24 ENCOUNTER — Telehealth: Payer: Self-pay | Admitting: Internal Medicine

## 2017-04-24 DIAGNOSIS — Z510 Encounter for antineoplastic radiation therapy: Secondary | ICD-10-CM | POA: Diagnosis not present

## 2017-04-24 NOTE — Telephone Encounter (Signed)
Please advise 

## 2017-04-24 NOTE — Telephone Encounter (Signed)
I advise continue to wean this medication and keep appt for next week.   Can Refill 24  Pills  And try to decrease to  Prn   Every 12 hours    Will discuss at his OV next week.

## 2017-04-24 NOTE — Telephone Encounter (Signed)
Pt need new Rx for hydrocodone   Pt is aware of 3 business days for refills and someone will call when ready for pick up. °

## 2017-04-25 ENCOUNTER — Other Ambulatory Visit: Payer: Self-pay | Admitting: Emergency Medicine

## 2017-04-25 ENCOUNTER — Ambulatory Visit
Admission: RE | Admit: 2017-04-25 | Discharge: 2017-04-25 | Disposition: A | Discharge status: Home / Self Care | Payer: Medicare Other | Source: Ambulatory Visit | Attending: Radiation Oncology | Admitting: Radiation Oncology

## 2017-04-25 DIAGNOSIS — C773 Secondary and unspecified malignant neoplasm of axilla and upper limb lymph nodes: Secondary | ICD-10-CM

## 2017-04-25 DIAGNOSIS — Z510 Encounter for antineoplastic radiation therapy: Secondary | ICD-10-CM | POA: Diagnosis not present

## 2017-04-25 MED ORDER — BIAFINE EX EMUL
Freq: Once | CUTANEOUS | Status: AC
  Administered 2017-04-25: 11:00:00 via TOPICAL

## 2017-04-25 MED ORDER — HYDROCODONE-ACETAMINOPHEN 10-325 MG PO TABS: ORAL_TABLET | ORAL | 0 refills | Status: DC

## 2017-04-25 NOTE — Telephone Encounter (Signed)
Spoke to pt regarding rx being ready for pick up

## 2017-04-26 ENCOUNTER — Ambulatory Visit
Admission: RE | Admit: 2017-04-26 | Discharge: 2017-04-26 | Disposition: A | Discharge status: Home / Self Care | Payer: Medicare Other | Source: Ambulatory Visit | Attending: Radiation Oncology | Admitting: Radiation Oncology

## 2017-04-26 DIAGNOSIS — Z510 Encounter for antineoplastic radiation therapy: Secondary | ICD-10-CM | POA: Diagnosis not present

## 2017-04-27 ENCOUNTER — Ambulatory Visit
Admission: RE | Admit: 2017-04-27 | Discharge: 2017-04-27 | Disposition: A | Discharge status: Home / Self Care | Payer: Medicare Other | Source: Ambulatory Visit | Attending: Radiation Oncology | Admitting: Radiation Oncology

## 2017-04-27 DIAGNOSIS — Z510 Encounter for antineoplastic radiation therapy: Secondary | ICD-10-CM | POA: Diagnosis not present

## 2017-04-28 ENCOUNTER — Ambulatory Visit
Admission: RE | Admit: 2017-04-28 | Discharge: 2017-04-28 | Disposition: A | Discharge status: Home / Self Care | Payer: Medicare Other | Source: Ambulatory Visit | Attending: Radiation Oncology | Admitting: Radiation Oncology

## 2017-04-28 DIAGNOSIS — Z510 Encounter for antineoplastic radiation therapy: Secondary | ICD-10-CM | POA: Diagnosis not present

## 2017-05-02 ENCOUNTER — Encounter: Payer: Self-pay | Admitting: Internal Medicine

## 2017-05-02 ENCOUNTER — Ambulatory Visit (INDEPENDENT_AMBULATORY_CARE_PROVIDER_SITE_OTHER): Payer: Medicare Other | Admitting: Internal Medicine

## 2017-05-02 ENCOUNTER — Ambulatory Visit
Admission: RE | Admit: 2017-05-02 | Discharge: 2017-05-02 | Disposition: A | Discharge status: Home / Self Care | Payer: Medicare Other | Source: Ambulatory Visit | Attending: Radiation Oncology | Admitting: Radiation Oncology

## 2017-05-02 VITALS — BP 130/80 | HR 68 | Temp 98.0°F | Ht 69.0 in | Wt 172.2 lb

## 2017-05-02 DIAGNOSIS — C799 Secondary malignant neoplasm of unspecified site: Secondary | ICD-10-CM | POA: Diagnosis not present

## 2017-05-02 DIAGNOSIS — Z79899 Other long term (current) drug therapy: Secondary | ICD-10-CM | POA: Diagnosis not present

## 2017-05-02 DIAGNOSIS — Z510 Encounter for antineoplastic radiation therapy: Secondary | ICD-10-CM | POA: Diagnosis not present

## 2017-05-02 DIAGNOSIS — C801 Malignant (primary) neoplasm, unspecified: Secondary | ICD-10-CM

## 2017-05-02 DIAGNOSIS — Z79891 Long term (current) use of opiate analgesic: Secondary | ICD-10-CM

## 2017-05-02 DIAGNOSIS — IMO0002 Reserved for concepts with insufficient information to code with codable children: Secondary | ICD-10-CM

## 2017-05-02 DIAGNOSIS — R52 Pain, unspecified: Secondary | ICD-10-CM

## 2017-05-02 DIAGNOSIS — G8929 Other chronic pain: Secondary | ICD-10-CM

## 2017-05-02 DIAGNOSIS — M25569 Pain in unspecified knee: Secondary | ICD-10-CM | POA: Diagnosis not present

## 2017-05-02 MED ORDER — HYDROCODONE-ACETAMINOPHEN 10-325 MG PO TABS: ORAL_TABLET | ORAL | 0 refills | Status: DC

## 2017-05-02 NOTE — Progress Notes (Signed)
No chief complaint on file.   HPI: [name] 83 y.o. come in for Chronic disease management   Med check ;ast seen  Feb   Under rx for   nmet sicca left axilla   Here with daughter   Still taking pain pills  Mild dementia Oncology radiation last rx  This week  To get lesion removal  Right leg.   Hx of fracure  Unsteady gait r Hip in  Fall 17   orif  Now dpt done for hip but he is having right knee pain issues and stiffness  ? What to do  PT  No longer coming  .   Hx tkr ? [name] in past .   No falling  Wife under care mi doing cardiac rehab.   Using pain med   Bid not needs to sleep with pain  Memory about the same He uputs med in pill box ROS: See pertinent positives and negatives per HPI. No cp sob  Bleeding looking at friends home application He did have a se of pain meds in past     Past Medical History:  Diagnosis Date  . Adenomatous colon polyp   . Anemia   . Arthritis   . CAD (coronary artery disease)    s/p Taxus DES to prox and mid LAD 06/2003;  LHC  1/07: LM 205, LAD stents ok., oD2 70% jailed (tx conservatively), mid to dist RCA 40-50%, EF 40-45%;  echo 2/07: EF 55-65%  . COPD (chronic obstructive pulmonary disease) ([place])   . Dermatitis   . Diverticulosis of colon   . GERD (gastroesophageal reflux disease)   . Hemorrhoids   . Hiatal hernia   . History of prostate cancer   . History of renal calculi   . Hyperglycemia   . Hyperlipidemia   . Hypothyroidism   . Migraines   . Osteoarthritis   . Prostate cancer ([place])   . Transfusion history     Family History  Problem Relation Age of Onset  . Hypertension Other   . Arthritis Other   . Cancer Other   . Rheum arthritis Daughter     Social History   Social History  . Marital status: Married    Spouse name: N/A  . Number of children: N/A  . Years of education: N/A   Occupational History  . Retired Retired   Social History Main Topics  . Smoking status: Former Research scientist (life sciences)  . Smokeless tobacco:  Never Used     Comment: Stopped in 1986  . Alcohol use No  . Drug use: No  . Sexual activity: Not Asked   Other Topics Concern  . None   Social History Narrative   No regular exercise   HHof 2   Married wife no pets       Remote hx of tobacco stopped 1965   No etoh   REtired Investment banker, corporate level.    Outpatient Medications Prior to Visit  Medication Sig Dispense Refill  . calcium-vitamin D (OSCAL WITH D) 500-200 MG-UNIT tablet Take 1 tablet by mouth 2 (two) times daily.    . clobetasol (TEMOVATE) 0.05 % external solution APPLY TO AFFECTED AREA TWICE A DAY ON SCALP AS NEEDED (NOT TO FACE)  2  . diclofenac sodium (VOLTAREN) 1 % GEL Apply 1 application topically as needed.  0  . donepezil (ARICEPT) 10 MG tablet Take 1 tablet (10 mg total) by mouth at bedtime. 90 tablet 3  . emollient (BIAFINE)  cream Apply topically as needed.    . Multiple Vitamins-Minerals (OCUVITE PRESERVISION) TABS Take 1 tablet by mouth 2 (two) times daily.      Marland Kitchen HYDROcodone-acetaminophen (NORCO) 10-325 MG tablet Take 1 tablet as needed every 12 hours 24 tablet 0   No facility-administered medications prior to visit.      EXAM:  BP 130/80 (BP Location: Left Arm, Patient Position: Sitting, Cuff Size: Normal)   Pulse 68   Temp 98 F (36.7 C) (Oral)   Ht 5\' 9"  (1.753 m)   Wt 172 lb 3.2 oz (78.1 kg)   BMI 25.43 kg/m   Body mass index is 25.43 kg/m.  GENERAL: vitals reviewed and listed above, alert, oriented, appears well hydrated and in no acute distress using walker well but favoring   Here with daughter  HEENT: atraumatic, conjunctiva  clear, no obvious abnormalities on inspection of external nose and ears NECK: no obvious masses on inspection palpation  LUNGS: clear to auscultation bilaterally, no wheezes, rales or rhonchi,  CV: HRRR, no clubbing cyanosis or  peripheral edema nl cap refill  MS: moves all extremities without noticeable focal  Abnormality  Gait abnormality  Noted   Gets up from  chair pushing off  No acute redness knows  PSYCH: pleasant and cooperative, no obvious depression or anxiety Lab Results  Component Value Date   WBC 5.6 01/30/2017   HGB 13.7 01/30/2017   HCT 41.2 01/30/2017   PLT 223.0 01/30/2017   GLUCOSE 93 01/30/2017   CHOL 150 09/14/2016   TRIG 93.0 09/14/2016   HDL 47.50 09/14/2016   LDLCALC 84 09/14/2016   ALT 9 09/14/2016   AST 12 09/14/2016   NA 142 01/30/2017   K 4.3 01/30/2017   CL 103 01/30/2017   CREATININE 0.87 01/30/2017   BUN 15 01/30/2017   CO2 32 01/30/2017   TSH 1.14 09/14/2016   PSA 0.03 (L) 09/14/2016   INR 1.09 02/25/2012   BP Readings from Last 3 Encounters:  05/02/17 130/80  04/03/17 130/66  03/27/17 130/72   Wt Readings from Last 3 Encounters:  05/02/17 172 lb 3.2 oz (78.1 kg)  04/03/17 169 lb (76.7 kg)  03/27/17 169 lb 3.2 oz (76.7 kg)     ASSESSMENT AND PLAN:  Discussed the following assessment and plan:  Pain management  Medication management  Chronically on opiate therapy  Chronic knee pain, unspecified laterality  Metastatic squamous cell carcinoma (HCC) We discussed down shifting medication. To the equivalent of 5 mg twice a day occasionally 10. We reviewed pain med risk benefit and he reports that he has taken meds for 20 + years and has done well with this .  Disc pill boxes as needed mes and  Other double checking . With family member to avoid  Overuse and  onlyu do one week  Pills at  A time.  Had been given 24 in past  Week  Can rx 60 total to take max  Twice a day or 1/2 dose  Medication and as needed  Advise further val for knee pain  Since surgery and perhaps future PT  Would be helpful.  -Patient advised to return or notify health care team  if  new concerns arise. Total visit 30 mins > 50% spent counseling and coordinating care as indicated in above note and in instructions to patient .   Risk benefit of meds  Prescribing laws  Etc.    Patient Instructions  Pain medicatinos can  controbute to mental fogginess  and falls . Although can be helpful in functioning.   Advise continued cautious use  Of  Pain medication  at this time with  Oversight    As you are doing   Every 8- 12 hours if needed   Get the knee  Evaluated  ? If other Physical therapy or modalities could be helpful. For pain control .   Consider  To see if Dr Nelva Bush  .   Physical  Medicine  At the [facility]   could see you and help with the pain management . Also .  Glad you are doing better .  Plan rov in 3 months .   Newer  Guidelines require more frequent   Visit for people taking narcotic medications.         [name]. Panosh M.D.

## 2017-05-02 NOTE — Patient Instructions (Addendum)
Pain medicatinos can controbute to mental fogginess and falls . Although can be helpful in functioning.   Advise continued cautious use  Of  Pain medication  at this time with  Oversight    As you are doing   Every 8- 12 hours if needed   Get the knee  Evaluated  ? If other Physical therapy or modalities could be helpful. For pain control .   Consider  To see if Dr Nelva Bush  .   Physical  Medicine  At the [facility]   could see you and help with the pain management . Also .  Glad you are doing better .  Plan rov in 3 months .   Newer  Guidelines require more frequent   Visit for people taking narcotic medications.

## 2017-05-03 ENCOUNTER — Ambulatory Visit
Admission: RE | Admit: 2017-05-03 | Discharge: 2017-05-03 | Disposition: A | Discharge status: Home / Self Care | Payer: Medicare Other | Source: Ambulatory Visit | Attending: Radiation Oncology | Admitting: Radiation Oncology

## 2017-05-03 DIAGNOSIS — Z510 Encounter for antineoplastic radiation therapy: Secondary | ICD-10-CM | POA: Diagnosis not present

## 2017-05-04 ENCOUNTER — Ambulatory Visit
Admission: RE | Admit: 2017-05-04 | Discharge: 2017-05-04 | Disposition: A | Discharge status: Home / Self Care | Payer: Medicare Other | Source: Ambulatory Visit | Attending: Radiation Oncology | Admitting: Radiation Oncology

## 2017-05-04 DIAGNOSIS — Z510 Encounter for antineoplastic radiation therapy: Secondary | ICD-10-CM | POA: Diagnosis not present

## 2017-05-05 ENCOUNTER — Ambulatory Visit
Admission: RE | Admit: 2017-05-05 | Discharge: 2017-05-05 | Disposition: A | Discharge status: Home / Self Care | Payer: Medicare Other | Source: Ambulatory Visit | Attending: Radiation Oncology | Admitting: Radiation Oncology

## 2017-05-05 DIAGNOSIS — Z510 Encounter for antineoplastic radiation therapy: Secondary | ICD-10-CM | POA: Diagnosis not present

## 2017-05-05 DIAGNOSIS — C773 Secondary and unspecified malignant neoplasm of axilla and upper limb lymph nodes: Secondary | ICD-10-CM

## 2017-05-05 MED ORDER — BIAFINE EX EMUL
CUTANEOUS | Status: DC | PRN
Start: 2017-05-05 — End: 2017-05-05

## 2017-05-05 MED ORDER — BIAFINE EX EMUL
Freq: Two times a day (BID) | CUTANEOUS | Status: DC
  Administered 2017-05-05: 15:00:00 via TOPICAL

## 2017-05-05 MED ORDER — BIAFINE EX EMUL: CUTANEOUS | Status: DC | PRN

## 2017-05-05 MED ORDER — BIAFINE EX EMUL: Freq: Every day | CUTANEOUS | Status: DC

## 2017-05-08 ENCOUNTER — Ambulatory Visit
Admission: RE | Admit: 2017-05-08 | Discharge: 2017-05-08 | Disposition: A | Discharge status: Home / Self Care | Payer: Medicare Other | Source: Ambulatory Visit | Attending: Radiation Oncology | Admitting: Radiation Oncology

## 2017-05-08 ENCOUNTER — Encounter: Payer: Self-pay | Admitting: Radiation Oncology

## 2017-05-08 ENCOUNTER — Ambulatory Visit: Payer: Medicare Other

## 2017-05-08 DIAGNOSIS — Z510 Encounter for antineoplastic radiation therapy: Secondary | ICD-10-CM | POA: Diagnosis not present

## 2017-05-09 ENCOUNTER — Other Ambulatory Visit (HOSPITAL_COMMUNITY): Payer: Self-pay | Admitting: Orthopedic Surgery

## 2017-05-09 ENCOUNTER — Ambulatory Visit: Payer: Medicare Other

## 2017-05-09 ENCOUNTER — Other Ambulatory Visit: Payer: Self-pay | Admitting: Radiation Oncology

## 2017-05-09 DIAGNOSIS — Z96651 Presence of right artificial knee joint: Secondary | ICD-10-CM

## 2017-05-09 DIAGNOSIS — C773 Secondary and unspecified malignant neoplasm of axilla and upper limb lymph nodes: Secondary | ICD-10-CM

## 2017-05-09 NOTE — Progress Notes (Signed)
  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     Indication for treatment:  Curative  Radiation treatment dates:   03/27/17 - 05/08/17  Site/dose:  1) Left Chest: 50 Gy in 25 fractions 2) Left Chest Boost: 10 Gy in 5 fractions  Beams/energy:    Isodose Plan // 6X, 15X, Photon  Narrative: The patient tolerated radiation treatment relatively well. The patient had erythema of the left chest and upper back. He had itching in the treatment areas for which he used biafine and hydrocortisone. He also reported fatigue.  Plan: The patient has completed radiation treatment. The patient will return to radiation oncology clinic for routine followup in one month. I advised them to call or return sooner if they have any questions or concerns related to their recovery or treatment.  ------------------------------------------------  Jodelle Gross, MD, PhD  This document serves as a record of services personally performed by Kyung Rudd, MD. It was created on his behalf by Darcus Austin, a trained medical scribe. The creation of this record is based on the scribe's personal observations and the provider's statements to them. This document has been checked and approved by the attending provider.

## 2017-05-10 ENCOUNTER — Ambulatory Visit: Payer: Medicare Other

## 2017-05-11 ENCOUNTER — Ambulatory Visit: Payer: Medicare Other

## 2017-05-15 ENCOUNTER — Encounter (HOSPITAL_COMMUNITY)
Admission: RE | Admit: 2017-05-15 | Discharge: 2017-05-15 | Disposition: A | Discharge status: Home / Self Care | Payer: Medicare Other | Source: Ambulatory Visit | Attending: Orthopedic Surgery | Admitting: Orthopedic Surgery

## 2017-05-15 DIAGNOSIS — Z96651 Presence of right artificial knee joint: Secondary | ICD-10-CM | POA: Insufficient documentation

## 2017-05-15 MED ORDER — FLUDEOXYGLUCOSE F - 18 (FDG) INJECTION: 22.0000 | Freq: Once | INTRAVENOUS | Status: DC | PRN

## 2017-05-15 MED ORDER — TECHNETIUM TC 99M MEDRONATE IV KIT: 25.0000 | PACK | Freq: Once | INTRAVENOUS | Status: DC | PRN

## 2017-05-17 ENCOUNTER — Telehealth: Payer: Self-pay | Admitting: *Deleted

## 2017-05-17 NOTE — Telephone Encounter (Signed)
CALLED PATIENT TO INFORM OF ONE MONTH FU ON 07-19-17 @ 1:30 PM WITH ALISON PERKINS, LVM FOR A RETURN CALL

## 2017-06-01 ENCOUNTER — Ambulatory Visit (HOSPITAL_COMMUNITY)
Admission: RE | Admit: 2017-06-01 | Discharge: 2017-06-01 | Disposition: A | Discharge status: Home / Self Care | Payer: Medicare Other | Source: Ambulatory Visit | Attending: Radiation Oncology | Admitting: Radiation Oncology

## 2017-06-01 ENCOUNTER — Encounter (HOSPITAL_COMMUNITY): Payer: Self-pay

## 2017-06-01 ENCOUNTER — Other Ambulatory Visit: Payer: Self-pay | Admitting: Urology

## 2017-06-01 ENCOUNTER — Ambulatory Visit
Admission: RE | Admit: 2017-06-01 | Discharge: 2017-06-01 | Disposition: A | Discharge status: Home / Self Care | Payer: Medicare Other | Source: Ambulatory Visit | Attending: Radiation Oncology | Admitting: Radiation Oncology

## 2017-06-01 DIAGNOSIS — J439 Emphysema, unspecified: Secondary | ICD-10-CM | POA: Diagnosis not present

## 2017-06-01 DIAGNOSIS — C773 Secondary and unspecified malignant neoplasm of axilla and upper limb lymph nodes: Secondary | ICD-10-CM | POA: Insufficient documentation

## 2017-06-01 DIAGNOSIS — M1990 Unspecified osteoarthritis, unspecified site: Secondary | ICD-10-CM | POA: Insufficient documentation

## 2017-06-01 DIAGNOSIS — I700 Atherosclerosis of aorta: Secondary | ICD-10-CM | POA: Insufficient documentation

## 2017-06-01 DIAGNOSIS — Z8546 Personal history of malignant neoplasm of prostate: Secondary | ICD-10-CM | POA: Insufficient documentation

## 2017-06-01 DIAGNOSIS — Z87891 Personal history of nicotine dependence: Secondary | ICD-10-CM | POA: Insufficient documentation

## 2017-06-01 DIAGNOSIS — Z923 Personal history of irradiation: Secondary | ICD-10-CM | POA: Diagnosis not present

## 2017-06-01 DIAGNOSIS — Z79899 Other long term (current) drug therapy: Secondary | ICD-10-CM | POA: Insufficient documentation

## 2017-06-01 DIAGNOSIS — K219 Gastro-esophageal reflux disease without esophagitis: Secondary | ICD-10-CM | POA: Insufficient documentation

## 2017-06-01 DIAGNOSIS — K8020 Calculus of gallbladder without cholecystitis without obstruction: Secondary | ICD-10-CM | POA: Diagnosis not present

## 2017-06-01 DIAGNOSIS — Z510 Encounter for antineoplastic radiation therapy: Secondary | ICD-10-CM | POA: Insufficient documentation

## 2017-06-01 DIAGNOSIS — K5790 Diverticulosis of intestine, part unspecified, without perforation or abscess without bleeding: Secondary | ICD-10-CM | POA: Insufficient documentation

## 2017-06-01 DIAGNOSIS — R911 Solitary pulmonary nodule: Secondary | ICD-10-CM | POA: Insufficient documentation

## 2017-06-01 DIAGNOSIS — J449 Chronic obstructive pulmonary disease, unspecified: Secondary | ICD-10-CM | POA: Insufficient documentation

## 2017-06-01 DIAGNOSIS — I2510 Atherosclerotic heart disease of native coronary artery without angina pectoris: Secondary | ICD-10-CM | POA: Insufficient documentation

## 2017-06-01 DIAGNOSIS — E785 Hyperlipidemia, unspecified: Secondary | ICD-10-CM | POA: Insufficient documentation

## 2017-06-01 DIAGNOSIS — F0390 Unspecified dementia without behavioral disturbance: Secondary | ICD-10-CM | POA: Insufficient documentation

## 2017-06-01 HISTORY — DX: Squamous cell carcinoma of skin, unspecified: C44.92

## 2017-06-01 LAB — BUN AND CREATININE (CC13)
BUN: 18.4 mg/dL (ref 7.0–26.0)
Creatinine: 1 mg/dL (ref 0.7–1.3)
EGFR: 68 mL/min/{1.73_m2} — ABNORMAL LOW (ref >90)

## 2017-06-01 MED ORDER — IOPAMIDOL (ISOVUE-300) INJECTION 61%
INTRAVENOUS | Status: AC
  Filled 2017-06-01: qty 75

## 2017-06-01 MED ORDER — IOPAMIDOL (ISOVUE-300) INJECTION 61%
75.0000 mL | Freq: Once | INTRAVENOUS | Status: AC | PRN
Start: 2017-06-01 — End: 2017-06-01
  Administered 2017-06-01: 75 mL via INTRAVENOUS

## 2017-06-05 ENCOUNTER — Telehealth: Payer: Self-pay | Admitting: *Deleted

## 2017-06-05 NOTE — Telephone Encounter (Signed)
CALLED PATIENT TO ASK ABOUT COMING IN FOR RESULTS OF SCAN, PT. OPTED TO COME IN @ 9 AM

## 2017-06-06 ENCOUNTER — Ambulatory Visit
Admission: RE | Admit: 2017-06-06 | Discharge: 2017-06-06 | Disposition: A | Discharge status: Home / Self Care | Payer: Medicare Other | Source: Ambulatory Visit | Attending: Radiation Oncology | Admitting: Radiation Oncology

## 2017-06-06 ENCOUNTER — Encounter: Payer: Self-pay | Admitting: Radiation Oncology

## 2017-06-06 VITALS — BP 139/53 | HR 66 | Temp 97.8°F | Resp 18 | Ht 69.0 in | Wt 172.6 lb

## 2017-06-06 DIAGNOSIS — C801 Malignant (primary) neoplasm, unspecified: Secondary | ICD-10-CM | POA: Diagnosis not present

## 2017-06-06 DIAGNOSIS — C773 Secondary and unspecified malignant neoplasm of axilla and upper limb lymph nodes: Secondary | ICD-10-CM | POA: Diagnosis not present

## 2017-06-06 NOTE — Progress Notes (Signed)
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 The patient tolerated his radiotherapy well without incident. He underwent a repeat CT scan of the chest on 06/01/17 which unfortunately revealed persistent disease in the left axilla, with a new 7.2 mm node, and adenopathy in the mediastinum/carina measuring 24 mm, and 11 mm in the right hilum, and 13 mm also in the right hilum in addition to several pulmonary nodules.   On review of systems, the patient states overall he feels great and reports he thought that his axillary adenopathy was actually improving. He denies any edema, or limb pain. No new aches or pains are noted. No other complaints are verbalized.  ALLERGIES:  is allergic to aspirin; meperidine hcl; and pravastatin.  Meds: Current Outpatient Prescriptions  Medication Sig Dispense Refill  . calcium-vitamin D (OSCAL WITH D) 500-200 MG-UNIT tablet Take 1 tablet by mouth 2 (two) times daily.    . diclofenac sodium (VOLTAREN) 1 % GEL Apply 1 application topically as needed.  0  . donepezil (ARICEPT) 10 MG tablet Take 1 tablet (10 mg total) by mouth at bedtime. 90 tablet 3  . HYDROcodone-acetaminophen (NORCO) 10-325 MG tablet Take 1 tablet as needed every 12 hours 60 tablet 0  . Multiple Vitamins-Minerals (OCUVITE PRESERVISION) TABS Take 1 tablet by mouth 2 (two) times daily.      . clobetasol (TEMOVATE) 0.05 % external solution APPLY TO AFFECTED AREA TWICE A DAY ON SCALP AS NEEDED (NOT TO FACE)  2   No current facility-administered medications for this  encounter.     Physical Findings:  height is _0  (1.753 m) and weight is 172 lb 9.6 oz (78.3 kg). His oral temperature is 97.8 F (36.6 C). His blood pressure is 139/53 (abnormal) and his pulse is 66. His respiration is 18.  Pain Assessment Pain Score: 0-No pain/10 In general this is a well appearing caucasian male in no acute distress. He's alert and oriented x4 and appropriate throughout the examination. Cardiopulmonary assessment is negative for acute distress and he exhibits normal effort.   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 EXAM: CT CHEST WITH CONTRAST TECHNIQUE: Multidetector CT imaging of the chest was performed during intravenous contrast administration. CONTRAST:  80m ISOVUE-300 IOPAMIDOL (ISOVUE-300) INJECTION 61% COMPARISON:  PET-CT 02/27/2017.  Chest CT 10/06/2015. FINDINGS: Cardiovascular: The heart size is normal. No pericardial effusion. Coronary artery calcification is noted. Atherosclerotic calcification is noted in the wall of the thoracic aorta. Mediastinum/Nodes: 2.4 cm necrotic precarinal lymph node is new since 02/27/2017. Probable 11 mm crop necrotic right hilar lymph node. 13 mm short axis right hilar lymph node is identified on image 86 series 8.  The esophagus has normal imaging features. No right axillary lymphadenopathy. Soft tissue attenuation in the left axilla is stable, measuring 14 mm short axis today compared to 15 mm short axis on 02/27/2017. This is adjacent surgical clips and likely with represents the site of lymphadenectomy 7 mm inferior left axillary lymph node is new in the interval. Lungs/Pleura: Centrilobular and  paraseptal emphysema noted. 12 mm posterior right upper lobe pulmonary nodule (image 53 series 5) was 4 mm on the 02/27/2017 examined largely obscured by motion artifact. Posterior right upper lobe calcified granuloma again noted. 3 mm right middle lobe pulmonary nodule seen on image 96. 5 mm right lower lobe nodule seen image 79 is stable. 5 mm left lower lobe nodule seen on image 72 appears new. Interstitial and alveolar opacity in the lateral aspect of the left upper lobe is likely secondary to radiation. Upper Abdomen: Multiple large gallstones evident. Low-density thickening of the left adrenal gland likely related to adenomatous hyperplasia. Musculoskeletal: Superior endplate compression deformity is seen at T5 and T11. IMPRESSION: 1. Interval development of necrotic mediastinal lymphadenopathy with borderline right hilar lymphadenopathy evident on today's exam. Imaging features consistent with metastatic disease. 2. 12 mm posterior right upper lobe pulmonary nodule increased from 4 mm on the study 3 months ago. Imaging features are likely related to metastatic disease although primary bronchogenic neoplasm cannot be excluded by imaging. 3. New small inferior left axillary lymph node. Metastatic disease is a consideration and close attention on follow-up recommended. 4. New post radiation changes lateral left lung. 5. Cholelithiasis. Emphysema. ([id number]) Aortic Atherosclerois (ICD10-170.0) Electronically Signed   By: Misty Stanley M.D.   On: 06/01/2017 16:26   Nm Bone Scan 3 Phase  Result Date: 05/15/2017 CLINICAL DATA:  History of bilateral total knee arthroplasties several years ago. Right knee pain and limited range motion. EXAM: NUCLEAR MEDICINE 3-PHASE BONE SCAN TECHNIQUE: Radionuclide angiographic images, immediate static blood pool images, and 3-hour delayed static images were obtained of the bilateral lower extremities after intravenous injection of radiopharmaceutical. RADIOPHARMACEUTICALS:   22.0 MCi Tc-68mMDP COMPARISON:  None. FINDINGS: Vascular phase: Slight asymmetric flow to the right lower extremity. Blood pool phase: Asymmetric uptake around the right knee suspicious for synovitis. Delayed phase: Fairly symmetric and uniform uptake around both knee prostheses without findings suggest loosening or stress fracture. IMPRESSION: Asymmetric uptake around the right knee prosthesis on the blood pool images suggesting synovitis. No findings to suggest loosening, infection or stress fracture. Electronically Signed   By: PMarijo SanesM.D.   On: 05/15/2017 14:02    Impression/Plan: 1. Metastatic squamous cell carcinoma. I met with the patient and his family today and we reviewed the findings of his recent CT scan, and the concern for development of a new node in the left axilla since treatment, along with new mediastinal and carinal disease seen on his CT scan. We  Would recommend he be evaluated again by Dr. SAlen Blew After discussing the findings, Dr. SAlen Blewwould like to have a PET scan and meet back with him to discuss this. The patient is in agreement with this plan. We will see him back as needed but could consider further radiation of his carinal disease if he does not respond to systemic therapies.     ACarola Rhine PAC

## 2017-06-09 ENCOUNTER — Telehealth: Payer: Self-pay | Admitting: *Deleted

## 2017-06-09 NOTE — Telephone Encounter (Signed)
Called patient to inform of Pet Scan on 06-16-17 - arrival time - 8 am, pt. to  Be NPO- after midnight, test to be @ [facility],  pt to see Dr. Alen Blew on 06-22-17 per Shona Simpson request, lvm for a return call

## 2017-06-16 ENCOUNTER — Encounter (HOSPITAL_COMMUNITY)
Admission: RE | Admit: 2017-06-16 | Discharge: 2017-06-16 | Disposition: A | Discharge status: Home / Self Care | Payer: Medicare Other | Source: Ambulatory Visit | Attending: Radiation Oncology | Admitting: Radiation Oncology

## 2017-06-16 DIAGNOSIS — C773 Secondary and unspecified malignant neoplasm of axilla and upper limb lymph nodes: Secondary | ICD-10-CM | POA: Insufficient documentation

## 2017-06-16 LAB — GLUCOSE, CAPILLARY: Glucose-Capillary: 99 mg/dL (ref 65–99)

## 2017-06-16 MED ORDER — FLUDEOXYGLUCOSE F - 18 (FDG) INJECTION
8.5300 | Freq: Once | INTRAVENOUS | Status: AC | PRN
  Administered 2017-06-16: 8.53 via INTRAVENOUS

## 2017-06-19 ENCOUNTER — Telehealth: Payer: Self-pay | Admitting: Internal Medicine

## 2017-06-19 NOTE — Telephone Encounter (Signed)
° ° °  Pt request refill of the following: ° ° °HYDROcodone-acetaminophen (NORCO) 10-325 MG tablet ° ° °Phamacy: °

## 2017-06-19 NOTE — Telephone Encounter (Signed)
Please advise 

## 2017-06-20 MED ORDER — HYDROCODONE-ACETAMINOPHEN 10-325 MG PO TABS: 0.5000 | ORAL_TABLET | Freq: Two times a day (BID) | ORAL | 0 refills | Status: DC | PRN

## 2017-06-20 NOTE — Telephone Encounter (Signed)
Left a VM for patient to give the office a call back.  

## 2017-06-20 NOTE — Telephone Encounter (Signed)
tellpatient and family   Changing  The  instructions on  medication to half a pill twice a day if needed for pain dispense 30#   Like to have him  limit his use of medicine as possible to avoid falls and side effects.

## 2017-06-21 NOTE — Telephone Encounter (Signed)
Spoke to patient wife and explained the instruction and also that prescription is ready for pickup

## 2017-06-21 NOTE — Telephone Encounter (Signed)
Pt is returning [name] call °

## 2017-06-22 ENCOUNTER — Ambulatory Visit (HOSPITAL_BASED_OUTPATIENT_CLINIC_OR_DEPARTMENT_OTHER): Payer: Medicare Other | Admitting: Oncology

## 2017-06-22 ENCOUNTER — Telehealth: Payer: Self-pay | Admitting: *Deleted

## 2017-06-22 VITALS — BP 145/65 | HR 69 | Temp 98.0°F | Resp 18 | Ht 69.0 in | Wt 171.8 lb

## 2017-06-22 DIAGNOSIS — C4492 Squamous cell carcinoma of skin, unspecified: Secondary | ICD-10-CM | POA: Diagnosis not present

## 2017-06-22 DIAGNOSIS — Z8546 Personal history of malignant neoplasm of prostate: Secondary | ICD-10-CM | POA: Diagnosis not present

## 2017-06-22 DIAGNOSIS — C773 Secondary and unspecified malignant neoplasm of axilla and upper limb lymph nodes: Secondary | ICD-10-CM | POA: Diagnosis not present

## 2017-06-22 MED ORDER — DOXYCYCLINE HYCLATE 100 MG PO TBEC: 100.0000 mg | DELAYED_RELEASE_TABLET | Freq: Every day | ORAL | 3 refills | Status: DC

## 2017-06-22 MED ORDER — HYDROCORTISONE 1 % EX LOTN: 1.0000 "application " | TOPICAL_LOTION | Freq: Two times a day (BID) | CUTANEOUS | 0 refills | Status: AC

## 2017-06-22 NOTE — Progress Notes (Signed)
START OFF PATHWAY REGIMEN - [Other Dx]   [id number]:Panitumumab every 14 days:   A cycle is every 14 days:     Panitumumab   **Always confirm dose/schedule in your pharmacy ordering system**    Patient Characteristics: Intent of Therapy: Non-Curative / Palliative Intent, Discussed with Patient

## 2017-06-22 NOTE — Addendum Note (Signed)
Addended by: Wyatt Portela on: 06/22/2017 04:33 PM   Modules accepted: Orders

## 2017-06-22 NOTE — Telephone Encounter (Signed)
rec'd call from patient's wife jeanette, patient would like to start vectibix

## 2017-06-22 NOTE — Telephone Encounter (Signed)
Please let him know that he will be contacted with scheduling dates and times.  He will also be scheduled chemo class before he starts.  He is to pick up Doxycycline and hydrocortisone cream and start these when he starts Vectibix.

## 2017-06-22 NOTE — Patient Instructions (Signed)
Panitumumab Solution for Injection What is this medicine? PANITUMUMAB (pan i TOOM ue mab) is a monoclonal antibody. It is used to treat colorectal cancer. This medicine may be used for other purposes; ask your health care provider or pharmacist if you have questions. COMMON BRAND NAME(S): Vectibix What should I tell my health care provider before I take this medicine? They need to know if you have any of these conditions: -eye disease, vision problems -low levels of calcium, magnesium, or potassium in the blood -lung or breathing disease, like asthma -skin conditions or sensitivity -an unusual or allergic reaction to panitumumab, other medicines, foods, dyes, or preservatives -pregnant or trying to get pregnant -breast-feeding How should I use this medicine? This drug is given as an infusion into a vein. It is administered in a hospital or clinic by a specially trained health care professional. Talk to your pediatrician regarding the use of this medicine in children. Special care may be needed. Overdosage: If you think you have taken too much of this medicine contact a poison control center or emergency room at once. NOTE: This medicine is only for you. Do not share this medicine with others. What if I miss a dose? It is important not to miss your dose. Call your doctor or health care professional if you are unable to keep an appointment. What may interact with this medicine? Do not take this medicine with any of the following medications: -bevacizumab This list may not describe all possible interactions. Give your health care provider a list of all the medicines, herbs, non-prescription drugs, or dietary supplements you use. Also tell them if you smoke, drink alcohol, or use illegal drugs. Some items may interact with your medicine. What should I watch for while using this medicine? Visit your doctor for checks on your progress. This drug may make you feel generally unwell. This is not  uncommon, as chemotherapy can affect healthy cells as well as cancer cells. Report any side effects. Continue your course of treatment even though you feel ill unless your doctor tells you to stop. This medicine can make you more sensitive to the sun. Keep out of the sun while receiving this medicine and for 2 months after the last dose. If you cannot avoid being in the sun, wear protective clothing and use sunscreen. Do not use sun lamps or tanning beds/booths. In some cases, you may be given additional medicines to help with side effects. Follow all directions for their use. Call your doctor or health care professional for advice if you get a fever, chills or sore throat, or other symptoms of a cold or flu. Do not treat yourself. This drug decreases your body's ability to fight infections. Try to avoid being around people who are sick. Avoid taking products that contain aspirin, acetaminophen, ibuprofen, naproxen, or ketoprofen unless instructed by your doctor. These medicines may hide a fever. Do not become pregnant while taking this medicine and for 2 months after the last dose. Women should inform their doctor if they wish to become pregnant or think they might be pregnant. There is a potential for serious side effects to an unborn child. Talk to your health care professional or pharmacist for more information. Do not breast-feed an infant while taking this medicine or for 2 months after the last dose. What side effects may I notice from receiving this medicine? Side effects that you should report to your doctor or health care professional as soon as possible: -allergic reactions like skin rash, itching  or hives, swelling of the face, lips, or tongue -breathing problems -changes in vision -eye pain -fast, irregular heartbeat -fever, chills -mouth sores -red spots on the skin -redness, blistering, peeling or loosening of the skin, including inside the mouth -signs and symptoms of kidney injury  like trouble passing urine or change in the amount of urine -signs and symptoms of low blood pressure like dizziness; feeling faint or lightheaded, falls; unusually weak or tired -signs of low calcium like fast heartbeat, muscle cramps or muscle pain; pain, tingling, numbness in the hands or feet; seizures -signs and symptoms of low magnesium like muscle cramps, pain, or weakness; tremors; seizures; or fast, irregular heartbeat -signs and symptoms of low potassium like muscle cramps or muscle pain; chest pain; dizziness; feeling faint or lightheaded, falls; palpitations; breathing problems; or fast, irregular heartbeat -swelling of the ankles, feet, hands Side effects that usually do not require medical attention (report to your doctor or health care professional if they continue or are bothersome): -changes in skin like acne, cracks, skin dryness -diarrhea -eyelash growth -headache -mouth sores -nail changes -nausea, vomiting This list may not describe all possible side effects. Call your doctor for medical advice about side effects. You may report side effects to FDA at 1-800-FDA-1088. Where should I keep my medicine? This drug is given in a hospital or clinic and will not be stored at home. NOTE: This sheet is a summary. It may not cover all possible information. If you have questions about this medicine, talk to your doctor, pharmacist, or health care provider.  2018 Elsevier/Gold Standard (2016-06-10 16:45:04)

## 2017-06-22 NOTE — Progress Notes (Signed)
Hematology and Oncology Follow Up Visit  [name] [id number] 12-13-1929 83 y.o. 06/22/2017 1:36 PM Panosh, [name], MDPanosh, [name], MD   Principle Diagnosis: 83 year old gentleman with metastatic squamous cell carcinoma to the left axilla diagnosed in January 2018. He presented with a left axillary mass with biopsy proven to be squamous cell carcinoma likely metastatic from a scalp lesion. PET CT scan in March 2018 showed no distant metastasis. Repeat PET/CT scan in July 2018 showed pulmonary metastasis.   Prior Therapy:  He is status post excisional biopsy done on 12/08/2016 of his left axillary mass which showed squamous cell carcinoma. He is status post radiation therapy to the left chest and axilla completed in June 2018.  Current therapy: Under evaluation for possible systemic therapy.  Interim History: [name] presents today for a follow-up visit with his family. Since the last visit, he completed radiation therapy without any delayed complications. He continues to have issues with scalp pruritus and recurrent skin lesions most recently on his right shin. He is scheduled to have a repeat excision at that time. He still ambulates with the help of a walker without any falls or syncope. His performance status is limited but his appetite is reasonable. He has not reported any respiratory symptoms such as cough or dyspnea.  He does not report any headaches, blurry vision, syncope or seizures. He is not reporting any fevers, chills or sweats. He does not report any cough, wheezing or hemoptysis. He does not report any nausea, vomiting or abdominal pain. He is not reporting frequency urgency or hesitancy. He does not report any skeletal complaints of arthralgias or myalgias. Remaining review of systems unremarkable.   Medications: I have reviewed the patient's current medications.  Current Outpatient Prescriptions  Medication Sig Dispense Refill  . calcium-vitamin D (OSCAL WITH D)  500-200 MG-UNIT tablet Take 1 tablet by mouth 2 (two) times daily.    . clobetasol (TEMOVATE) 0.05 % external solution APPLY TO AFFECTED AREA TWICE A DAY ON SCALP AS NEEDED (NOT TO FACE)  2  . diclofenac sodium (VOLTAREN) 1 % GEL Apply 1 application topically as needed.  0  . donepezil (ARICEPT) 10 MG tablet Take 1 tablet (10 mg total) by mouth at bedtime. 90 tablet 3  . HYDROcodone-acetaminophen (NORCO) 10-325 MG tablet Take 0.5 tablets by mouth 2 (two) times daily as needed. For pain 30 tablet 0  . Multiple Vitamins-Minerals (OCUVITE PRESERVISION) TABS Take 1 tablet by mouth 2 (two) times daily.       No current facility-administered medications for this visit.      Allergies:  Allergies  Allergen Reactions  . Aspirin Other (See Comments)    Daily use causes bleeding in the bladder.  Can take occasionally  . Meperidine Hcl Other (See Comments)    REACTION: combative  . Pravastatin Other (See Comments)    ? Memory deficits improved when went off med    Past Medical History, Surgical history, Social history, and Family History were reviewed and updated.  Physical Exam: Blood pressure (!) 145/65, pulse 69, temperature 98 F (36.7 C), temperature source Oral, resp. rate 18, height _0  (1.753 m), weight 171 lb 12.8 oz (77.9 kg), SpO2 99 %. ECOG: 1 General appearance: alert and cooperative appeared without distress. Head: Normocephalic, without obvious abnormality Neck: no adenopathy Lymph nodes: Cervical, supraclavicular, and axillary nodes normal. No lymphadenopathy palpated. Heart:regular rate and rhythm, S1, S2 normal, no murmur, click, rub or gallop Lung:chest clear, no wheezing, rales, normal  symmetric air entry. Abdomin: soft, non-tender, without masses or organomegaly EXT:no erythema, induration. Lesion noted on his right shin appears flat and discolored.   Lab Results: Lab Results  Component Value Date   WBC 5.6 01/30/2017   HGB 13.7 01/30/2017   HCT 41.2 01/30/2017    MCV 88.4 01/30/2017   PLT 223.0 01/30/2017     Chemistry      Component Value Date/Time   NA 142 01/30/2017 1147   NA 145 12/20/2016   K 4.3 01/30/2017 1147   CL 103 01/30/2017 1147   CO2 32 01/30/2017 1147   BUN 18.4 06/01/2017 1053   CREATININE 1.0 06/01/2017 1053   GLU 105 12/20/2016      Component Value Date/Time   CALCIUM 9.4 01/30/2017 1147   ALKPHOS 94 09/14/2016 0837   AST 12 09/14/2016 0837   ALT 9 09/14/2016 0837   BILITOT 0.8 09/14/2016 0837      EXAM: NUCLEAR MEDICINE PET WHOLE BODY  TECHNIQUE: 8.53 mCi F-18 FDG was injected intravenously. Full-ring PET imaging was performed from the vertex to the feet after the radiotracer. CT data was obtained and used for attenuation correction and anatomic localization.  FASTING BLOOD GLUCOSE:  Value: 99 mg/dl  COMPARISON:  PET-CT 02/27/2017.  Chest CT 06/01/2017.  FINDINGS: HEAD/NECK  No hypermetabolic cervical lymph nodes are identified.There are no lesions of the pharyngeal mucosal space. No abnormal scalp or intracranial activity identified.  CHEST  The previously demonstrated hypermetabolic left axillary lymph node has been excised. There are new hypermetabolic mediastinal and right hilar lymph nodes. Precarinal node measures 3.0 x 2.6 cm on image 97 and has an SUV max of 20.0. There are 2 hypermetabolic right hilar nodes (SUV max of up to 15.1). There is new hypermetabolic right upper lobe nodule measuring 14 x 12 mm on image 30 (SUV max 10.2). There is also hypermetabolic activity peripherally at the left lung apex, corresponding with ill-defined parenchymal opacity, likely the sequela of radiation therapy. No other suspicious pulmonary nodules. There is a calcified right upper lobe granuloma. Aortic and coronary artery atherosclerosis are noted.  ABDOMEN/PELVIS  There is no hypermetabolic activity within the liver, adrenal glands, spleen or pancreas. There is no hypermetabolic  nodal activity. Cholelithiasis, aortoiliac atherosclerosis and postsurgical changes in the pelvis from prostatectomy and lymphadenectomy are noted.  SKELETON  There is no hypermetabolic activity to suggest osseous metastatic disease. There are posttraumatic deformities within the right hemipelvis and proximal right femur status post ORIF. Patient is status post bilateral total knee arthroplasty.  IMPRESSION:  1. New FDG avid mediastinal and right hilar nodal metastases. 2. New FDG avid right upper lobe pulmonary nodule, likely a metastasis. 3. Presumed radiation changes at the left lung apex. 4. No evidence of extra thoracic metastatic disease. 5. Cholelithiasis and atherosclerosis noted.     Impression and Plan:   83 year old gentleman with the following issues:  1. 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 He presented with a palpated left axillary mass that has been removed surgically on 12/08/2016. The pathology confirmed the presence of metastatic squamous cell carcinoma. 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  PET CT scan obtained on 02/27/2017 showed no evidence of widespread metastasis at that time.  He is status post radiation therapy to the left chest wall completed in June 2018.  PET CT scan obtained on 06/16/2017 was personally reviewed  and discussed with the patient and his family. He appears to have developed metastatic disease with lymphadenopathy and lung nodule.  The natural course of this disease was discussed today as well as treatment options. Any treatment moving forward at this time would be palliative and not curative. Different systemic therapy options were reviewed which include traditional systemic chemotherapy, monoclonal antibodies for EGFR and immunotherapy. Immunotherapy is not been approved for this indication and  remains experimental.  Complications associated with Vectibix were reviewed today with the patient and his family. These complications include nfusion-related problems, fatigue and dermatological toxicities. Acneiform rash is the biggest concern but will need to be treated with topical creams as well as antibiotics with doxycycline. The goal of therapy would be palliative at best.  Alternatively, a period of observation could also be used and repeat imaging studies in 3 months. If his disease remains stable or indolent, no immediate need for systemic therapy at this time. Written information was given to the patient and his family today and they will consider these options in the future.  Given his age and poor performance status it is reasonable to consider supportive care only as well.   2. Prostate cancer: He is status post a definitive radiation therapy without any evidence of recurrent disease. He follows with Dr. Jeffie Pollock in his most recent PSA have been in October 2017 nearly undetectable  3. Follow-up: Will be determined depending on his wishes. This will be an immediate future to start systemic therapy for a follow-up in 3 months after PET scan as elected to defer that therapy to a later date.    Zola Button, MD 7/19/20181:36 PM

## 2017-06-23 ENCOUNTER — Telehealth: Payer: Self-pay | Admitting: Oncology

## 2017-06-23 ENCOUNTER — Telehealth: Payer: Self-pay | Admitting: *Deleted

## 2017-06-23 NOTE — Telephone Encounter (Signed)
Scheduled appt per sch message 7/19 sch Dr. Alen Blew - left message and sent reminder letter in the mail.

## 2017-06-23 NOTE — Telephone Encounter (Signed)
Spoke with patient's wife, per dr Alen Blew he will have scripts for patient to p/u before starting vectibix. Schedulers will call with date and times.

## 2017-06-28 ENCOUNTER — Other Ambulatory Visit: Payer: Medicare Other

## 2017-06-28 ENCOUNTER — Encounter: Payer: Self-pay | Admitting: *Deleted

## 2017-07-04 ENCOUNTER — Telehealth: Payer: Self-pay | Admitting: *Deleted

## 2017-07-04 NOTE — Telephone Encounter (Signed)
Per Rob, in pharmacy, I informed wife and daughter that insurance would not cover chemotherapy. So, lab and chemo appointment cancelled. Per Dr. Alen Blew, [place] to start in two weeks. Rob will review paperwork for patient assistance with them tomorrow. Wife and daughter verbalized understanding.

## 2017-07-05 ENCOUNTER — Ambulatory Visit: Payer: Medicare Other

## 2017-07-05 ENCOUNTER — Other Ambulatory Visit: Payer: Medicare Other

## 2017-07-07 ENCOUNTER — Telehealth: Payer: Self-pay | Admitting: Emergency Medicine

## 2017-07-07 NOTE — Telephone Encounter (Addendum)
I would like him to see a pain  Physical Medicine  Physician  for pain  With help and medication  management .  If he agrees  Refer To dr Nelva Bush [facility])  As he is in the group that has seen him for his hip fracture and knees    .    But  can make appt to discuss this     But  Make sure either wife or  other family member come  To visit  To ensure  Coordinated communication.   Also please advise  Name of new  Oncologist  He will be seeing.  For our records  Thanks

## 2017-07-07 NOTE — Telephone Encounter (Signed)
Spoke with patient and his wife regarding pt wife needing to speak with Dr. Regis Bill personally. Patient wife states that she needing advice on switching cancer specialist but have taken care of the problem. Patient would like to switch back to his previous quantity for Hydrocodone. Explained to patient Dr. Regis Bill concerns with limit his use of medicine as possible to avoid falls and side effects.Please advise

## 2017-07-10 ENCOUNTER — Other Ambulatory Visit: Payer: Self-pay | Admitting: *Deleted

## 2017-07-10 MED ORDER — PROCHLORPERAZINE MALEATE 10 MG PO TABS: 10.0000 mg | ORAL_TABLET | Freq: Four times a day (QID) | ORAL | 0 refills | Status: AC | PRN

## 2017-07-10 NOTE — Telephone Encounter (Signed)
Left a VM for patient to give the office a call back.  

## 2017-07-11 ENCOUNTER — Ambulatory Visit: Payer: Medicare Other | Admitting: Oncology

## 2017-07-11 NOTE — Telephone Encounter (Signed)
Left a Vm for patient to give the office a call back.  

## 2017-07-12 ENCOUNTER — Encounter: Payer: Self-pay | Admitting: *Deleted

## 2017-07-12 ENCOUNTER — Other Ambulatory Visit: Payer: Self-pay | Admitting: Emergency Medicine

## 2017-07-12 DIAGNOSIS — Z79899 Other long term (current) drug therapy: Secondary | ICD-10-CM

## 2017-07-12 DIAGNOSIS — Z8781 Personal history of (healed) traumatic fracture: Secondary | ICD-10-CM

## 2017-07-12 DIAGNOSIS — Z8546 Personal history of malignant neoplasm of prostate: Secondary | ICD-10-CM

## 2017-07-12 DIAGNOSIS — Z9181 History of falling: Secondary | ICD-10-CM

## 2017-07-12 DIAGNOSIS — R52 Pain, unspecified: Secondary | ICD-10-CM

## 2017-07-12 NOTE — Telephone Encounter (Signed)
Spoke with patient and gave Dr. Regis Bill recommendations. Patient agreed to place referral for Dr. Nelva Bush

## 2017-07-12 NOTE — Progress Notes (Signed)
Rolla with Mr. And Mrs. Tavarious Freel to make sure that he understood he does not need to come in for a one month follow up appointment 07-19-17 since he had a follow up appointment 06-06-17 with Shona Simpson, P.A.  Told that he would be scheduled for his next appointment by our scheduling department.

## 2017-07-12 NOTE — Progress Notes (Addendum)
Concern about the risk of on going  narcotic because of the hx of falling causing fracture . In jan 2018   Dr [name] did his hip surgery. 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  Deb please make this information known  To Dr Nelva Bush office

## 2017-07-12 NOTE — Telephone Encounter (Signed)
Referral has been placed. 

## 2017-07-18 ENCOUNTER — Telehealth: Payer: Self-pay | Admitting: Internal Medicine

## 2017-07-18 MED ORDER — HYDROCODONE-ACETAMINOPHEN 10-325 MG PO TABS: 0.5000 | ORAL_TABLET | Freq: Two times a day (BID) | ORAL | 0 refills | Status: AC | PRN

## 2017-07-18 NOTE — Telephone Encounter (Signed)
done

## 2017-07-18 NOTE — Telephone Encounter (Signed)
Spoke with patient wife and regarding rx being ready for pick up.

## 2017-07-18 NOTE — Telephone Encounter (Signed)
Patient is requesting a refill. Last refill 06/20/2017 with 30 tablets dispensed. Last OV 05/02/2017. Please advise. Thank you

## 2017-07-18 NOTE — Telephone Encounter (Signed)
Pt request refill  HYDROcodone-acetaminophen (NORCO) 10-325 MG tablet     Pt wants you you to know he is starting treatment for his cancer and will not be able to start pain management.  Will get a better idea of what he can do after his treatment.   Until then needs med refill

## 2017-07-19 ENCOUNTER — Ambulatory Visit: Payer: Self-pay | Admitting: Radiation Oncology

## 2017-07-20 ENCOUNTER — Telehealth: Payer: Self-pay | Admitting: Oncology

## 2017-07-20 ENCOUNTER — Other Ambulatory Visit (HOSPITAL_BASED_OUTPATIENT_CLINIC_OR_DEPARTMENT_OTHER): Payer: Medicare Other

## 2017-07-20 ENCOUNTER — Ambulatory Visit (HOSPITAL_BASED_OUTPATIENT_CLINIC_OR_DEPARTMENT_OTHER): Payer: Medicare Other | Admitting: Oncology

## 2017-07-20 ENCOUNTER — Ambulatory Visit (HOSPITAL_BASED_OUTPATIENT_CLINIC_OR_DEPARTMENT_OTHER): Payer: Medicare Other

## 2017-07-20 VITALS — BP 136/53 | HR 68 | Temp 97.8°F | Resp 18 | Ht 69.0 in | Wt 168.8 lb

## 2017-07-20 DIAGNOSIS — C773 Secondary and unspecified malignant neoplasm of axilla and upper limb lymph nodes: Secondary | ICD-10-CM

## 2017-07-20 DIAGNOSIS — R918 Other nonspecific abnormal finding of lung field: Secondary | ICD-10-CM

## 2017-07-20 DIAGNOSIS — Z8546 Personal history of malignant neoplasm of prostate: Secondary | ICD-10-CM | POA: Diagnosis not present

## 2017-07-20 DIAGNOSIS — C4442 Squamous cell carcinoma of skin of scalp and neck: Secondary | ICD-10-CM

## 2017-07-20 DIAGNOSIS — C7800 Secondary malignant neoplasm of unspecified lung: Secondary | ICD-10-CM | POA: Diagnosis not present

## 2017-07-20 DIAGNOSIS — Z5112 Encounter for antineoplastic immunotherapy: Secondary | ICD-10-CM | POA: Diagnosis not present

## 2017-07-20 DIAGNOSIS — C4492 Squamous cell carcinoma of skin, unspecified: Secondary | ICD-10-CM

## 2017-07-20 DIAGNOSIS — R222 Localized swelling, mass and lump, trunk: Secondary | ICD-10-CM | POA: Diagnosis not present

## 2017-07-20 LAB — CBC WITH DIFFERENTIAL/PLATELET
BASO%: 0.5 % (ref 0.0–2.0)
Basophils Absolute: 0 10*3/uL (ref 0.0–0.1)
EOS ABS: 0.3 10*3/uL (ref 0.0–0.5)
EOS%: 4.2 % (ref 0.0–7.0)
HCT: 42.6 % (ref 38.4–49.9)
HEMOGLOBIN: 14 g/dL (ref 13.0–17.1)
LYMPH%: 20.6 % (ref 14.0–49.0)
MCH: 29.1 pg (ref 27.2–33.4)
MCHC: 32.8 g/dL (ref 32.0–36.0)
MCV: 88.8 fL (ref 79.3–98.0)
MONO#: 0.6 10*3/uL (ref 0.1–0.9)
MONO%: 9.4 % (ref 0.0–14.0)
NEUT%: 65.3 % (ref 39.0–75.0)
NEUTROS ABS: 4.3 10*3/uL (ref 1.5–6.5)
PLATELETS: 172 10*3/uL (ref 140–400)
RBC: 4.79 10*6/uL (ref 4.20–5.82)
RDW: 13.9 % (ref 11.0–14.6)
WBC: 6.6 10*3/uL (ref 4.0–10.3)
lymph#: 1.4 10*3/uL (ref 0.9–3.3)

## 2017-07-20 LAB — COMPREHENSIVE METABOLIC PANEL
ALBUMIN: 3.5 g/dL (ref 3.5–5.0)
ALT: 6 U/L (ref 0–55)
ANION GAP: 10 meq/L (ref 3–11)
AST: 12 U/L (ref 5–34)
Alkaline Phosphatase: 108 U/L (ref 40–150)
BILIRUBIN TOTAL: 0.56 mg/dL (ref 0.20–1.20)
BUN: 22 mg/dL (ref 7.0–26.0)
CO2: 26 mEq/L (ref 22–29)
Calcium: 9.6 mg/dL (ref 8.4–10.4)
Chloride: 107 mEq/L (ref 98–109)
Creatinine: 0.9 mg/dL (ref 0.7–1.3)
EGFR: 75 mL/min/{1.73_m2} — AB (ref >90)
GLUCOSE: 83 mg/dL (ref 70–140)
Potassium: 4.4 mEq/L (ref 3.5–5.1)
Sodium: 143 mEq/L (ref 136–145)
TOTAL PROTEIN: 6.6 g/dL (ref 6.4–8.3)

## 2017-07-20 LAB — MAGNESIUM: Magnesium: 2.1 mg/dl (ref 1.5–2.5)

## 2017-07-20 MED ORDER — SODIUM CHLORIDE 0.9 % IV SOLN
Freq: Once | INTRAVENOUS | Status: AC
  Administered 2017-07-20: 14:00:00 via INTRAVENOUS

## 2017-07-20 MED ORDER — SODIUM CHLORIDE 0.9 % IV SOLN
6.3000 mg/kg | Freq: Once | INTRAVENOUS | Status: AC
  Administered 2017-07-20: 500 mg via INTRAVENOUS
  Filled 2017-07-20: qty 20

## 2017-07-20 NOTE — Progress Notes (Signed)
Hematology and Oncology Follow Up Visit  [name] [id number] March 02, 1930 83 y.o. 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 He presented with a left axillary mass with biopsy proven to be squamous cell carcinoma likely metastatic from a scalp lesion. PET CT scan in March 2018 showed no distant metastasis. Repeat PET/CT scan in July 2018 showed pulmonary metastasis.   Prior Therapy:  He is status post excisional biopsy done on 12/08/2016 of his left axillary mass which showed squamous cell carcinoma. He is status post radiation therapy to the left chest and axilla completed in June 2018.  Current therapy: Vectibix 6 mg/kg cycle 1 to start on 07/20/2017.  Interim History: [name] presents today for a follow-up visit with his family. Since the last visit, he was diagnosed with basal cell carcinoma on his forehead and was removed by dermatology. He also have noted a small lump under his left breast. He does not report any other complaints at this time. He still ambulating with the help of walker without any falls or syncope. He did report some nausea associated with doxycycline which has resolved at this time. He is able to eat better although of lost 4 pounds.   He does not report any headaches, blurry vision, syncope or seizures. He is not reporting any fevers, chills or sweats. He does not report any cough, wheezing or hemoptysis. He does not report any nausea, vomiting or abdominal pain. He is not reporting frequency urgency or hesitancy. He does not report any skeletal complaints of arthralgias or myalgias. Remaining review of systems unremarkable.   Medications: I have reviewed the patient's current medications.  Current Outpatient Prescriptions  Medication Sig Dispense Refill  . calcium-vitamin D (OSCAL WITH D) 500-200 MG-UNIT tablet Take 1  tablet by mouth 2 (two) times daily.    . clobetasol (TEMOVATE) 0.05 % external solution APPLY TO AFFECTED AREA TWICE A DAY ON SCALP AS NEEDED (NOT TO FACE)  2  . diclofenac sodium (VOLTAREN) 1 % GEL Apply 1 application topically as needed.  0  . donepezil (ARICEPT) 10 MG tablet Take 1 tablet (10 mg total) by mouth at bedtime. 90 tablet 3  . doxycycline (DORYX) 100 MG EC tablet Take 1 tablet (100 mg total) by mouth daily. 60 tablet 3  . HYDROcodone-acetaminophen (NORCO) 10-325 MG tablet Take 0.5 tablets by mouth 2 (two) times daily as needed for moderate pain. For pain 30 tablet 0  . hydrocortisone 1 % lotion Apply 1 application topically 2 (two) times daily. Apply with the start of chemotherapy. 118 mL 0  . prochlorperazine (COMPAZINE) 10 MG tablet Take 1 tablet (10 mg total) by mouth every 6 (six) hours as needed for nausea or vomiting. 30 tablet 0  . Multiple Vitamins-Minerals (OCUVITE PRESERVISION) TABS Take 1 tablet by mouth 2 (two) times daily.       No current facility-administered medications for this visit.      Allergies:  Allergies  Allergen Reactions  . Aspirin Other (See Comments)    Daily use causes bleeding in the bladder.  Can take occasionally  . Meperidine Hcl Other (See Comments)    REACTION: combative  . Pravastatin Other (See Comments)    ? Memory deficits improved when went off med    Past Medical History, Surgical history, Social history, and Family History were reviewed and updated.  Physical Exam: Blood  pressure (!) 136/53, pulse 68, temperature 97.8 F (36.6 C), temperature source Oral, resp. rate 18, height 5' 9"  (1.753 m), weight 168 lb 12.8 oz (76.6 kg), SpO2 98 %. ECOG: 1 General appearance: Alert, awake gentleman without distress. Head: Normocephalic, without obvious abnormality Neck: no adenopathy Lymph nodes: Cervical, supraclavicular, and axillary nodes normal. No lymphadenopathy palpated. Heart:regular rate and rhythm, S1, S2 normal, no murmur,  click, rub or gallop Lung:chest clear, no wheezing, rales, normal symmetric air entry. Chest wall examination revealed a small protrusion around his left nipple. Abdomin: soft, non-tender, without masses or organomegaly EXT:no erythema, induration.    Lab Results: Lab Results  Component Value Date   WBC 6.6 07/20/2017   HGB 14.0 07/20/2017   HCT 42.6 07/20/2017   MCV 88.8 07/20/2017   PLT 172 07/20/2017     Chemistry      Component Value Date/Time   NA 143 07/20/2017 1236   K 4.4 07/20/2017 1236   CL 103 01/30/2017 1147   CO2 26 07/20/2017 1236   BUN 22.0 07/20/2017 1236   CREATININE 0.9 07/20/2017 1236   GLU 105 12/20/2016      Component Value Date/Time   CALCIUM 9.6 07/20/2017 1236   ALKPHOS 108 07/20/2017 1236   AST 12 07/20/2017 1236   ALT 6 07/20/2017 1236   BILITOT 0.56 07/20/2017 1236      Impression and Plan:   83 year old gentleman with the following issues:  1. 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 He presented with a palpated left axillary mass that has been removed surgically on 12/08/2016. The pathology confirmed the presence of metastatic squamous cell carcinoma. 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  PET CT scan obtained on 02/27/2017 showed no evidence of widespread metastasis at that time.  He is status post radiation therapy to the left chest wall completed in June 2018.  PET CT scan obtained on 06/16/2017 showed metastatic squamous cell carcinoma arising from the skin with lymphadenopathy and lung nodules.  The natural course of this disease as well as treatment options were reviewed again. He clearly requires systemic chemotherapy but he is not a candidate for cisplatin, 5-FU regimen given his age and comorbid conditions.  Alternative to systemic chemotherapy would be monoclonal antibody targeting EGFR. Cetuximab  and Vectibix has shown some activity in squamous cell carcinoma from the skin that has metastasized. Complications associated with this class of therapy was discussed today in detail. These complications would include infusion related complications and dermatological toxicities. The benefit would be potential disease control and possible improvement in overall survival and palliation of his disease.  After discussion today, he is agreeable to proceed with treatment. The plan is to proceed with total of 4 treatments of Vectibix every 2 weeks and repeat CT scan in 2 months.  2. Dermatological toxicities: This was discussed today in detail. Acneiform rash is known to be common with this medication. We have discussed strategies to combat this side effect. These would include using moisturizer cream, hydrocortisone cream and doxycycline oral therapy. Clindamycin cream can be used if these measures are not affective.  3. Prostate cancer: He is status post a definitive radiation therapy without any evidence of recurrent disease. He follows with Dr. Jeffie Pollock in his most recent PSA have been in October 2017 nearly undetectable  4. Follow-up: In 2 weeks for cycle 2 of therapy.  Zola Button, MD 8/16/20181:23 PM

## 2017-07-20 NOTE — Patient Instructions (Signed)
[name] for Patients Receiving Chemotherapy  Today you received the following chemotherapy agents Vectibix  To help prevent nausea and vomiting after your treatment, we encourage you to take your nausea medication as prescribed   If you develop nausea and vomiting that is not controlled by your nausea medication, call the clinic.   BELOW ARE SYMPTOMS THAT SHOULD BE REPORTED IMMEDIATELY:  *FEVER GREATER THAN 100.5 F  *CHILLS WITH OR WITHOUT FEVER  NAUSEA AND VOMITING THAT IS NOT CONTROLLED WITH YOUR NAUSEA MEDICATION  *UNUSUAL SHORTNESS OF BREATH  *UNUSUAL BRUISING OR BLEEDING  TENDERNESS IN MOUTH AND THROAT WITH OR WITHOUT PRESENCE OF ULCERS  *URINARY PROBLEMS  *BOWEL PROBLEMS  UNUSUAL RASH Items with * indicate a potential emergency and should be followed up as soon as possible.  Feel free to call the clinic you have any questions or concerns. The clinic phone number is [phone].  Please show the [place] at check-in to the Emergency Department and triage nurse.  Panitumumab Solution for Injection (Vectibix) What is this medicine? PANITUMUMAB (pan i TOOM ue mab) is a monoclonal antibody. It is used to treat colorectal cancer. This medicine may be used for other purposes; ask your health care provider or pharmacist if you have questions. COMMON BRAND NAME(S): Vectibix What should I tell my health care provider before I take this medicine? They need to know if you have any of these conditions: -eye disease, vision problems -low levels of calcium, magnesium, or potassium in the blood -lung or breathing disease, like asthma -skin conditions or sensitivity -an unusual or allergic reaction to panitumumab, other medicines, foods, dyes, or preservatives -pregnant or trying to get pregnant -breast-feeding How should I use this medicine? This drug is given as an infusion into a vein. It is administered in a hospital or  clinic by a specially trained health care professional. Talk to your pediatrician regarding the use of this medicine in children. Special care may be needed. Overdosage: If you think you have taken too much of this medicine contact a poison control center or emergency room at once. NOTE: This medicine is only for you. Do not share this medicine with others. What if I miss a dose? It is important not to miss your dose. Call your doctor or health care professional if you are unable to keep an appointment. What may interact with this medicine? Do not take this medicine with any of the following medications: -bevacizumab This list may not describe all possible interactions. Give your health care provider a list of all the medicines, herbs, non-prescription drugs, or dietary supplements you use. Also tell them if you smoke, drink alcohol, or use illegal drugs. Some items may interact with your medicine. What should I watch for while using this medicine? Visit your doctor for checks on your progress. This drug may make you feel generally unwell. This is not uncommon, as chemotherapy can affect healthy cells as well as cancer cells. Report any side effects. Continue your course of treatment even though you feel ill unless your doctor tells you to stop. This medicine can make you more sensitive to the sun. Keep out of the sun while receiving this medicine and for 2 months after the last dose. If you cannot avoid being in the sun, wear protective clothing and use sunscreen. Do not use sun lamps or tanning beds/booths. In some cases, you may be given additional medicines to help with side effects. Follow all directions for their  use. Call your doctor or health care professional for advice if you get a fever, chills or sore throat, or other symptoms of a cold or flu. Do not treat yourself. This drug decreases your body's ability to fight infections. Try to avoid being around people who are sick. Avoid taking  products that contain aspirin, acetaminophen, ibuprofen, naproxen, or ketoprofen unless instructed by your doctor. These medicines may hide a fever. Do not become pregnant while taking this medicine and for 2 months after the last dose. Women should inform their doctor if they wish to become pregnant or think they might be pregnant. There is a potential for serious side effects to an unborn child. Talk to your health care professional or pharmacist for more information. Do not breast-feed an infant while taking this medicine or for 2 months after the last dose. What side effects may I notice from receiving this medicine? Side effects that you should report to your doctor or health care professional as soon as possible: -allergic reactions like skin rash, itching or hives, swelling of the face, lips, or tongue -breathing problems -changes in vision -eye pain -fast, irregular heartbeat -fever, chills -mouth sores -red spots on the skin -redness, blistering, peeling or loosening of the skin, including inside the mouth -signs and symptoms of kidney injury like trouble passing urine or change in the amount of urine -signs and symptoms of low blood pressure like dizziness; feeling faint or lightheaded, falls; unusually weak or tired -signs of low calcium like fast heartbeat, muscle cramps or muscle pain; pain, tingling, numbness in the hands or feet; seizures -signs and symptoms of low magnesium like muscle cramps, pain, or weakness; tremors; seizures; or fast, irregular heartbeat -signs and symptoms of low potassium like muscle cramps or muscle pain; chest pain; dizziness; feeling faint or lightheaded, falls; palpitations; breathing problems; or fast, irregular heartbeat -swelling of the ankles, feet, hands Side effects that usually do not require medical attention (report to your doctor or health care professional if they continue or are bothersome): -changes in skin like acne, cracks, skin  dryness -diarrhea -eyelash growth -headache -mouth sores -nail changes -nausea, vomiting This list may not describe all possible side effects. Call your doctor for medical advice about side effects. You may report side effects to FDA at 1-800-FDA-1088. Where should I keep my medicine? This drug is given in a hospital or clinic and will not be stored at home. NOTE: This sheet is a summary. It may not cover all possible information. If you have questions about this medicine, talk to your doctor, pharmacist, or health care provider.  2018 Elsevier/Gold Standard (2016-06-10 16:45:04)

## 2017-07-20 NOTE — Telephone Encounter (Signed)
Scheduled appt per 8/16 los - Gave patient AVS and calender per los.  

## 2017-07-27 ENCOUNTER — Encounter (HOSPITAL_COMMUNITY): Payer: Self-pay | Admitting: Orthopedic Surgery

## 2017-07-27 NOTE — Addendum Note (Signed)
Addendum  created 07/27/17 0925 by Roberts Gaudy, MD   Sign clinical note

## 2017-07-28 ENCOUNTER — Telehealth: Payer: Self-pay | Admitting: Internal Medicine

## 2017-07-28 NOTE — Telephone Encounter (Signed)
Pt daughter Rodriquez Thorner [phone] came and dropped off [place] form for Dr Regis Bill to complete and fax to [phone] Attn: Leroy Sea. Please leave daughter a ph call msg to confirm its done and faxed. Placed forms in provider folder at front office area. cb

## 2017-08-01 ENCOUNTER — Telehealth: Payer: Self-pay | Admitting: *Deleted

## 2017-08-01 DIAGNOSIS — C4442 Squamous cell carcinoma of skin of scalp and neck: Secondary | ICD-10-CM

## 2017-08-01 NOTE — Telephone Encounter (Signed)
"  Keldric is nauseated for several days now.  Started nausea medicine yesterday evening and again at 8:30 this morning.  Medication helps some.  Helps keeping a little food in his stomach like dry toast, crackers.   He also has aches all over today.  Both knees up to hips.  Pain after surgery for shattered right femur has returned but now pain and aches are to both knees and hips.  Is it the cancer ir what?  He almost didn't make it to bathroom this morning, need advice what to do."  Denies emesis.  This nurse suggested taking nausea medicine every six hours with symptom/ feeling nausea.  If not nauseated do not use.  An alternative is thirty minutes before meals and at bedtime.  Chemotherapy can cause muscles aches and pains.  Apply Diclofenac gel to joints, try stretching before getting out of bed to lessen stiffness, get his bearings before getting up with walker.  If urinal in the home keep it at bedside.  Call back if no relief.

## 2017-08-02 ENCOUNTER — Telehealth: Payer: Self-pay | Admitting: Emergency Medicine

## 2017-08-02 NOTE — Telephone Encounter (Signed)
"  He took compazine before bedtime (10 - 11:00 pm).  Did not wake up nauseated this morning.  Still not eating right.  For breakfast, ate a small sausage egg burrito.  Last night ate chicken salad on toast.  Does not like and will not touch Ensure.  We need to talk with a dietician.  He took a nap, woke up at 1:00 pm with nausea.  Repeated compazine again at 2:30 pm.  It's hard to keep up with 30-minutes before meals so I'm going to try the every six hours schedule for the nausea medicine.   Knee, hip discomfort easing.  Saturday (07-29-2017) he went to [place] to see where our grandson will teach, rested Monday and Tuesday."  Glad he's feeing better today.  Please call as needed.  Sending Dietician request.  Scheduled tomorrow for lab at 1:30 followed by a 2.5 hrs chemotherapy treatment  beginning at 2:45 pm..

## 2017-08-02 NOTE — Telephone Encounter (Signed)
Message left requesting update in reference to nausea and body aches to bilateral knees radiating upward to hips reported yesterday.  Awaiting return call.

## 2017-08-02 NOTE — Telephone Encounter (Signed)
Left a VM for patient daughter to give the office call back regarding form .

## 2017-08-03 ENCOUNTER — Ambulatory Visit (HOSPITAL_BASED_OUTPATIENT_CLINIC_OR_DEPARTMENT_OTHER): Payer: Medicare Other

## 2017-08-03 ENCOUNTER — Other Ambulatory Visit (HOSPITAL_BASED_OUTPATIENT_CLINIC_OR_DEPARTMENT_OTHER): Payer: Medicare Other

## 2017-08-03 ENCOUNTER — Telehealth: Payer: Self-pay | Admitting: Internal Medicine

## 2017-08-03 VITALS — BP 122/49 | Temp 97.8°F

## 2017-08-03 DIAGNOSIS — Z5112 Encounter for antineoplastic immunotherapy: Secondary | ICD-10-CM

## 2017-08-03 DIAGNOSIS — C773 Secondary and unspecified malignant neoplasm of axilla and upper limb lymph nodes: Secondary | ICD-10-CM

## 2017-08-03 DIAGNOSIS — C4442 Squamous cell carcinoma of skin of scalp and neck: Secondary | ICD-10-CM

## 2017-08-03 DIAGNOSIS — C4492 Squamous cell carcinoma of skin, unspecified: Secondary | ICD-10-CM

## 2017-08-03 LAB — COMPREHENSIVE METABOLIC PANEL
ALT: 6 U/L (ref 0–55)
ANION GAP: 7 meq/L (ref 3–11)
AST: 11 U/L (ref 5–34)
Albumin: 3.3 g/dL — ABNORMAL LOW (ref 3.5–5.0)
Alkaline Phosphatase: 105 U/L (ref 40–150)
BILIRUBIN TOTAL: 0.5 mg/dL (ref 0.20–1.20)
BUN: 18.9 mg/dL (ref 7.0–26.0)
CO2: 28 meq/L (ref 22–29)
CREATININE: 0.9 mg/dL (ref 0.7–1.3)
Calcium: 9.6 mg/dL (ref 8.4–10.4)
Chloride: 106 mEq/L (ref 98–109)
EGFR: 75 mL/min/{1.73_m2} — ABNORMAL LOW (ref >90)
GLUCOSE: 182 mg/dL — AB (ref 70–140)
Potassium: 3.8 mEq/L (ref 3.5–5.1)
SODIUM: 141 meq/L (ref 136–145)
TOTAL PROTEIN: 6.7 g/dL (ref 6.4–8.3)

## 2017-08-03 LAB — CBC WITH DIFFERENTIAL/PLATELET
BASO%: 1.5 % (ref 0.0–2.0)
Basophils Absolute: 0.1 10*3/uL (ref 0.0–0.1)
EOS%: 2.7 % (ref 0.0–7.0)
Eosinophils Absolute: 0.2 10*3/uL (ref 0.0–0.5)
HCT: 42 % (ref 38.4–49.9)
HGB: 14 g/dL (ref 13.0–17.1)
LYMPH#: 0.6 10*3/uL — AB (ref 0.9–3.3)
LYMPH%: 10.4 % — AB (ref 14.0–49.0)
MCH: 29.3 pg (ref 27.2–33.4)
MCHC: 33.3 g/dL (ref 32.0–36.0)
MCV: 88 fL (ref 79.3–98.0)
MONO#: 0.5 10*3/uL (ref 0.1–0.9)
MONO%: 7.4 % (ref 0.0–14.0)
NEUT%: 78 % — ABNORMAL HIGH (ref 39.0–75.0)
NEUTROS ABS: 4.8 10*3/uL (ref 1.5–6.5)
PLATELETS: 184 10*3/uL (ref 140–400)
RBC: 4.78 10*6/uL (ref 4.20–5.82)
RDW: 13.3 % (ref 11.0–14.6)
WBC: 6.2 10*3/uL (ref 4.0–10.3)

## 2017-08-03 LAB — MAGNESIUM: Magnesium: 2 mg/dl (ref 1.5–2.5)

## 2017-08-03 MED ORDER — PANITUMUMAB CHEMO INJECTION 100 MG/5ML
6.4000 mg/kg | Freq: Once | INTRAVENOUS | Status: AC
  Administered 2017-08-03: 500 mg via INTRAVENOUS
  Filled 2017-08-03: qty 20

## 2017-08-03 MED ORDER — SODIUM CHLORIDE 0.9 % IV SOLN
Freq: Once | INTRAVENOUS | Status: AC
  Administered 2017-08-03: 15:00:00 via INTRAVENOUS

## 2017-08-03 NOTE — Progress Notes (Signed)
Per patient request, contacted Danton Sewer with Nutrition to schedule a time to meet with patient.

## 2017-08-03 NOTE — Telephone Encounter (Signed)
Pt returned your call and would like a call back .. °

## 2017-08-03 NOTE — Patient Instructions (Signed)
[facility] Discharge Instructions for Patients Receiving Chemotherapy  Today you received the following chemotherapy agents Vectibix  To help prevent nausea and vomiting after your treatment, we encourage you to take your nausea medication as prescribed   If you develop nausea and vomiting that is not controlled by your nausea medication, call the clinic.   BELOW ARE SYMPTOMS THAT SHOULD BE REPORTED IMMEDIATELY:  *FEVER GREATER THAN 100.5 F  *CHILLS WITH OR WITHOUT FEVER  NAUSEA AND VOMITING THAT IS NOT CONTROLLED WITH YOUR NAUSEA MEDICATION  *UNUSUAL SHORTNESS OF BREATH  *UNUSUAL BRUISING OR BLEEDING  TENDERNESS IN MOUTH AND THROAT WITH OR WITHOUT PRESENCE OF ULCERS  *URINARY PROBLEMS  *BOWEL PROBLEMS  UNUSUAL RASH Items with * indicate a potential emergency and should be followed up as soon as possible.  Feel free to call the clinic you have any questions or concerns. The clinic phone number is [phone].  Please show the [place] at check-in to the Emergency Department and triage nurse.

## 2017-08-04 ENCOUNTER — Telehealth: Payer: Self-pay | Admitting: Emergency Medicine

## 2017-08-04 NOTE — Telephone Encounter (Signed)
Left a message for [name] regarding patient paperwork. Patient is home with wife and needed to know if she still the paper work faxed over.

## 2017-08-04 NOTE — Telephone Encounter (Signed)
Spoke with patients wife regarding paper work. Will call friends home. Nothing further needed

## 2017-08-04 NOTE — Telephone Encounter (Signed)
Spoke with Leroy Sea regarding forms for patient. She received forms nothing further needed. Stanton Kidney is going to fax over form for patient wife to be filled out .

## 2017-08-09 ENCOUNTER — Telehealth: Payer: Self-pay

## 2017-08-09 NOTE — Telephone Encounter (Signed)
Wife called with multiple questions. Pt had 2nd vectibix on 8/30. Pt is cold a lot. Explained to keep temp in house up and keep plenty of clothes on, this is probably reaction to chemotherapy. He has frequent urination, no burning, no blood. He is trying to eat but eats little. He is drinking about 32 oz water per day, explained the goal is closer to 64 oz per day.  He is constantly nauseated and the compazine is not helping.  She has questions of what stage is the cancer, are the lung nodules significant, what is prognosis with and without treatment. Encouraged her to write these questions down and bring them to his next appt on 9/13.

## 2017-08-10 ENCOUNTER — Encounter: Payer: Self-pay | Admitting: *Deleted

## 2017-08-17 ENCOUNTER — Telehealth: Payer: Self-pay | Admitting: Oncology

## 2017-08-17 ENCOUNTER — Other Ambulatory Visit (HOSPITAL_BASED_OUTPATIENT_CLINIC_OR_DEPARTMENT_OTHER): Payer: Medicare Other

## 2017-08-17 ENCOUNTER — Ambulatory Visit (HOSPITAL_BASED_OUTPATIENT_CLINIC_OR_DEPARTMENT_OTHER): Payer: Medicare Other

## 2017-08-17 ENCOUNTER — Ambulatory Visit (HOSPITAL_BASED_OUTPATIENT_CLINIC_OR_DEPARTMENT_OTHER): Payer: Medicare Other | Admitting: Oncology

## 2017-08-17 ENCOUNTER — Ambulatory Visit: Payer: Medicare Other | Admitting: Nutrition

## 2017-08-17 DIAGNOSIS — C4442 Squamous cell carcinoma of skin of scalp and neck: Secondary | ICD-10-CM

## 2017-08-17 DIAGNOSIS — Z8546 Personal history of malignant neoplasm of prostate: Secondary | ICD-10-CM

## 2017-08-17 DIAGNOSIS — C773 Secondary and unspecified malignant neoplasm of axilla and upper limb lymph nodes: Secondary | ICD-10-CM

## 2017-08-17 DIAGNOSIS — Z5112 Encounter for antineoplastic immunotherapy: Secondary | ICD-10-CM | POA: Diagnosis not present

## 2017-08-17 DIAGNOSIS — C4492 Squamous cell carcinoma of skin, unspecified: Secondary | ICD-10-CM

## 2017-08-17 DIAGNOSIS — C7800 Secondary malignant neoplasm of unspecified lung: Secondary | ICD-10-CM | POA: Diagnosis not present

## 2017-08-17 DIAGNOSIS — C3490 Malignant neoplasm of unspecified part of unspecified bronchus or lung: Secondary | ICD-10-CM

## 2017-08-17 LAB — COMPREHENSIVE METABOLIC PANEL
ALT: <6 U/L (ref 0–55)
AST: 12 U/L (ref 5–34)
Albumin: 3.1 g/dL — ABNORMAL LOW (ref 3.5–5.0)
Alkaline Phosphatase: 103 U/L (ref 40–150)
Anion Gap: 8 mEq/L (ref 3–11)
BUN: 17.8 mg/dL (ref 7.0–26.0)
CALCIUM: 9.3 mg/dL (ref 8.4–10.4)
CHLORIDE: 106 meq/L (ref 98–109)
CO2: 27 mEq/L (ref 22–29)
CREATININE: 0.8 mg/dL (ref 0.7–1.3)
EGFR: 80 mL/min/{1.73_m2} — ABNORMAL LOW (ref >90)
Glucose: 115 mg/dl (ref 70–140)
Potassium: 3.9 mEq/L (ref 3.5–5.1)
Sodium: 142 mEq/L (ref 136–145)
Total Bilirubin: 0.64 mg/dL (ref 0.20–1.20)
Total Protein: 6.2 g/dL — ABNORMAL LOW (ref 6.4–8.3)

## 2017-08-17 LAB — CBC WITH DIFFERENTIAL/PLATELET
BASO%: 1.2 % (ref 0.0–2.0)
Basophils Absolute: 0.1 10*3/uL (ref 0.0–0.1)
EOS%: 3 % (ref 0.0–7.0)
Eosinophils Absolute: 0.2 10*3/uL (ref 0.0–0.5)
HEMATOCRIT: 40.6 % (ref 38.4–49.9)
HGB: 13.5 g/dL (ref 13.0–17.1)
LYMPH#: 0.7 10*3/uL — AB (ref 0.9–3.3)
LYMPH%: 12.2 % — ABNORMAL LOW (ref 14.0–49.0)
MCH: 29.2 pg (ref 27.2–33.4)
MCHC: 33.2 g/dL (ref 32.0–36.0)
MCV: 87.9 fL (ref 79.3–98.0)
MONO#: 0.5 10*3/uL (ref 0.1–0.9)
MONO%: 9.1 % (ref 0.0–14.0)
NEUT%: 74.5 % (ref 39.0–75.0)
NEUTROS ABS: 4.3 10*3/uL (ref 1.5–6.5)
Platelets: 188 10*3/uL (ref 140–400)
RBC: 4.63 10*6/uL (ref 4.20–5.82)
RDW: 13.2 % (ref 11.0–14.6)
WBC: 5.8 10*3/uL (ref 4.0–10.3)

## 2017-08-17 LAB — MAGNESIUM: Magnesium: 1.8 mg/dl (ref 1.5–2.5)

## 2017-08-17 MED ORDER — SODIUM CHLORIDE 0.9 % IV SOLN
6.4000 mg/kg | Freq: Once | INTRAVENOUS | Status: AC
  Administered 2017-08-17: 500 mg via INTRAVENOUS
  Filled 2017-08-17: qty 20

## 2017-08-17 NOTE — Progress Notes (Signed)
Hematology and Oncology Follow Up Visit  DONEVAN BILLER [id number] 30-Jun-1930 83 y.o. 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 He presented with a left axillary mass with biopsy proven to be squamous cell carcinoma likely metastatic from a scalp lesion. PET CT scan in March 2018 showed no distant metastasis. Repeat PET/CT scan in July 2018 showed pulmonary metastasis.   Prior Therapy:  He is status post excisional biopsy done on 12/08/2016 of his left axillary mass which showed squamous cell carcinoma. He is status post radiation therapy to the left chest and axilla completed in June 2018.  Current therapy: Vectibix 6 mg/kg cycle 1 given on 07/20/2017. He is here for cycle 3.  Interim History: Mr. Westfall presents today for a follow-up visit with his family. Since the last visit, he was treated with Vectibix. He received 2 treatments with few complications. He reported some mild nausea but no vomiting. He also developed mild erythematous rash which has not progressed dramatically. Its predominantly affecting his scalp. He does not report any other complaints at this time. He still ambulating with the help of walker without any falls or syncope. He did report some nausea associated with doxycycline which has resolved at this time. He is able to eat better although he lost a few pounds.   He does not report any headaches, blurry vision, syncope or seizures. He is not reporting any fevers, chills or sweats. He does not report any cough, wheezing or hemoptysis. He does not report any nausea, vomiting or abdominal pain. He is not reporting frequency urgency or hesitancy. He does not report any skeletal complaints of arthralgias or myalgias. Remaining review of systems unremarkable.   Medications: I have reviewed the patient's current medications.  Current  Outpatient Prescriptions  Medication Sig Dispense Refill  . calcium-vitamin D (OSCAL WITH D) 500-200 MG-UNIT tablet Take 1 tablet by mouth 2 (two) times daily.    . clobetasol (TEMOVATE) 0.05 % external solution APPLY TO AFFECTED AREA TWICE A DAY ON SCALP AS NEEDED (NOT TO FACE)  2  . diclofenac sodium (VOLTAREN) 1 % GEL Apply 1 application topically as needed.  0  . donepezil (ARICEPT) 10 MG tablet Take 1 tablet (10 mg total) by mouth at bedtime. 90 tablet 3  . doxycycline (DORYX) 100 MG EC tablet Take 1 tablet (100 mg total) by mouth daily. 60 tablet 3  . HYDROcodone-acetaminophen (NORCO) 10-325 MG tablet Take 0.5 tablets by mouth 2 (two) times daily as needed for moderate pain. For pain 30 tablet 0  . hydrocortisone 1 % lotion Apply 1 application topically 2 (two) times daily. Apply with the start of chemotherapy. 118 mL 0  . Multiple Vitamins-Minerals (OCUVITE PRESERVISION) TABS Take 1 tablet by mouth 2 (two) times daily.      . prochlorperazine (COMPAZINE) 10 MG tablet Take 1 tablet (10 mg total) by mouth every 6 (six) hours as needed for nausea or vomiting. 30 tablet 0   No current facility-administered medications for this visit.      Allergies:  Allergies  Allergen Reactions  . Aspirin Other (See Comments)    Daily use causes bleeding in the bladder.  Can take occasionally  . Meperidine Hcl Other (See Comments)    REACTION: combative  . Pravastatin Other (See Comments)    ? Memory deficits improved when went off med    Past Medical  History, Surgical history, Social history, and Family History were reviewed and updated.  Physical Exam: Blood pressure (!) 137/53, pulse 72, temperature 98.2 F (36.8 C), temperature source Oral, resp. rate 20, height 5\' 9"  (1.753 m), weight 164 lb 6.4 oz (74.6 kg), SpO2 99 %. ECOG: 1 General appearance: Elderly gentleman without distress. Head: Normocephalic, without obvious abnormality no oral thrush or ulcers. Neck: no adenopathy Lymph  nodes: Cervical, supraclavicular, and axillary nodes normal.  Heart:regular rate and rhythm, S1, S2 normal, no murmur, click, rub or gallop Lung:chest clear, no wheezing, rales, normal symmetric air entry. Abdomin: soft, non-tender, without masses or organomegaly EXT:no erythema, induration.  Skin: Few areas of erythematous papules around his nose and eye. Few on his scalp as well.  Lab Results: Lab Results  Component Value Date   WBC 5.8 08/17/2017   HGB 13.5 08/17/2017   HCT 40.6 08/17/2017   MCV 87.9 08/17/2017   PLT 188 08/17/2017     Chemistry      Component Value Date/Time   NA 141 08/03/2017 1307   K 3.8 08/03/2017 1307   CL 103 01/30/2017 1147   CO2 28 08/03/2017 1307   BUN 18.9 08/03/2017 1307   CREATININE 0.9 08/03/2017 1307   GLU 105 12/20/2016      Component Value Date/Time   CALCIUM 9.6 08/03/2017 1307   ALKPHOS 105 08/03/2017 1307   AST 11 08/03/2017 1307   ALT 6 08/03/2017 1307   BILITOT 0.50 08/03/2017 1307      Impression and Plan:   83 year old gentleman with the following issues:  1. 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 He presented with a palpated left axillary mass that has been removed surgically on 12/08/2016. The pathology confirmed the presence of metastatic squamous cell carcinoma. 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  PET CT scan obtained on 02/27/2017 showed no evidence of widespread metastasis at that time.  He is status post radiation therapy to the left chest wall completed in June 2018.  PET CT scan obtained on 06/16/2017 showed metastatic squamous cell carcinoma arising from the skin with lymphadenopathy and lung nodules.  He is currently on Vectibix and tolerated reasonably fair. The plan is to proceed with cycle 3 of treatment today and cycle 4 in 2 weeks. He will have a CT scan of the chest for  staging purposes after that.  2. Dermatological toxicities: He developed grade 1 Acneiform rash which is common with this medication. We have discussed strategies to combat this side effect. These would include using moisturizer cream, hydrocortisone cream and doxycycline oral therapy. Clindamycin cream can be used if these measures are not affective.  3. Prostate cancer: He is status post a definitive radiation therapy without any evidence of recurrent disease. He follows with Dr. Jeffie Pollock in his most recent PSA have been in October 2017 nearly undetectable  4. Follow-up: In 2 weeks for cycle 3 of therapy.    Zola Button, MD 9/13/201811:51 AM

## 2017-08-17 NOTE — Patient Instructions (Signed)
[facility] Discharge Instructions for Patients Receiving Chemotherapy  Today you received the following chemotherapy agents Vectibix  To help prevent nausea and vomiting after your treatment, we encourage you to take your nausea medication as directed.   If you develop nausea and vomiting that is not controlled by your nausea medication, call the clinic.   BELOW ARE SYMPTOMS THAT SHOULD BE REPORTED IMMEDIATELY:  *FEVER GREATER THAN 100.5 F  *CHILLS WITH OR WITHOUT FEVER  NAUSEA AND VOMITING THAT IS NOT CONTROLLED WITH YOUR NAUSEA MEDICATION  *UNUSUAL SHORTNESS OF BREATH  *UNUSUAL BRUISING OR BLEEDING  TENDERNESS IN MOUTH AND THROAT WITH OR WITHOUT PRESENCE OF ULCERS  *URINARY PROBLEMS  *BOWEL PROBLEMS  UNUSUAL RASH Items with * indicate a potential emergency and should be followed up as soon as possible.  Feel free to call the clinic you have any questions or concerns. The clinic phone number is [phone].  Please show the [facility] [facility] at check-in to the Emergency Department and triage nurse.

## 2017-08-17 NOTE — Progress Notes (Signed)
83 year old male diagnosed with scalp, skin of neck cancer with metastases to lymph nodes.  He is a patient of Dr.Shadad.  Past medical history includes prostate cancer, hypothyroidism, hyperlipidemia, hiatal hernia, GERD, COPD, CAD, and anemia.  Medications include calcium, vitamin D, Compazine, multivitamin.  Labs include glucose 115, albumin 3.1 on September 13.  Height: 69 inches. Weight: 164.4 pounds. Usual body weight: 179 pounds January 2018 BMI: 24.28.  Patient reports he has early morning nausea.  His prescription nausea medication was not effective. His daughter bought him a Seaband which seems to be working. Patient used to like sweets but now dislikes them. He is eating very small amounts and endorses 15 pound weight loss. Patient not eager to try foods that he doesn't like. He refuses oral nutrition supplements.  Nutrition diagnosis:  Food and nutrition related knowledge deficit related to metastatic cancer as evidenced by no prior need for nutrition related information.  Intervention: Educated patient on strategies to improve nausea. Suggested patient try ginger to relieve nausea. Review foods patient may enjoy and made suggestions for high calorie foods. Provided fact sheets. Questions were answered.  Teach back method used.  Contact information given.  Monitoring, evaluation, goals: Patient will tolerate increased calories and protein to minimize weight loss.  Next visit: To be scheduled as needed.  **Disclaimer: This note was dictated with voice recognition software. Similar sounding words can inadvertently be transcribed and this note may contain transcription errors which may not have been corrected upon publication of note.**

## 2017-08-17 NOTE — Telephone Encounter (Signed)
Gave avs and calendar for october °

## 2017-08-25 ENCOUNTER — Encounter: Payer: Self-pay | Admitting: Internal Medicine

## 2017-08-29 ENCOUNTER — Telehealth: Payer: Self-pay | Admitting: *Deleted

## 2017-08-29 NOTE — Telephone Encounter (Signed)
Voicemail from wife reporting "[name] is scheduled for treatment Thursday.  It does not feel like he received a break this time.  He's very sick.  He's been extra sick this time.  Call back [phone]."  "No one has called yet.  He is not eating, feels sick., tired, doesn't feel like he wants to live, he sees no point in being."  Spoke with patient who denies wanting to do harm to himself.  "I force myself to eat.  Just feel bad.  Have not thrown up but feel like I could at any time.  I stay nauseated all the time.  I think I have one more treatment left.  Only sleep three to four hours each night.  Cannot fall asleep.  No pain.  Last BM was two hours ago.  I drink lots of water."    Asked if he could talk with provider on Thursday, the 32 th about these concerns.Marland Kitchen  "Yes I can."   Next lab, MD F/U, chemotherapy scheduled 08-31-2017.  Routing call information to collaborative nurse and provider for review.  Further patient communication through collaborative nurse.

## 2017-08-29 NOTE — Telephone Encounter (Signed)
Had in depth conversation with patient's wife jeanette. States she feels like the last 2 treatments that patient has had have been "hell" on him. They are thinking of stopping this treatment d/t his quality of life. Wants to know why scan is so far in the future. Does he really need all of the treatments and what will happen if he stops the treatment? Is there a treatment available that is not so strong? Patient has an appt here with dr Alen Blew 08/31/17. Instructed her to address all of these questions with dr Alen Blew, during the visit.

## 2017-08-31 ENCOUNTER — Other Ambulatory Visit (HOSPITAL_BASED_OUTPATIENT_CLINIC_OR_DEPARTMENT_OTHER): Payer: Medicare Other

## 2017-08-31 ENCOUNTER — Ambulatory Visit (HOSPITAL_BASED_OUTPATIENT_CLINIC_OR_DEPARTMENT_OTHER): Payer: Medicare Other | Admitting: Oncology

## 2017-08-31 ENCOUNTER — Ambulatory Visit: Payer: Medicare Other

## 2017-08-31 VITALS — BP 127/51 | HR 79 | Temp 98.4°F | Resp 19 | Ht 69.0 in | Wt 163.6 lb

## 2017-08-31 DIAGNOSIS — R21 Rash and other nonspecific skin eruption: Secondary | ICD-10-CM

## 2017-08-31 DIAGNOSIS — R630 Anorexia: Secondary | ICD-10-CM | POA: Diagnosis not present

## 2017-08-31 DIAGNOSIS — C773 Secondary and unspecified malignant neoplasm of axilla and upper limb lymph nodes: Secondary | ICD-10-CM

## 2017-08-31 DIAGNOSIS — C4442 Squamous cell carcinoma of skin of scalp and neck: Secondary | ICD-10-CM

## 2017-08-31 DIAGNOSIS — C7800 Secondary malignant neoplasm of unspecified lung: Secondary | ICD-10-CM

## 2017-08-31 DIAGNOSIS — R531 Weakness: Secondary | ICD-10-CM

## 2017-08-31 DIAGNOSIS — C4492 Squamous cell carcinoma of skin, unspecified: Secondary | ICD-10-CM

## 2017-08-31 DIAGNOSIS — R627 Adult failure to thrive: Secondary | ICD-10-CM

## 2017-08-31 DIAGNOSIS — Z8546 Personal history of malignant neoplasm of prostate: Secondary | ICD-10-CM

## 2017-08-31 LAB — COMPREHENSIVE METABOLIC PANEL
ALT: <6 U/L (ref 0–55)
ANION GAP: 7 meq/L (ref 3–11)
AST: 9 U/L (ref 5–34)
Albumin: 3 g/dL — ABNORMAL LOW (ref 3.5–5.0)
Alkaline Phosphatase: 91 U/L (ref 40–150)
BUN: 17.4 mg/dL (ref 7.0–26.0)
CALCIUM: 9.1 mg/dL (ref 8.4–10.4)
CHLORIDE: 107 meq/L (ref 98–109)
CO2: 29 mEq/L (ref 22–29)
Creatinine: 0.8 mg/dL (ref 0.7–1.3)
EGFR: 80 mL/min/{1.73_m2} — AB (ref >90)
Glucose: 119 mg/dl (ref 70–140)
POTASSIUM: 3.8 meq/L (ref 3.5–5.1)
Sodium: 143 mEq/L (ref 136–145)
Total Bilirubin: 0.82 mg/dL (ref 0.20–1.20)
Total Protein: 6.1 g/dL — ABNORMAL LOW (ref 6.4–8.3)

## 2017-08-31 LAB — CBC WITH DIFFERENTIAL/PLATELET
BASO%: 0.1 % (ref 0.0–2.0)
BASOS ABS: 0 10*3/uL (ref 0.0–0.1)
EOS%: 2.9 % (ref 0.0–7.0)
Eosinophils Absolute: 0.2 10*3/uL (ref 0.0–0.5)
HEMATOCRIT: 38.1 % — AB (ref 38.4–49.9)
HGB: 12.4 g/dL — ABNORMAL LOW (ref 13.0–17.1)
LYMPH%: 11.2 % — AB (ref 14.0–49.0)
MCH: 29.3 pg (ref 27.2–33.4)
MCHC: 32.5 g/dL (ref 32.0–36.0)
MCV: 90.1 fL (ref 79.3–98.0)
MONO#: 1 10*3/uL — AB (ref 0.1–0.9)
MONO%: 12.6 % (ref 0.0–14.0)
NEUT#: 5.8 10*3/uL (ref 1.5–6.5)
NEUT%: 73.2 % (ref 39.0–75.0)
PLATELETS: 155 10*3/uL (ref 140–400)
RBC: 4.23 10*6/uL (ref 4.20–5.82)
RDW: 13.2 % (ref 11.0–14.6)
WBC: 7.9 10*3/uL (ref 4.0–10.3)
lymph#: 0.9 10*3/uL (ref 0.9–3.3)

## 2017-08-31 LAB — MAGNESIUM: Magnesium: 1.7 mg/dl (ref 1.5–2.5)

## 2017-08-31 MED ORDER — MEGESTROL ACETATE 400 MG/10ML PO SUSP: 400.0000 mg | Freq: Two times a day (BID) | ORAL | 0 refills | Status: DC

## 2017-08-31 NOTE — Progress Notes (Signed)
Hematology and Oncology Follow Up Visit  [name] [id number] 1930/03/01 83 y.o. 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 He presented with a left axillary mass with biopsy proven to be squamous cell carcinoma likely metastatic from a scalp lesion. PET CT scan in March 2018 showed no distant metastasis. Repeat PET/CT scan in July 2018 showed pulmonary metastasis.   Prior Therapy:  He is status post excisional biopsy done on 12/08/2016 of his left axillary mass which showed squamous cell carcinoma. He is status post radiation therapy to the left chest and axilla completed in June 2018.  Current therapy: Vectibix 6 mg/kg cycle 1 given on 07/20/2017. He is status post 3 cycles.  Interim History: [name] presents today for a follow-up visit with his family. Since the last visit, he reports overall decline in his health. He tolerated Vectibix poorly with complaints of fatigue, nausea and failure to thrive. His appetite has decreased although his weight remains stable. His overall energy level and performance status has declined. He also reported complications related to doxycycline which causing him GI upset. He denied any falls or syncope. He denied any breathing difficulties. He does report nonproductive cough.  He does not report any headaches, blurry vision, syncope or seizures. He is not reporting any fevers, chills or sweats. He does not report any cough, wheezing or hemoptysis. He does not report any nausea, vomiting or abdominal pain. He is not reporting frequency urgency or hesitancy. He does not report any skeletal complaints of arthralgias or myalgias. Remaining review of systems unremarkable.   Medications: I have reviewed the patient's current medications.  Current Outpatient Prescriptions  Medication Sig Dispense Refill  .  calcium-vitamin D (OSCAL WITH D) 500-200 MG-UNIT tablet Take 1 tablet by mouth 2 (two) times daily.    . clobetasol (TEMOVATE) 0.05 % external solution APPLY TO AFFECTED AREA TWICE A DAY ON SCALP AS NEEDED (NOT TO FACE)  2  . diclofenac sodium (VOLTAREN) 1 % GEL Apply 1 application topically as needed.  0  . donepezil (ARICEPT) 10 MG tablet Take 1 tablet (10 mg total) by mouth at bedtime. 90 tablet 3  . doxycycline (DORYX) 100 MG EC tablet Take 1 tablet (100 mg total) by mouth daily. 60 tablet 3  . HYDROcodone-acetaminophen (NORCO) 10-325 MG tablet Take 0.5 tablets by mouth 2 (two) times daily as needed for moderate pain. For pain 30 tablet 0  . hydrocortisone 1 % lotion Apply 1 application topically 2 (two) times daily. Apply with the start of chemotherapy. 118 mL 0  . Multiple Vitamins-Minerals (OCUVITE PRESERVISION) TABS Take 1 tablet by mouth 2 (two) times daily.      . prochlorperazine (COMPAZINE) 10 MG tablet Take 1 tablet (10 mg total) by mouth every 6 (six) hours as needed for nausea or vomiting. 30 tablet 0  . megestrol (MEGACE) 400 MG/10ML suspension Take 10 mLs (400 mg total) by mouth 2 (two) times daily. 240 mL 0   No current facility-administered medications for this visit.      Allergies:  Allergies  Allergen Reactions  . Aspirin Other (See Comments)    Daily use causes bleeding in the bladder.  Can take occasionally  . Meperidine Hcl Other (See Comments)    REACTION: combative  . Pravastatin Other (See Comments)    ? Memory deficits improved when went off med  Past Medical History, Surgical history, Social history, and Family History were reviewed and updated.  Physical Exam: Blood pressure (!) 127/51, pulse 79, temperature 98.4 F (36.9 C), temperature source Oral, resp. rate 19, height 5\' 9"  (1.753 m), weight 163 lb 9.6 oz (74.2 kg), SpO2 99 %. ECOG: 1 General appearance: Alert, awake gentleman without distress. Head: Normocephalic, without obvious abnormality no  oral ulcers or lesions. Neck: no adenopathy no masses or lesions. Lymph nodes: Cervical, supraclavicular, and axillary nodes normal.  Heart:regular rate and rhythm, S1, S2 normal, no murmur, click, rub or gallop Lung:chest clear, no wheezing, rales, normal symmetric air entry. Abdomin: soft, non-tender, without masses or organomegaly EXT:no erythema, induration.  Skin: Acneiform rash appears to be very limited.  Lab Results: Lab Results  Component Value Date   WBC 7.9 08/31/2017   HGB 12.4 (L) 08/31/2017   HCT 38.1 (L) 08/31/2017   MCV 90.1 08/31/2017   PLT 155 08/31/2017     Chemistry      Component Value Date/Time   NA 143 08/31/2017 0936   K 3.8 08/31/2017 0936   CL 103 01/30/2017 1147   CO2 29 08/31/2017 0936   BUN 17.4 08/31/2017 0936   CREATININE 0.8 08/31/2017 0936   GLU 105 12/20/2016      Component Value Date/Time   CALCIUM 9.1 08/31/2017 0936   ALKPHOS 91 08/31/2017 0936   AST 9 08/31/2017 0936   ALT <6 08/31/2017 0936   BILITOT 0.82 08/31/2017 0936      Impression and Plan:   83 year old gentleman with the following issues:  1. 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 He presented with a palpated left axillary mass that has been removed surgically on 12/08/2016. The pathology confirmed the presence of metastatic squamous cell carcinoma. 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  PET CT scan obtained on 02/27/2017 showed no evidence of widespread metastasis at that time.  He is status post radiation therapy to the left chest wall completed in June 2018.  PET CT scan obtained on 06/16/2017 showed metastatic squamous cell carcinoma arising from the skin with lymphadenopathy and lung nodules.  He is currently on Vectibix and tolerated Poorly. We have discussed treatment option at this time including discontinuation of treatment and  hospice consideration.  The plan is to restage him with a CT scan in the next 2 weeks and determine the next option. Depending on the results of the scan we can consider immunotherapy versus hospice enrollment.  2. Dermatological toxicities: He developed grade 1 Acneiform rash which is manageable at this time.  3. Prostate cancer: He is status post a definitive radiation therapy without any evidence of recurrent disease. He follows with Dr. Jeffie Pollock in his most recent PSA have been in October 2017 nearly undetectable  4. Anorexia, failure to thrive and weakness: Could be related to his treatment as well as doxycycline. I anticipate improvement of these complaints with discontinuation of therapy. I also gave him prescription for Megace to boost his appetite.  5. Follow-up: In 2 weeks to follow his progress.    Zola Button, MD 9/27/201810:19 AM

## 2017-09-01 ENCOUNTER — Encounter: Payer: Self-pay | Admitting: Oncology

## 2017-09-01 ENCOUNTER — Encounter: Payer: Self-pay | Admitting: *Deleted

## 2017-09-01 NOTE — Progress Notes (Signed)
Received denial from [place] [facility] for Megestrol Acetate.  Left at RN pod on desk.

## 2017-09-01 NOTE — Progress Notes (Signed)
Received PA request for Megestrol Acetate.  Submitted through Cover My Meds.  Charlies Silvers Key: [id number] - PA Case ID: [id number] - Rx #: [id number] Need help? Call us at [phone]  Status  Sent to [employer]  DrugMegestrol Acetate 40MG /ML OR SUSP  FormOptumRx Medicare Part D Electronic Prior Authorization Form  Original Claim [id number] Provide Exception Process Printed NoticePrescribers, contact PA at [phone], contact PA at [place]   Pending decision.

## 2017-09-04 ENCOUNTER — Telehealth: Payer: Self-pay | Admitting: Oncology

## 2017-09-04 ENCOUNTER — Other Ambulatory Visit: Payer: Self-pay | Admitting: *Deleted

## 2017-09-04 MED ORDER — MEGESTROL ACETATE 400 MG/10ML PO SUSP: 400.0000 mg | Freq: Two times a day (BID) | ORAL | 0 refills | Status: AC

## 2017-09-04 NOTE — Telephone Encounter (Signed)
09/01/17 prescription refill

## 2017-09-05 ENCOUNTER — Telehealth: Payer: Self-pay

## 2017-09-05 ENCOUNTER — Other Ambulatory Visit: Payer: Self-pay | Admitting: *Deleted

## 2017-09-05 ENCOUNTER — Other Ambulatory Visit: Payer: Self-pay | Admitting: Oncology

## 2017-09-05 DIAGNOSIS — C3490 Malignant neoplasm of unspecified part of unspecified bronchus or lung: Secondary | ICD-10-CM

## 2017-09-05 DIAGNOSIS — C773 Secondary and unspecified malignant neoplasm of axilla and upper limb lymph nodes: Secondary | ICD-10-CM

## 2017-09-05 MED ORDER — ONDANSETRON HCL 4 MG PO TABS: 4.0000 mg | ORAL_TABLET | Freq: Three times a day (TID) | ORAL | 0 refills | Status: DC | PRN

## 2017-09-05 NOTE — Telephone Encounter (Signed)
Last tx was 9/13 vectibix. Last Thursday 9/27 vectibix was cancelled.   Pt stays nauseous. Pt will feel good then nausea will hit him. He does not throw up. 10 mg compazine will make him sleep. They use 1/2 tablet, it helps some.  He is not eating well.  Does he need a different nausea medication? The megace was denied by insurance.   Yesterday he was saying his vision is foggy and it comes and goes. This is new. No eye pain.  He last saw his eye doctor a couple months ago. This fogginess is different from prior eye issues.  Daughter is asking if this is possible brain mets.   No schedule yet for a CT chest. PA request sent to [facility].

## 2017-09-05 NOTE — Telephone Encounter (Signed)
Per Dr. Alen Blew, I escribed Zofran to his pharmacy since, Compazine makes him too sleep. Also, it is unlikely he has mets but Dr. Alen Blew will do an MRI to be sure. Instructed patient to call [facility] if he had any questions or concerns.

## 2017-09-05 NOTE — Telephone Encounter (Signed)
CT PA received. CT scheduled for 10/9.

## 2017-09-07 ENCOUNTER — Encounter (HOSPITAL_COMMUNITY): Payer: Self-pay | Admitting: Family Medicine

## 2017-09-07 ENCOUNTER — Emergency Department (HOSPITAL_COMMUNITY)
Admission: EM | Admit: 2017-09-07 | Discharge: 2017-09-07 | Disposition: A | Discharge status: Home / Self Care | Payer: Medicare Other | Attending: Emergency Medicine | Admitting: Emergency Medicine

## 2017-09-07 ENCOUNTER — Emergency Department (HOSPITAL_COMMUNITY): Payer: Medicare Other

## 2017-09-07 DIAGNOSIS — Z955 Presence of coronary angioplasty implant and graft: Secondary | ICD-10-CM | POA: Insufficient documentation

## 2017-09-07 DIAGNOSIS — Z8546 Personal history of malignant neoplasm of prostate: Secondary | ICD-10-CM | POA: Diagnosis not present

## 2017-09-07 DIAGNOSIS — K529 Noninfective gastroenteritis and colitis, unspecified: Secondary | ICD-10-CM | POA: Diagnosis not present

## 2017-09-07 DIAGNOSIS — E039 Hypothyroidism, unspecified: Secondary | ICD-10-CM | POA: Diagnosis not present

## 2017-09-07 DIAGNOSIS — Z96651 Presence of right artificial knee joint: Secondary | ICD-10-CM | POA: Diagnosis not present

## 2017-09-07 DIAGNOSIS — Z85118 Personal history of other malignant neoplasm of bronchus and lung: Secondary | ICD-10-CM | POA: Insufficient documentation

## 2017-09-07 DIAGNOSIS — Z85828 Personal history of other malignant neoplasm of skin: Secondary | ICD-10-CM | POA: Insufficient documentation

## 2017-09-07 DIAGNOSIS — Z87891 Personal history of nicotine dependence: Secondary | ICD-10-CM | POA: Insufficient documentation

## 2017-09-07 DIAGNOSIS — C773 Secondary and unspecified malignant neoplasm of axilla and upper limb lymph nodes: Secondary | ICD-10-CM | POA: Insufficient documentation

## 2017-09-07 DIAGNOSIS — F0390 Unspecified dementia without behavioral disturbance: Secondary | ICD-10-CM | POA: Insufficient documentation

## 2017-09-07 DIAGNOSIS — J449 Chronic obstructive pulmonary disease, unspecified: Secondary | ICD-10-CM | POA: Insufficient documentation

## 2017-09-07 DIAGNOSIS — Z79899 Other long term (current) drug therapy: Secondary | ICD-10-CM | POA: Diagnosis not present

## 2017-09-07 DIAGNOSIS — I10 Essential (primary) hypertension: Secondary | ICD-10-CM | POA: Insufficient documentation

## 2017-09-07 DIAGNOSIS — K5900 Constipation, unspecified: Secondary | ICD-10-CM | POA: Diagnosis present

## 2017-09-07 DIAGNOSIS — I2510 Atherosclerotic heart disease of native coronary artery without angina pectoris: Secondary | ICD-10-CM | POA: Diagnosis not present

## 2017-09-07 HISTORY — DX: Malignant neoplasm of unspecified part of unspecified bronchus or lung: C34.90

## 2017-09-07 LAB — COMPREHENSIVE METABOLIC PANEL
ALK PHOS: 87 U/L (ref 38–126)
ALT: 17 U/L (ref 17–63)
ANION GAP: 12 (ref 5–15)
AST: 20 U/L (ref 15–41)
Albumin: 3.3 g/dL — ABNORMAL LOW (ref 3.5–5.0)
BUN: 21 mg/dL — ABNORMAL HIGH (ref 6–20)
CALCIUM: 9.2 mg/dL (ref 8.9–10.3)
CO2: 27 mmol/L (ref 22–32)
CREATININE: 1.07 mg/dL (ref 0.61–1.24)
Chloride: 103 mmol/L (ref 101–111)
Glucose, Bld: 224 mg/dL — ABNORMAL HIGH (ref 65–99)
Potassium: 3.6 mmol/L (ref 3.5–5.1)
SODIUM: 142 mmol/L (ref 135–145)
TOTAL PROTEIN: 6.4 g/dL — AB (ref 6.5–8.1)
Total Bilirubin: 0.3 mg/dL (ref 0.3–1.2)

## 2017-09-07 LAB — I-STAT CHEM 8, ED
BUN: 20 mg/dL (ref 6–20)
CHLORIDE: 101 mmol/L (ref 101–111)
Calcium, Ion: 1.11 mmol/L — ABNORMAL LOW (ref 1.15–1.40)
Creatinine, Ser: 1 mg/dL (ref 0.61–1.24)
GLUCOSE: 217 mg/dL — AB (ref 65–99)
HCT: 39 % (ref 39.0–52.0)
Hemoglobin: 13.3 g/dL (ref 13.0–17.0)
POTASSIUM: 3.6 mmol/L (ref 3.5–5.1)
Sodium: 141 mmol/L (ref 135–145)
TCO2: 28 mmol/L (ref 22–32)

## 2017-09-07 LAB — CBC WITH DIFFERENTIAL/PLATELET
Basophils Absolute: 0 10*3/uL (ref 0.0–0.1)
Basophils Relative: 0 %
EOS ABS: 0 10*3/uL (ref 0.0–0.7)
EOS PCT: 0 %
HCT: 40.7 % (ref 39.0–52.0)
HEMOGLOBIN: 13.3 g/dL (ref 13.0–17.0)
LYMPHS ABS: 0.9 10*3/uL (ref 0.7–4.0)
LYMPHS PCT: 7 %
MCH: 28.9 pg (ref 26.0–34.0)
MCHC: 32.7 g/dL (ref 30.0–36.0)
MCV: 88.5 fL (ref 78.0–100.0)
MONOS PCT: 1 %
Monocytes Absolute: 0.1 10*3/uL (ref 0.1–1.0)
NEUTROS PCT: 92 %
Neutro Abs: 11.4 10*3/uL — ABNORMAL HIGH (ref 1.7–7.7)
Platelets: 231 10*3/uL (ref 150–400)
RBC: 4.6 MIL/uL (ref 4.22–5.81)
RDW: 12.9 % (ref 11.5–15.5)
WBC: 12.5 10*3/uL — AB (ref 4.0–10.5)

## 2017-09-07 LAB — POC OCCULT BLOOD, ED: FECAL OCCULT BLD: POSITIVE — AB

## 2017-09-07 MED ORDER — IOPAMIDOL (ISOVUE-300) INJECTION 61%
INTRAVENOUS | Status: AC
  Administered 2017-09-07: 100 mL via INTRAVENOUS
  Filled 2017-09-07: qty 100

## 2017-09-07 MED ORDER — AMOXICILLIN-POT CLAVULANATE 875-125 MG PO TABS: 1.0000 | ORAL_TABLET | Freq: Two times a day (BID) | ORAL | 0 refills | Status: DC

## 2017-09-07 MED ORDER — SODIUM CHLORIDE 0.9 % IV BOLUS (SEPSIS)
1000.0000 mL | Freq: Once | INTRAVENOUS | Status: AC
  Administered 2017-09-07: 1000 mL via INTRAVENOUS

## 2017-09-07 NOTE — ED Provider Notes (Signed)
[facility] DEPT Provider Note   CSN: [id number] Arrival date & time: 09/07/17  1557     History   Chief Complaint Chief Complaint  Patient presents with  . Blood In Stools    HPI [name] is a 83 y.o. male.  83 yo M with a chief complaint of constipation. This been an ongoing issue for him. Today the patient tried to manually disimpact himself this was followed by a fleets enema. The patient had a large output of stool followed by some mucousy blood. He felt a little shaky afterwards as well. Family is concerned by how shaky was. He also continues to have output from his rectum.   The history is provided by the patient and the spouse.  Illness  This is a new problem. The current episode started 2 days ago. The problem occurs constantly. The problem has not changed since onset.Associated symptoms include abdominal pain. Pertinent negatives include no chest pain, no headaches and no shortness of breath. Nothing aggravates the symptoms. Nothing relieves the symptoms. He has tried nothing for the symptoms. The treatment provided no relief.    Past Medical History:  Diagnosis Date  . Adenomatous colon polyp   . Anemia   . Arthritis   . CAD (coronary artery disease)    s/p Taxus DES to prox and mid LAD 06/2003;  LHC  1/07: LM 205, LAD stents ok., oD2 70% jailed (tx conservatively), mid to dist RCA 40-50%, EF 40-45%;  echo 2/07: EF 55-65%  . COPD (chronic obstructive pulmonary disease) ([place])   . Dermatitis   . Diverticulosis of colon   . GERD (gastroesophageal reflux disease)   . Hemorrhoids   . Hiatal hernia   . History of prostate cancer   . History of renal calculi   . Hyperglycemia   . Hyperlipidemia   . Hypothyroidism   . Lung cancer ([place])   . Migraines   . Osteoarthritis   . Prostate cancer ([place]) dx'd 1992   xrt/ surg  . Squamous cell skin cancer dx'd 12/2016   left axilla  . Transfusion history     Patient Active Problem List   Diagnosis Date Noted  .  Squamous cell cancer of scalp and skin of neck 07/20/2017  . Hx of fall 07/12/2017  . Hx of fracture of hip 07/12/2017  . Mild dementia 04/11/2017  . 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 Closed right hip fracture ([place]) 12/12/2016  . Other fatigue 09/27/2016  . Influenza vaccination declined by patient 09/27/2016  . Anxiety state 10/10/2015  . Abnormal chest x-ray 10/02/2015  . 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 Memory loss 04/13/2015  . Chronically on opiate therapy 12/02/2014  . Goals of care, counseling/discussion 04/30/2014  . Pain management 04/30/2014  . Essential hypertension 09/19/2013  . Medication management 08/19/2012  . Wears hearing aid 02/19/2012  . Macular degeneration 02/19/2012  . Opiate use 02/19/2012  . LESION, FACE 03/17/2010  . SYNCOPE 03/17/2010  . HYPERKERATOSIS 10/06/2009  . OSTEOARTHRITIS 05/22/2009  . HYPOTHYROIDISM 07/02/2008  . ANEMIA 07/02/2008  . FATIGUE 07/02/2008  . TOTAL KNEE REPLACEMENT, RIGHT, HX OF 07/02/2008  . ADENOMATOUS COLONIC POLYP 01/31/2008  . HEMORRHOIDS 01/31/2008  . COPD 01/31/2008  . Diaphragmatic hernia 01/31/2008  . DIVERTICULOSIS OF COLON 01/31/2008  . RENAL CALCULUS, HX OF 01/31/2008  . DERMATITIS 11/06/2007  . ARTHRITIS 11/06/2007  . HYPERLIPIDEMIA 06/20/2007  . Coronary atherosclerosis 06/20/2007  . GERD 06/20/2007  .  PROSTATE CANCER, HX OF 06/20/2007    Past Surgical History:  Procedure Laterality Date  . APPENDECTOMY    . broken knee cap    . broken wrist    . COLONOSCOPY  2005  . CORONARY STENT PLACEMENT     2004  . FEMUR IM NAIL Right 12/13/2016   Procedure: INTRAMEDULLARY (IM) NAIL FEMORAL;  Surgeon: Paralee Cancel, MD;  Location: [facility] ORS;  Service: Orthopedics;  Laterality: Right;  . left knee replacement    . LITHOTRIPSY  1996  . PROSTATECTOMY    . radiation for prostate    . RETINAL DETACHMENT SURGERY    . right knee replacement     x 2  .  TOTAL KNEE ARTHROPLASTY  2001/2002       Home Medications    Prior to Admission medications   Medication Sig Start Date End Date Taking? Authorizing Provider  calcium-vitamin D (OSCAL WITH D) 500-200 MG-UNIT tablet Take 1 tablet by mouth 2 (two) times daily.   Yes [provider]  clobetasol (TEMOVATE) 0.05 % external solution APPLY TO AFFECTED AREA TWICE A DAY ON SCALP AS NEEDED (NOT TO FACE) 12/22/15  Yes [provider]  diclofenac sodium (VOLTAREN) 1 % GEL Apply 1 application topically as needed. 02/13/17  Yes [provider]  donepezil (ARICEPT) 10 MG tablet Take 1 tablet (10 mg total) by mouth at bedtime. 04/03/17  Yes Cameron Sprang, MD  HYDROcodone-acetaminophen [facility]) 10-325 MG tablet Take 0.5 tablets by mouth 2 (two) times daily as needed for moderate pain. For pain 07/18/17  Yes Laurey Morale, MD  hydrocortisone 1 % lotion Apply 1 application topically 2 (two) times daily. Apply with the start of chemotherapy. 06/22/17  Yes Wyatt Portela, MD  megestrol (MEGACE) 400 MG/10ML suspension Take 10 mLs (400 mg total) by mouth 2 (two) times daily. 09/04/17  Yes Wyatt Portela, MD  Multiple Vitamins-Minerals (OCUVITE PRESERVISION) TABS Take 1 tablet by mouth 2 (two) times daily.     Yes [provider]  ondansetron (ZOFRAN) 4 MG tablet Take 1 tablet (4 mg total) by mouth every 8 (eight) hours as needed for nausea or vomiting. 09/05/17  Yes Shadad, Mathis Dad, MD  Polyethyl Glycol-Propyl Glycol (SYSTANE OP) Apply 1 drop to eye daily as needed.   Yes [provider]  prochlorperazine (COMPAZINE) 10 MG tablet Take 1 tablet (10 mg total) by mouth every 6 (six) hours as needed for nausea or vomiting. 07/10/17  Yes Wyatt Portela, MD  amoxicillin-clavulanate (AUGMENTIN) 875-125 MG tablet Take 1 tablet by mouth 2 (two) times daily. One po bid x 7 days 09/07/17   Deno Etienne, DO  doxycycline (DORYX) 100 MG EC tablet Take 1 tablet (100 mg total) by mouth  daily. Patient not taking: Reported on 09/07/2017 06/22/17   Wyatt Portela, MD    Family History Family History  Problem Relation Age of Onset  . Hypertension Other   . Arthritis Other   . Cancer Other   . Rheum arthritis Daughter     Social History Social History  Substance Use Topics  . Smoking status: Former Research scientist (life sciences)  . Smokeless tobacco: Never Used     Comment: Stopped in 1986  . Alcohol use No     Allergies   Aspirin; Meperidine hcl; and Pravastatin   Review of Systems Review of Systems  Constitutional: Negative for chills and fever.  HENT: Negative for congestion and facial swelling.   Eyes: Negative for discharge and  visual disturbance.  Respiratory: Negative for shortness of breath.   Cardiovascular: Negative for chest pain and palpitations.  Gastrointestinal: Positive for abdominal pain, blood in stool and constipation. Negative for diarrhea and vomiting.  Musculoskeletal: Negative for arthralgias and myalgias.  Skin: Negative for color change and rash.  Neurological: Negative for tremors, syncope and headaches.  Psychiatric/Behavioral: Negative for confusion and dysphoric mood.     Physical Exam Updated Vital Signs BP (!) 143/75 (BP Location: Right Arm)   Pulse 74   Temp 98.9 F (37.2 C) (Oral)   Resp (!) 25   Ht 5\' 9"  (1.753 m)   Wt 73.9 kg (163 lb)   SpO2 100%   BMI 24.07 kg/m   Physical Exam  Constitutional: He is oriented to person, place, and time. He appears well-developed and well-nourished.  HENT:  Head: Normocephalic and atraumatic.  Eyes: Pupils are equal, round, and reactive to light. EOM are normal.  Neck: Normal range of motion. Neck supple. No JVD present.  Cardiovascular: Normal rate and regular rhythm.  Exam reveals no gallop and no friction rub.   No murmur heard. Pulmonary/Chest: No respiratory distress. He has no wheezes.  Abdominal: He exhibits no distension and no mass. There is no tenderness. There is no rebound and no  guarding.  Genitourinary:  Genitourinary Comments: Tender about the rectum. No palpable defects. No gross blood. Patient does have a  jelly appearing substance that is a pinkish hue. No gross blood, no stool.   Musculoskeletal: Normal range of motion.  Neurological: He is alert and oriented to person, place, and time.  Skin: No rash noted. No pallor.  Psychiatric: He has a normal mood and affect. His behavior is normal.  Nursing note and vitals reviewed.    ED Treatments / Results  Labs (all labs ordered are listed, but only abnormal results are displayed) Labs Reviewed  CBC WITH DIFFERENTIAL/PLATELET - Abnormal; Notable for the following:       Result Value   WBC 12.5 (*)    Neutro Abs 11.4 (*)    All other components within normal limits  COMPREHENSIVE METABOLIC PANEL - Abnormal; Notable for the following:    Glucose, Bld 224 (*)    BUN 21 (*)    Total Protein 6.4 (*)    Albumin 3.3 (*)    All other components within normal limits  I-STAT CHEM 8, ED - Abnormal; Notable for the following:    Glucose, Bld 217 (*)    Calcium, Ion 1.11 (*)    All other components within normal limits  POC OCCULT BLOOD, ED - Abnormal; Notable for the following:    Fecal Occult Bld POSITIVE (*)    All other components within normal limits    EKG  EKG Interpretation  Date/Time:  Thursday September 07 2017 16:20:26 EDT Ventricular Rate:  78 PR Interval:    QRS Duration: 92 QT Interval:  413 QTC Calculation: 471 R Axis:   11 Text Interpretation:  Sinus or ectopic atrial rhythm Abnormal R-wave progression, early transition Otherwise no significant change Confirmed by Deno Etienne [phone]) on 09/07/2017 4:40:06 PM       Radiology Ct Abdomen Pelvis W Contrast  Result Date: 09/07/2017 CLINICAL DATA:  Currently, patient is being treated for lung cancer with chemotherapy. Last chemotherapy was two weeks ago. Patient has been constipated. Patients spouse performed a FLEETS enema and patient performed  a personal digital disimpaction. Patient had a large bowel movement with bright red blood and mucous at the  end. Also, during having the bowel movement, patient had weakness and near syncopal feeling. EXAM: CT ABDOMEN AND PELVIS WITH CONTRAST TECHNIQUE: Multidetector CT imaging of the abdomen and pelvis was performed using the standard protocol following bolus administration of intravenous contrast. CONTRAST:  100 mL of Isovue-300 intravenous contrast. COMPARISON:  PET-CT, 06/16/2017.  Abdomen and pelvis CT, 11/24/2016. FINDINGS: Lower chest: No acute abnormality. Hepatobiliary: Liver is unremarkable. Gallbladder contains dependent material consistent with stones. No wall thickening or inflammation. No bile duct dilation. Pancreas: Unremarkable. No pancreatic ductal dilatation or surrounding inflammatory changes. Spleen: Normal in size without focal abnormality. Adrenals/Urinary Tract: 14 mm left adrenal mass. It shows relative washout of chest under 40%. However, on the prior study it was of low-attenuation, -6 Hounsfield units. This is consistent an adenoma. Normal right adrenal gland. 11 mm left lower pole renal cyst. No other renal masses, no stones and no hydronephrosis. Normal ureters. Bladder is unremarkable. Stomach/Bowel: The colon is mostly decompressed, particularly the left colon. The wall of the lower descending and sigmoid colon extending to the rectum appears thickened. There is subtle adjacent hazy opacity in the mesenteries. No other colonic wall thickening or adjacent inflammation. Stomach and small bowel are unremarkable. Vascular/Lymphatic: Aortic atherosclerosis. No enlarged abdominal or pelvic lymph nodes. Reproductive: Findings consistent with a previous prostatectomy. Other: No hernia.  No ascites. Musculoskeletal: Status post right proximal femur fracture reduction with an intramedullary rod and compression screw. Orthopedic hardware appears well seated. Spot compression fractures of T10,  T11 and T12 stable from the prior abdomen pelvis CT. No acute fractures. No osteoblastic or osteolytic lesions. IMPRESSION: 1. There is wall thickening and subtle inflammation involving the lower descending colon, entire sigmoid colon and rectum consistent with an inflammatory or infectious colitis. No abscess or extraluminal air. 2. No other acute abnormalities within the abdomen or pelvis. 3. Gallstones. 4. Stable small left adrenal adenoma. 5. Aortic atherosclerosis. Electronically Signed   By: Lajean Manes M.D.   On: 09/07/2017 19:23    Procedures Procedures (including critical care time)  Medications Ordered in ED Medications  sodium chloride 0.9 % bolus 1,000 mL (0 mLs Intravenous Stopped 09/07/17 1902)  iopamidol (ISOVUE-300) 61 % injection (100 mLs Intravenous Contrast Given 09/07/17 1847)     Initial Impression / Assessment and Plan / ED Course  I have reviewed the triage vital signs and the nursing notes.  Pertinent labs & imaging results that were available during my care of the patient were reviewed by me and considered in my medical decision making (see chart for details).     83 yo M with a chief complaint of bright red blood mixed in stool. This is likely due to an injury from his disimpaction. Will eval  for neutropenia prior to rectal exam.  Family is now talking about the patient having issues eating and chronic abdominal pain for the past couple weeks. Will obtain a CT scan.  CT scan with possible infection of the colon. Will start on Augmentin. Follow-up with oncology and given GI follow-up with length of symptoms.  Final Clinical Impressions(s) / ED Diagnoses   Final diagnoses:  Colitis    New Prescriptions New Prescriptions   AMOXICILLIN-CLAVULANATE (AUGMENTIN) 875-125 MG TABLET    Take 1 tablet by mouth 2 (two) times daily. One po bid x 7 days     Deno Etienne, DO 09/07/17 2000

## 2017-09-07 NOTE — ED Triage Notes (Signed)
Patient is from home and accompanied by his spouse. Currently, patient is being treated for lung cancer with chemotherapy. Last chemotherapy was two weeks ago. Patient has been constipated. Patients spouse performed a FLEETS enema and patient performed a personal digital disimpaction. Patient had a large bowel movement with bright red blood and mucous at the end. Also, during having the bowel movement, patient had weakness and near syncopal feeling.

## 2017-09-12 ENCOUNTER — Ambulatory Visit (HOSPITAL_COMMUNITY)
Admission: RE | Admit: 2017-09-12 | Discharge: 2017-09-12 | Disposition: A | Discharge status: Home / Self Care | Payer: Medicare Other | Source: Ambulatory Visit | Attending: Oncology | Admitting: Oncology

## 2017-09-12 ENCOUNTER — Encounter (HOSPITAL_COMMUNITY): Payer: Self-pay

## 2017-09-12 DIAGNOSIS — J984 Other disorders of lung: Secondary | ICD-10-CM | POA: Insufficient documentation

## 2017-09-12 DIAGNOSIS — R590 Localized enlarged lymph nodes: Secondary | ICD-10-CM | POA: Diagnosis not present

## 2017-09-12 DIAGNOSIS — I700 Atherosclerosis of aorta: Secondary | ICD-10-CM | POA: Diagnosis not present

## 2017-09-12 DIAGNOSIS — I2510 Atherosclerotic heart disease of native coronary artery without angina pectoris: Secondary | ICD-10-CM | POA: Insufficient documentation

## 2017-09-12 DIAGNOSIS — C3490 Malignant neoplasm of unspecified part of unspecified bronchus or lung: Secondary | ICD-10-CM | POA: Diagnosis present

## 2017-09-12 MED ORDER — IOPAMIDOL (ISOVUE-300) INJECTION 61%
INTRAVENOUS | Status: AC
  Administered 2017-09-12: 75 mL via INTRAVENOUS
  Filled 2017-09-12: qty 75

## 2017-09-12 MED ORDER — IOPAMIDOL (ISOVUE-300) INJECTION 61%
75.0000 mL | Freq: Once | INTRAVENOUS | Status: AC | PRN
  Administered 2017-09-12: 75 mL via INTRAVENOUS

## 2017-09-14 ENCOUNTER — Telehealth: Payer: Self-pay | Admitting: Oncology

## 2017-09-14 ENCOUNTER — Other Ambulatory Visit (HOSPITAL_BASED_OUTPATIENT_CLINIC_OR_DEPARTMENT_OTHER): Payer: Medicare Other

## 2017-09-14 ENCOUNTER — Other Ambulatory Visit: Payer: Self-pay | Admitting: *Deleted

## 2017-09-14 ENCOUNTER — Ambulatory Visit: Payer: Medicare Other

## 2017-09-14 ENCOUNTER — Ambulatory Visit (HOSPITAL_BASED_OUTPATIENT_CLINIC_OR_DEPARTMENT_OTHER): Payer: Medicare Other | Admitting: Oncology

## 2017-09-14 VITALS — BP 138/57 | HR 75 | Temp 97.9°F | Resp 18

## 2017-09-14 DIAGNOSIS — C773 Secondary and unspecified malignant neoplasm of axilla and upper limb lymph nodes: Secondary | ICD-10-CM

## 2017-09-14 DIAGNOSIS — K529 Noninfective gastroenteritis and colitis, unspecified: Secondary | ICD-10-CM | POA: Diagnosis not present

## 2017-09-14 DIAGNOSIS — C4442 Squamous cell carcinoma of skin of scalp and neck: Secondary | ICD-10-CM

## 2017-09-14 DIAGNOSIS — R630 Anorexia: Secondary | ICD-10-CM | POA: Diagnosis not present

## 2017-09-14 DIAGNOSIS — C7800 Secondary malignant neoplasm of unspecified lung: Secondary | ICD-10-CM

## 2017-09-14 DIAGNOSIS — R531 Weakness: Secondary | ICD-10-CM | POA: Diagnosis not present

## 2017-09-14 DIAGNOSIS — R627 Adult failure to thrive: Secondary | ICD-10-CM | POA: Diagnosis not present

## 2017-09-14 DIAGNOSIS — Z8546 Personal history of malignant neoplasm of prostate: Secondary | ICD-10-CM

## 2017-09-14 DIAGNOSIS — C4492 Squamous cell carcinoma of skin, unspecified: Secondary | ICD-10-CM

## 2017-09-14 LAB — COMPREHENSIVE METABOLIC PANEL
ALBUMIN: 2.8 g/dL — AB (ref 3.5–5.0)
ALK PHOS: 77 U/L (ref 40–150)
ALT: 23 U/L (ref 0–55)
AST: 16 U/L (ref 5–34)
Anion Gap: 9 mEq/L (ref 3–11)
BUN: 19.3 mg/dL (ref 7.0–26.0)
CHLORIDE: 110 meq/L — AB (ref 98–109)
CO2: 23 meq/L (ref 22–29)
Calcium: 9 mg/dL (ref 8.4–10.4)
Creatinine: 0.9 mg/dL (ref 0.7–1.3)
GLUCOSE: 103 mg/dL (ref 70–140)
POTASSIUM: 4 meq/L (ref 3.5–5.1)
SODIUM: 141 meq/L (ref 136–145)
Total Bilirubin: 0.47 mg/dL (ref 0.20–1.20)
Total Protein: 6.2 g/dL — ABNORMAL LOW (ref 6.4–8.3)

## 2017-09-14 LAB — CBC WITH DIFFERENTIAL/PLATELET
BASO%: 0.4 % (ref 0.0–2.0)
Basophils Absolute: 0 10*3/uL (ref 0.0–0.1)
EOS%: 3.3 % (ref 0.0–7.0)
Eosinophils Absolute: 0.3 10*3/uL (ref 0.0–0.5)
HCT: 37.6 % — ABNORMAL LOW (ref 38.4–49.9)
HEMOGLOBIN: 12.5 g/dL — AB (ref 13.0–17.1)
LYMPH%: 11.2 % — ABNORMAL LOW (ref 14.0–49.0)
MCH: 28.9 pg (ref 27.2–33.4)
MCHC: 33.2 g/dL (ref 32.0–36.0)
MCV: 87 fL (ref 79.3–98.0)
MONO#: 0.7 10*3/uL (ref 0.1–0.9)
MONO%: 9.2 % (ref 0.0–14.0)
NEUT#: 5.9 10*3/uL (ref 1.5–6.5)
NEUT%: 75.9 % — AB (ref 39.0–75.0)
Platelets: 250 10*3/uL (ref 140–400)
RBC: 4.32 10*6/uL (ref 4.20–5.82)
RDW: 13.6 % (ref 11.0–14.6)
WBC: 7.8 10*3/uL (ref 4.0–10.3)
lymph#: 0.9 10*3/uL (ref 0.9–3.3)

## 2017-09-14 LAB — MAGNESIUM: Magnesium: 1.8 mg/dl (ref 1.5–2.5)

## 2017-09-14 MED ORDER — ONDANSETRON HCL 4 MG PO TABS: 4.0000 mg | ORAL_TABLET | Freq: Three times a day (TID) | ORAL | 1 refills | Status: AC | PRN

## 2017-09-14 NOTE — Progress Notes (Signed)
Hematology and Oncology Follow Up Visit  [name] [id number] 02/04/1930 83 y.o. 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 He presented with a left axillary mass with biopsy proven to be squamous cell carcinoma likely metastatic from a scalp lesion. PET CT scan in March 2018 showed no distant metastasis. Repeat PET/CT scan in July 2018 showed pulmonary metastasis.   Prior Therapy:  He is status post excisional biopsy done on 12/08/2016 of his left axillary mass which showed squamous cell carcinoma. He is status post radiation therapy to the left chest and axilla completed in June 2018. Vectibix 6 mg/kg cycle 1 given on 07/20/2017. He is status post 3 cycles.  Current therapy: Observation and surveillance.  Interim History: [name] presents today for a follow-up visit with his family. Since the last visit, he was diagnosed with colitis and was seen in the emergency department on 09/07/2017. He has prescribed antibiotics with improvement in his symptoms. He is taking Megace which have helped with his appetite although his weight is down. His overall health is declining including decline in his performance status and quality of life. He is experiencing worsening dementia and memory issues. He currently living in the independent part of a senior living facility. He does report periodic pain that is chronic in nature. He denied any respiratory complaints including cough or hemoptysis.  He does not report any headaches, blurry vision, syncope or seizures. He is not reporting any fevers, chills or sweats. He does not report any cough, wheezing or hemoptysis. He does not report any nausea, vomiting or abdominal pain. He is not reporting frequency urgency or hesitancy. He does not report any skeletal complaints of arthralgias or myalgias. Remaining review  of systems unremarkable.   Medications: I have reviewed the patient's current medications.  Current Outpatient Prescriptions  Medication Sig Dispense Refill  . amoxicillin-clavulanate (AUGMENTIN) 875-125 MG tablet Take 1 tablet by mouth 2 (two) times daily. One po bid x 7 days 14 tablet 0  . calcium-vitamin D (OSCAL WITH D) 500-200 MG-UNIT tablet Take 1 tablet by mouth 2 (two) times daily.    . clobetasol (TEMOVATE) 0.05 % external solution APPLY TO AFFECTED AREA TWICE A DAY ON SCALP AS NEEDED (NOT TO FACE)  2  . diclofenac sodium (VOLTAREN) 1 % GEL Apply 1 application topically as needed.  0  . donepezil (ARICEPT) 10 MG tablet Take 1 tablet (10 mg total) by mouth at bedtime. 90 tablet 3  . doxycycline (DORYX) 100 MG EC tablet Take 1 tablet (100 mg total) by mouth daily. 60 tablet 3  . HYDROcodone-acetaminophen (NORCO) 10-325 MG tablet Take 0.5 tablets by mouth 2 (two) times daily as needed for moderate pain. For pain 30 tablet 0  . hydrocortisone 1 % lotion Apply 1 application topically 2 (two) times daily. Apply with the start of chemotherapy. 118 mL 0  . megestrol (MEGACE) 400 MG/10ML suspension Take 10 mLs (400 mg total) by mouth 2 (two) times daily. 240 mL 0  . Multiple Vitamins-Minerals (OCUVITE PRESERVISION) TABS Take 1 tablet by mouth 2 (two) times daily.      . ondansetron (ZOFRAN) 4 MG tablet Take 1 tablet (4 mg total) by mouth every 8 (eight) hours as needed for nausea or vomiting. 20 tablet 1  . Polyethyl Glycol-Propyl Glycol (SYSTANE OP) Apply 1 drop to eye daily as needed.    Marland Kitchen  prochlorperazine (COMPAZINE) 10 MG tablet Take 1 tablet (10 mg total) by mouth every 6 (six) hours as needed for nausea or vomiting. 30 tablet 0   No current facility-administered medications for this visit.      Allergies:  Allergies  Allergen Reactions  . Aspirin Other (See Comments)    Daily use causes bleeding in the bladder.  Can take occasionally  . Meperidine Hcl Other (See Comments)     REACTION: combative  . Pravastatin Other (See Comments)    ? Memory deficits improved when went off med    Past Medical History, Surgical history, Social history, and Family History were reviewed and updated.  Physical Exam: Blood pressure (!) 138/57, pulse 75, temperature 97.9 F (36.6 C), temperature source Oral, resp. rate 18, SpO2 100 %. ECOG: 1 General appearance: Elderly frail gentleman without distress. Head: Normocephalic, without obvious abnormality no oral ulcers. Neck: no adenopathy no masses or lesions. Lymph nodes: Cervical, supraclavicular, and axillary nodes normal.  Heart:regular rate and rhythm, S1, S2 normal, no murmur, click, rub or gallop Lung:chest clear, no wheezing, rales, normal symmetric air entry. Abdomin: soft, non-tender, without masses or organomegaly without rebound or guarding. EXT:no erythema, induration.  Skin: Acneiform rash appears to be very limited.  Lab Results: Lab Results  Component Value Date   WBC 7.8 09/14/2017   HGB 12.5 (L) 09/14/2017   HCT 37.6 (L) 09/14/2017   MCV 87.0 09/14/2017   PLT 250 09/14/2017     Chemistry      Component Value Date/Time   NA 141 09/14/2017 1132   K 4.0 09/14/2017 1132   CL 101 09/07/2017 1643   CO2 23 09/14/2017 1132   BUN 19.3 09/14/2017 1132   CREATININE 0.9 09/14/2017 1132   GLU 105 12/20/2016      Component Value Date/Time   CALCIUM 9.0 09/14/2017 1132   ALKPHOS 77 09/14/2017 1132   AST 16 09/14/2017 1132   ALT 23 09/14/2017 1132   BILITOT 0.47 09/14/2017 1132     EXAM: CT ABDOMEN AND PELVIS WITH CONTRAST  TECHNIQUE: Multidetector CT imaging of the abdomen and pelvis was performed using the standard protocol following bolus administration of intravenous contrast.  CONTRAST:  100 mL of Isovue-300 intravenous contrast.  COMPARISON:  PET-CT, 06/16/2017.  Abdomen and pelvis CT, 11/24/2016.  FINDINGS: Lower chest: No acute abnormality.  Hepatobiliary: Liver is unremarkable.  Gallbladder contains dependent material consistent with stones. No wall thickening or inflammation. No bile duct dilation.  Pancreas: Unremarkable. No pancreatic ductal dilatation or surrounding inflammatory changes.  Spleen: Normal in size without focal abnormality.  Adrenals/Urinary Tract: 14 mm left adrenal mass. It shows relative washout of chest under 40%. However, on the prior study it was of low-attenuation, -6 Hounsfield units. This is consistent an adenoma. Normal right adrenal gland.  11 mm left lower pole renal cyst. No other renal masses, no stones and no hydronephrosis. Normal ureters. Bladder is unremarkable.  Stomach/Bowel: The colon is mostly decompressed, particularly the left colon. The wall of the lower descending and sigmoid colon extending to the rectum appears thickened. There is subtle adjacent hazy opacity in the mesenteries. No other colonic wall thickening or adjacent inflammation.  Stomach and small bowel are unremarkable.  Vascular/Lymphatic: Aortic atherosclerosis. No enlarged abdominal or pelvic lymph nodes.  Reproductive: Findings consistent with a previous prostatectomy.  Other: No hernia.  No ascites.  Musculoskeletal: Status post right proximal femur fracture reduction with an intramedullary rod and compression screw. Orthopedic hardware appears well seated. Spot  compression fractures of T10, T11 and T12 stable from the prior abdomen pelvis CT. No acute fractures. No osteoblastic or osteolytic lesions.  IMPRESSION: 1. There is wall thickening and subtle inflammation involving the lower descending colon, entire sigmoid colon and rectum consistent with an inflammatory or infectious colitis. No abscess or extraluminal air. 2. No other acute abnormalities within the abdomen or pelvis. 3. Gallstones. 4. Stable small left adrenal adenoma. 5. Aortic atherosclerosis.  EXAM: CT CHEST WITH CONTRAST  TECHNIQUE: Multidetector CT  imaging of the chest was performed during intravenous contrast administration.  CONTRAST:  75 cc of Isovue-300  COMPARISON:  06/01/2017  FINDINGS: Cardiovascular: The heart size is normal. Aortic atherosclerosis noted. Calcification within the LAD coronary artery identified. No pericardial effusion.  Mediastinum/Nodes: Right paratracheal adenopathy has progressed from previous exam. Measures 3.8 by 5.2 cm, image 56 of series 2. This now extends into the lumen of the trachea at the level of the carina, image number 56 of series 2. On the previous exam this measure 2.4 x 3.4 cm. Right hilar lymph node has a short axis of 2.7 cm, image 67 of series 2. Previously 1.1 cm. Right infrahilar node measures 1.5 cm, image 79 of series 2. Previously 1.3 cm. Left axillary node measures 1.4 cm, image 49 of series 2. Stable from previous exam.  Lungs/Pleura: The index lesion within the posterior right upper lobe measures 2.9 cm, image 51 of series 5. Previously 1.2 cm. Calcified granuloma identified within the right upper lobe posteriorly.  Upper Abdomen: No acute abnormality.  Musculoskeletal: The bones appear osteopenic. Compression deformity involving T5 and T11 are unchanged from previous exam. No aggressive lytic or sclerotic bone lesions.  IMPRESSION: 1. There has been interval progression of disease. Significant increase in size of right paratracheal adenopathy which now appears to invade the trachea. 2. Progression of right hilar adenopathy. 3. Right upper lobe index lesion has increased in size in the interval. 4. Aortic Atherosclerosis (ICD10-I70.0). Lad coronary artery calcifications.   Impression and Plan:   83 year old gentleman with the following issues:  1. 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 He presented with a palpated left axillary mass that has been removed surgically on 12/08/2016. The pathology confirmed the presence of metastatic  squamous cell carcinoma. 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  PET CT scan obtained on 02/27/2017 showed no evidence of widespread metastasis at that time.  He is status post radiation therapy to the left chest wall completed in June 2018.  PET CT scan obtained on 06/16/2017 showed metastatic squamous cell carcinoma arising from the skin with lymphadenopathy and lung nodules.  He is S/P Vectibix that was tolerated Poorly. .  CT scan obtained on 09/12/2017 was discussed today with the patient and his family. He also had a CT scan the abdomen and pelvis was also reviewed today. She appears to have developed progression of disease with worsening pulmonary nodules. He has also worsening thoracic lymphadenopathy.  Options of treatment were reviewed today including different salvage therapy versus supportive care. Given his overall poor health, dementia and progression of disease I have recommended supportive care only. I anticipate continuous decline in his overall health and will likely require hospice in the near future.  2. Prognosis: Poor with limited life expectancy.  3. Prostate cancer: He is status post a definitive radiation therapy without any evidence of recurrent disease. He  follows with Dr. Jeffie Pollock in his most recent PSA have been in October 2017 nearly undetectable  4. Anorexia, failure to thrive and weakness: He is recovering from colitis which have exacerbated his symptoms. He continues to take Megace and antiemetics as needed.  5. Follow-up: In 3 months to follow his progress. He will also be referred to hospice once he is ready and his family agrees to proceed with that.    Zola Button, MD 10/11/201812:32 PM

## 2017-09-14 NOTE — Telephone Encounter (Signed)
Gave patient avs and calendar with appts per 10/11 los

## 2017-09-15 ENCOUNTER — Telehealth: Payer: Self-pay | Admitting: *Deleted

## 2017-09-15 ENCOUNTER — Other Ambulatory Visit: Payer: Self-pay | Admitting: *Deleted

## 2017-09-15 MED ORDER — POLYETHYLENE GLYCOL 3350 17 G PO PACK: 17.0000 g | PACK | Freq: Every day | ORAL | 1 refills | Status: AC

## 2017-09-15 NOTE — Telephone Encounter (Signed)
Referral given to palliative care per family's request.

## 2017-09-15 NOTE — Telephone Encounter (Signed)
Spoke with Jeanett Schlein -requesting  palliative care , teeth cleaning, and constipation meds.  Ok per Dr Alen Blew.  We will make referral for palliative care. Miralax to be called in to [facility].

## 2017-09-15 NOTE — Telephone Encounter (Signed)
Spoke with Amy at [facility] for palliative referral  per family's request.

## 2017-09-25 ENCOUNTER — Telehealth: Payer: Self-pay

## 2017-09-25 NOTE — Telephone Encounter (Signed)
Pt wife stated pt developed a cough last 3-4 days. It is a heavy cough, not spitting up anything, the family also has colds. No fever. She was asking if he can take mucinex. This RN said OK to use mucinex, to make sure he is drinking plenty of fluids. If he gets worse to call back.

## 2017-09-27 ENCOUNTER — Telehealth: Payer: Self-pay | Admitting: *Deleted

## 2017-09-27 ENCOUNTER — Telehealth: Payer: Self-pay

## 2017-09-27 NOTE — Telephone Encounter (Addendum)
Wynell Balloon NP with palliative called. She is suggesting pt transition to hospice. Wife is asking if he should keep his MRI appt on Friday and Rodena Piety told her several times how is he going to get there. He has been in bed for last 3 days, he has been incontinent. He is whispering b/c he is so weak. Rodena Piety feels he needs the increased support of hospice.  VO for hospice given.

## 2017-09-27 NOTE — Telephone Encounter (Signed)
rec'd calls from Tril[place] NP and yvette @ [facility], requesting order for hospice. Per dr Alen Blew, he will be the attending, hospice MD's may do symptom management and have DNR signed.

## 2017-09-27 NOTE — Telephone Encounter (Signed)
10/22 wife called that pt has a heavy cough, instructed to use mucinex at that time. Late yesterday eve and about 0300 coughed up bright red blood, small amount in kleenex, this am coughed up lighter smaller amount blood, fingernail sized.  Cough drops help some, mucinex not really helping, not using robitussin. Hearing pt in the background with hard cough.  No fever 98.7. Has post nasal.  Has MRI Friday for his eyes.   Called palliative and s/w Inez Catalina. She stated she would call pt.

## 2017-09-27 NOTE — Telephone Encounter (Signed)
I agree with hospice. MRI can be canceled.

## 2017-09-29 ENCOUNTER — Ambulatory Visit (HOSPITAL_COMMUNITY): Admission: RE | Admit: 2017-09-29 | Payer: Medicare Other | Source: Ambulatory Visit

## 2017-10-03 ENCOUNTER — Ambulatory Visit: Payer: Medicare Other | Admitting: Neurology

## 2017-10-07 ENCOUNTER — Inpatient Hospital Stay (HOSPITAL_COMMUNITY)
Admission: EM | Admit: 2017-10-07 | Discharge: 2017-10-07 | DRG: 192 | Disposition: A | Discharge status: Hospice | Attending: Internal Medicine | Admitting: Internal Medicine

## 2017-10-07 ENCOUNTER — Emergency Department (HOSPITAL_COMMUNITY)

## 2017-10-07 DIAGNOSIS — J441 Chronic obstructive pulmonary disease with (acute) exacerbation: Secondary | ICD-10-CM | POA: Diagnosis not present

## 2017-10-07 DIAGNOSIS — F0150 Vascular dementia without behavioral disturbance: Secondary | ICD-10-CM | POA: Diagnosis not present

## 2017-10-07 DIAGNOSIS — C799 Secondary malignant neoplasm of unspecified site: Secondary | ICD-10-CM

## 2017-10-07 DIAGNOSIS — J9602 Acute respiratory failure with hypercapnia: Secondary | ICD-10-CM

## 2017-10-07 DIAGNOSIS — R627 Adult failure to thrive: Secondary | ICD-10-CM | POA: Diagnosis not present

## 2017-10-07 DIAGNOSIS — J9601 Acute respiratory failure with hypoxia: Secondary | ICD-10-CM

## 2017-10-07 DIAGNOSIS — I2510 Atherosclerotic heart disease of native coronary artery without angina pectoris: Secondary | ICD-10-CM | POA: Diagnosis present

## 2017-10-07 DIAGNOSIS — J449 Chronic obstructive pulmonary disease, unspecified: Principal | ICD-10-CM | POA: Diagnosis present

## 2017-10-07 DIAGNOSIS — R0602 Shortness of breath: Secondary | ICD-10-CM | POA: Diagnosis present

## 2017-10-07 DIAGNOSIS — Z7189 Other specified counseling: Secondary | ICD-10-CM

## 2017-10-07 DIAGNOSIS — Z515 Encounter for palliative care: Secondary | ICD-10-CM | POA: Diagnosis not present

## 2017-10-07 DIAGNOSIS — Z955 Presence of coronary angioplasty implant and graft: Secondary | ICD-10-CM | POA: Diagnosis not present

## 2017-10-07 DIAGNOSIS — F0390 Unspecified dementia without behavioral disturbance: Secondary | ICD-10-CM | POA: Diagnosis present

## 2017-10-07 DIAGNOSIS — R0603 Acute respiratory distress: Secondary | ICD-10-CM | POA: Diagnosis present

## 2017-10-07 LAB — COMPREHENSIVE METABOLIC PANEL
ALT: 17 U/L (ref 17–63)
ANION GAP: 13 (ref 5–15)
AST: 21 U/L (ref 15–41)
Albumin: 3.1 g/dL — ABNORMAL LOW (ref 3.5–5.0)
Alkaline Phosphatase: 78 U/L (ref 38–126)
BILIRUBIN TOTAL: 0.7 mg/dL (ref 0.3–1.2)
BUN: 19 mg/dL (ref 6–20)
CHLORIDE: 105 mmol/L (ref 101–111)
CO2: 22 mmol/L (ref 22–32)
Calcium: 9 mg/dL (ref 8.9–10.3)
Creatinine, Ser: 1.15 mg/dL (ref 0.61–1.24)
GFR, EST NON AFRICAN AMERICAN: 56 mL/min — AB (ref >60)
Glucose, Bld: 221 mg/dL — ABNORMAL HIGH (ref 65–99)
POTASSIUM: 3.4 mmol/L — AB (ref 3.5–5.1)
Sodium: 140 mmol/L (ref 135–145)
TOTAL PROTEIN: 7.1 g/dL (ref 6.5–8.1)

## 2017-10-07 LAB — I-STAT CG4 LACTIC ACID, ED
LACTIC ACID, VENOUS: 3.39 mmol/L — AB (ref 0.5–1.9)
Lactic Acid, Venous: 2.42 mmol/L (ref 0.5–1.9)

## 2017-10-07 LAB — CBC WITH DIFFERENTIAL/PLATELET
Basophils Absolute: 0.1 10*3/uL (ref 0.0–0.1)
Basophils Relative: 0 %
EOS PCT: 4 %
Eosinophils Absolute: 0.9 10*3/uL — ABNORMAL HIGH (ref 0.0–0.7)
HEMATOCRIT: 39.1 % (ref 39.0–52.0)
Hemoglobin: 12.9 g/dL — ABNORMAL LOW (ref 13.0–17.0)
LYMPHS PCT: 21 %
Lymphs Abs: 4.9 10*3/uL — ABNORMAL HIGH (ref 0.7–4.0)
MCH: 29.5 pg (ref 26.0–34.0)
MCHC: 33 g/dL (ref 30.0–36.0)
MCV: 89.5 fL (ref 78.0–100.0)
MONO ABS: 1.6 10*3/uL — AB (ref 0.1–1.0)
MONOS PCT: 7 %
NEUTROS ABS: 16.4 10*3/uL — AB (ref 1.7–7.7)
Neutrophils Relative %: 68 %
PLATELETS: 387 10*3/uL (ref 150–400)
RBC: 4.37 MIL/uL (ref 4.22–5.81)
RDW: 13.5 % (ref 11.5–15.5)
WBC: 23.8 10*3/uL — ABNORMAL HIGH (ref 4.0–10.5)

## 2017-10-07 LAB — BLOOD GAS, VENOUS
Acid-base deficit: 5.2 mmol/L — ABNORMAL HIGH (ref 0.0–2.0)
Bicarbonate: 22.8 mmol/L (ref 20.0–28.0)
Delivery systems: POSITIVE
FIO2: 40
O2 Saturation: 30 %
PCO2 VEN: 58.6 mmHg (ref 44.0–60.0)
PH VEN: 7.215 — AB (ref 7.250–7.430)
Patient temperature: 98.6

## 2017-10-07 LAB — I-STAT CHEM 8, ED
BUN: 19 mg/dL (ref 6–20)
CHLORIDE: 107 mmol/L (ref 101–111)
Calcium, Ion: 1.19 mmol/L (ref 1.15–1.40)
Creatinine, Ser: 1.1 mg/dL (ref 0.61–1.24)
GLUCOSE: 232 mg/dL — AB (ref 65–99)
HCT: 39 % (ref 39.0–52.0)
Hemoglobin: 13.3 g/dL (ref 13.0–17.0)
POTASSIUM: 3.5 mmol/L (ref 3.5–5.1)
Sodium: 141 mmol/L (ref 135–145)
TCO2: 23 mmol/L (ref 22–32)

## 2017-10-07 LAB — I-STAT TROPONIN, ED: Troponin i, poc: 0.08 ng/mL (ref 0.00–0.08)

## 2017-10-07 LAB — BRAIN NATRIURETIC PEPTIDE: B NATRIURETIC PEPTIDE 5: 141.6 pg/mL — AB (ref 0.0–100.0)

## 2017-10-07 MED ORDER — MORPHINE SULFATE (PF) 2 MG/ML IV SOLN: 2.0000 mg | INTRAVENOUS | Status: DC | PRN

## 2017-10-07 MED ORDER — ALBUTEROL (5 MG/ML) CONTINUOUS INHALATION SOLN
15.0000 mg/h | INHALATION_SOLUTION | Freq: Once | RESPIRATORY_TRACT | Status: AC
  Administered 2017-10-07: 15 mg/h via RESPIRATORY_TRACT
  Filled 2017-10-07: qty 20

## 2017-10-07 MED ORDER — METHYLPREDNISOLONE SODIUM SUCC 125 MG IJ SOLR
60.0000 mg | Freq: Three times a day (TID) | INTRAMUSCULAR | Status: DC
  Administered 2017-10-07: 60 mg via INTRAVENOUS
  Filled 2017-10-07: qty 2

## 2017-10-07 MED ORDER — LORAZEPAM 2 MG/ML IJ SOLN
1.0000 mg | Freq: Once | INTRAMUSCULAR | Status: AC
  Administered 2017-10-07: 1 mg via INTRAVENOUS
  Filled 2017-10-07: qty 1

## 2017-10-07 MED ORDER — MORPHINE SULFATE (PF) 2 MG/ML IV SOLN
2.0000 mg | INTRAVENOUS | Status: DC | PRN
  Administered 2017-10-07: 2 mg via INTRAVENOUS
  Filled 2017-10-07: qty 1

## 2017-10-07 MED ORDER — MAGNESIUM SULFATE 2 GM/50ML IV SOLN
2.0000 g | Freq: Once | INTRAVENOUS | Status: AC
  Administered 2017-10-07: 2 g via INTRAVENOUS
  Filled 2017-10-07: qty 50

## 2017-10-07 MED ORDER — DEXTROSE 5 % IV SOLN
1.0000 g | Freq: Once | INTRAVENOUS | Status: AC
  Administered 2017-10-07: 1 g via INTRAVENOUS
  Filled 2017-10-07: qty 10

## 2017-10-07 MED ORDER — DEXTROSE 5 % IV SOLN: 1.0000 g | INTRAVENOUS | Status: DC

## 2017-10-07 MED ORDER — LORAZEPAM 2 MG/ML IJ SOLN
1.0000 mg | INTRAMUSCULAR | Status: DC | PRN
  Administered 2017-10-07 (×2): 1 mg via INTRAVENOUS
  Filled 2017-10-07: qty 1

## 2017-10-07 MED ORDER — MORPHINE SULFATE (PF) 4 MG/ML IV SOLN
INTRAVENOUS | Status: AC
  Filled 2017-10-07: qty 1

## 2017-10-07 MED ORDER — LORAZEPAM 2 MG/ML IJ SOLN: 0.5000 mg | INTRAMUSCULAR | Status: DC | PRN

## 2017-10-07 MED ORDER — SODIUM CHLORIDE 0.9 % IV BOLUS (SEPSIS)
1000.0000 mL | Freq: Once | INTRAVENOUS | Status: AC
  Administered 2017-10-07: 1000 mL via INTRAVENOUS

## 2017-10-07 MED ORDER — IPRATROPIUM-ALBUTEROL 0.5-2.5 (3) MG/3ML IN SOLN
3.0000 mL | Freq: Four times a day (QID) | RESPIRATORY_TRACT | Status: DC
  Administered 2017-10-07: 3 mL via RESPIRATORY_TRACT
  Filled 2017-10-07: qty 3

## 2017-10-07 MED ORDER — LORAZEPAM 2 MG/ML IJ SOLN
INTRAMUSCULAR | Status: AC
  Filled 2017-10-07: qty 1

## 2017-10-07 MED ORDER — DEXTROSE 5 % IV SOLN
500.0000 mg | Freq: Once | INTRAVENOUS | Status: AC
  Administered 2017-10-07: 500 mg via INTRAVENOUS
  Filled 2017-10-07: qty 500

## 2017-10-07 MED ORDER — POTASSIUM CHLORIDE 10 MEQ/100ML IV SOLN
10.0000 meq | INTRAVENOUS | Status: DC
  Administered 2017-10-07 (×2): 10 meq via INTRAVENOUS
  Filled 2017-10-07 (×2): qty 100

## 2017-10-07 MED ORDER — IPRATROPIUM BROMIDE 0.02 % IN SOLN
1.0000 mg | Freq: Once | RESPIRATORY_TRACT | Status: AC
  Administered 2017-10-07: 1 mg via RESPIRATORY_TRACT
  Filled 2017-10-07: qty 5

## 2017-10-07 MED ORDER — MORPHINE SULFATE (PF) 4 MG/ML IV SOLN
4.0000 mg | Freq: Once | INTRAVENOUS | Status: AC
  Administered 2017-10-07: 4 mg via INTRAVENOUS
  Filled 2017-10-07: qty 1

## 2017-10-07 MED ORDER — DEXTROSE 5 % IV SOLN: 500.0000 mg | INTRAVENOUS | Status: DC

## 2017-10-07 MED ORDER — SODIUM CHLORIDE 0.9 % IV SOLN
INTRAVENOUS | Status: DC
  Administered 2017-10-07: 09:00:00 via INTRAVENOUS

## 2017-10-07 NOTE — Progress Notes (Signed)
1245--[facility] RN Visit  Stopped in to check on family and patient. Manuela Schwartz, bedside RN reported patient had had another spell of agitation. Family expressed that they were considering comfort measures only with transfer to [facility]. Met with family and Dr. Erlinda Hong, concerns and questions answered and wife decided she would like patient transferred to [facility] today.   CSW, Erin notified.   Registration paperwork completed. Dr. Orpah Melter to assume care per family request.  Hospital Liaison faxed discharge summary to [facility] at 1315.  [facility] was called at 1315 for transport with BiPap.  RN please call report to [phone].  Thank you, Margaretmary Eddy, [place] [phone]  [place] are on [facility].

## 2017-10-07 NOTE — ED Notes (Signed)
RN AND MD NOTIFIED OF PATIENT'S LACTIC ACID LEVEL OF 3.39

## 2017-10-07 NOTE — ED Notes (Addendum)
Called and notified ICU about delay and that hospice RN is speaking with family about palliative care vs transfer to the unit

## 2017-10-07 NOTE — Progress Notes (Signed)
0800--[facility] [place]--RN Note  Met with patient, wife and daughter in room with Dr. Erlinda Hong. Brief discussion for focus of care. Wife at this time elects to have some time to see if medications work to get patient through this acute episode.   Will continue to evaluate and follow patient while in hospital.  Please call with hospice related questions or concerns.  Thank you, Margaretmary Eddy, RN, [place] [facility] Liaison [phone]  [place] are on [facility]ION.

## 2017-10-07 NOTE — ED Notes (Signed)
Contacted [facility] per Hospice RN request, to see if there are any palliative car beds on the floor

## 2017-10-07 NOTE — ED Notes (Signed)
Hopsice RN request PRN ativan and morphine to be given prior to transport to [facility]. EMS arrived for transport

## 2017-10-07 NOTE — ED Triage Notes (Signed)
Pt BIB [facility]. Pt was prescribed hydrocodone cough syrup yesterday by hospice provider. He was coughing in his sleep and gurgling according to wife and she woke him up. This is when he had significant SOB and she called EMS. Pt is a hospice pt. Hx of Lung CA he has stopped radiation and chemo treatments. He was initially on 80% NRB 15 L. He has been sating in the mid 90% for EMS on CPAP. EMS gave 2 duo nebs (10 mg albuterol and 1mg  atrovent total) and 125 solumedrol en route. Pt is Alert but has fluctuating LOC but is easily aroused en route according to EMS.

## 2017-10-07 NOTE — ED Notes (Signed)
Pt has a lactic acid is 2.42 EDP Yao made aware

## 2017-10-07 NOTE — Progress Notes (Signed)
Venous gas done with unreportable Pa02 noted.  RBV results to Dr. Darl Householder with no new orders obtained.

## 2017-10-07 NOTE — Discharge Summary (Addendum)
Same Day Admit and Discharge Summary  [name] MVH:[id number] DOB: 04-09-1930  PCP: Burnis Medin, MD  Admit date: 10/07/2017 Discharge date: 10/07/2017  Time spent: >80 mins, more than 50% time spent on coordination of care.   Patient is to discharge to residential hospice, for full comfort measures.  Discharge Diagnoses:  Active Hospital Problems   Diagnosis Date Noted  . Respiratory distress 10/07/2017    Resolved Hospital Problems   Diagnosis Date Noted Date Resolved  No resolved problems to display.    Discharge Condition: stable  Diet recommendation: As tolerated  There were no vitals filed for this visit.  History of present illness:  HPI: [name] is a 83 y.o. male   With history of dementia, coronary artery disease status post stent, COPD, metastatic squamous cell carcinoma, on home hospice. he developed short of breath and cough that has worsened over the last few days.  EMS called and he is found to be  Wheezing and has rhonchi, he is hypoxic with o2 sats 80% on room air.  Solu-Medrol and DuoNeb's given by EMA, he is brought to the ER.  ER course: on presentation he is tachypneic respiration rate 37, sinus sinus tachycardia heart rate 111, blood pressure 170/83. Stat ABG showed pH 7.2 PCO2 58. he is put on BiPAP.  Chest x-ray revealed COPD changes, increased density left lung apex, progression of tumor versus infection.  Basic labs show leukocytosis WBC 23.8, creatinine 1.1, lactic acid 2.4.  He is given additional nebulizer, started on Rocephin and Zithromax . hospitalist called to admit the patient.  Patient is admitted, plan to stepdown unit to continue COPD treatment and BiPAP.  While waiting in the ED to be transferred to stepdown unit, family decided do not want patient to suffer anymore, they want start full comfort measures and transfer patient to residential hospice.  I presented at the family meeting with hospice liaison.  Patient is started on  full comfort measures transfer to [facility].  Hospice liaison assistant greatly appreciated.  Hospital Course:  Active Problems:   Respiratory distress   Procedures:  bipap  Consultations:  hospice  Discharge Exam: BP (!) 110/57   Pulse 99   Temp (!) 97.1 F (36.2 C) (Rectal)   Resp 17   SpO2 98%   General: open eyes, not follow commands Cardiovascular: tachycardia has improved Respiratory: diminished, previously heard rhonchi has resolved.  Discharge Instructions You were cared for by a hospitalist during your hospital stay. If you have any questions about your discharge medications or the care you received while you were in the hospital after you are discharged, you can call the unit and asked to speak with the hospitalist on call if the hospitalist that took care of you is not available. Once you are discharged, your primary care physician will handle any further medical issues. Please note that NO REFILLS for any discharge medications will be authorized once you are discharged, as it is imperative that you return to your primary care physician (or establish a relationship with a primary care physician if you do not have one) for your aftercare needs so that they can reassess your need for medications and monitor your lab values.   Allergies as of 10/07/2017      Reactions   Aspirin Other (See Comments)   Daily use causes bleeding in the bladder.  Can take occasionally   Meperidine Hcl Other (See Comments)   REACTION: combative   Pravastatin Other (See  Comments)   ? Memory deficits improved when went off med      Medication List    STOP taking these medications   amoxicillin 500 MG capsule Commonly known as:  AMOXIL   amoxicillin-clavulanate 875-125 MG tablet Commonly known as:  AUGMENTIN   benzonatate 100 MG capsule Commonly known as:  TESSALON   donepezil 10 MG tablet Commonly known as:  ARICEPT   HYDROcodone-acetaminophen 10-325 MG tablet Commonly  known as:  NORCO   HYDROcodone-homatropine 5-1.5 MG/5ML syrup Commonly known as:  HYCODAN   hydrocortisone 1 % lotion   megestrol 400 MG/10ML suspension Commonly known as:  MEGACE   OCUVITE PRESERVISION Tabs   ondansetron 4 MG tablet Commonly known as:  ZOFRAN   polyethylene glycol packet Commonly known as:  MIRALAX   prochlorperazine 10 MG tablet Commonly known as:  COMPAZINE      Allergies  Allergen Reactions  . Aspirin Other (See Comments)    Daily use causes bleeding in the bladder.  Can take occasionally  . Meperidine Hcl Other (See Comments)    REACTION: combative  . Pravastatin Other (See Comments)    ? Memory deficits improved when went off med      The results of significant diagnostics from this hospitalization (including imaging, microbiology, ancillary and laboratory) are listed below for reference.    Significant Diagnostic Studies: Ct Chest W Contrast  Result Date: 09/12/2017 CLINICAL DATA:  Metastatic squamous cell carcinoma to the left axilla. EXAM: CT CHEST WITH CONTRAST TECHNIQUE: Multidetector CT imaging of the chest was performed during intravenous contrast administration. CONTRAST:  75 cc of Isovue-300 COMPARISON:  06/01/2017 FINDINGS: Cardiovascular: The heart size is normal. Aortic atherosclerosis noted. Calcification within the LAD coronary artery identified. No pericardial effusion. Mediastinum/Nodes: Right paratracheal adenopathy has progressed from previous exam. Measures 3.8 by 5.2 cm, image 56 of series 2. This now extends into the lumen of the trachea at the level of the carina, image number 56 of series 2. On the previous exam this measure 2.4 x 3.4 cm. Right hilar lymph node has a short axis of 2.7 cm, image 67 of series 2. Previously 1.1 cm. Right infrahilar node measures 1.5 cm, image 79 of series 2. Previously 1.3 cm. Left axillary node measures 1.4 cm, image 49 of series 2. Stable from previous exam. Lungs/Pleura: The index lesion within the  posterior right upper lobe measures 2.9 cm, image 51 of series 5. Previously 1.2 cm. Calcified granuloma identified within the right upper lobe posteriorly. Upper Abdomen: No acute abnormality. Musculoskeletal: The bones appear osteopenic. Compression deformity involving T5 and T11 are unchanged from previous exam. No aggressive lytic or sclerotic bone lesions. IMPRESSION: 1. There has been interval progression of disease. Significant increase in size of right paratracheal adenopathy which now appears to invade the trachea. 2. Progression of right hilar adenopathy. 3. Right upper lobe index lesion has increased in size in the interval. 4. Aortic Atherosclerosis (ICD10-I70.0). Lad coronary artery calcifications. Electronically Signed   By: Kerby Moors M.D.   On: 09/12/2017 16:44   Ct Abdomen Pelvis W Contrast  Result Date: 09/07/2017 CLINICAL DATA:  Currently, patient is being treated for lung cancer with chemotherapy. Last chemotherapy was two weeks ago. Patient has been constipated. Patients spouse performed a FLEETS enema and patient performed a personal digital disimpaction. Patient had a large bowel movement with bright red blood and mucous at the end. Also, during having the bowel movement, patient had weakness and near syncopal feeling. EXAM: CT ABDOMEN  AND PELVIS WITH CONTRAST TECHNIQUE: Multidetector CT imaging of the abdomen and pelvis was performed using the standard protocol following bolus administration of intravenous contrast. CONTRAST:  100 mL of Isovue-300 intravenous contrast. COMPARISON:  PET-CT, 06/16/2017.  Abdomen and pelvis CT, 11/24/2016. FINDINGS: Lower chest: No acute abnormality. Hepatobiliary: Liver is unremarkable. Gallbladder contains dependent material consistent with stones. No wall thickening or inflammation. No bile duct dilation. Pancreas: Unremarkable. No pancreatic ductal dilatation or surrounding inflammatory changes. Spleen: Normal in size without focal abnormality.  Adrenals/Urinary Tract: 14 mm left adrenal mass. It shows relative washout of chest under 40%. However, on the prior study it was of low-attenuation, -6 Hounsfield units. This is consistent an adenoma. Normal right adrenal gland. 11 mm left lower pole renal cyst. No other renal masses, no stones and no hydronephrosis. Normal ureters. Bladder is unremarkable. Stomach/Bowel: The colon is mostly decompressed, particularly the left colon. The wall of the lower descending and sigmoid colon extending to the rectum appears thickened. There is subtle adjacent hazy opacity in the mesenteries. No other colonic wall thickening or adjacent inflammation. Stomach and small bowel are unremarkable. Vascular/Lymphatic: Aortic atherosclerosis. No enlarged abdominal or pelvic lymph nodes. Reproductive: Findings consistent with a previous prostatectomy. Other: No hernia.  No ascites. Musculoskeletal: Status post right proximal femur fracture reduction with an intramedullary rod and compression screw. Orthopedic hardware appears well seated. Spot compression fractures of T10, T11 and T12 stable from the prior abdomen pelvis CT. No acute fractures. No osteoblastic or osteolytic lesions. IMPRESSION: 1. There is wall thickening and subtle inflammation involving the lower descending colon, entire sigmoid colon and rectum consistent with an inflammatory or infectious colitis. No abscess or extraluminal air. 2. No other acute abnormalities within the abdomen or pelvis. 3. Gallstones. 4. Stable small left adrenal adenoma. 5. Aortic atherosclerosis. Electronically Signed   By: Lajean Manes M.D.   On: 09/07/2017 19:23   Dg Chest Port 1 View  Result Date: 10/07/2017 CLINICAL DATA:  Shortness of breath.  History of lung cancer, COPD. EXAM: PORTABLE CHEST 1 VIEW COMPARISON:  Chest CT of September 12, 2017 FINDINGS: Cardiac silhouette is normal in size. Calcified aortic arch. Fullness the hila corresponding known lymphadenopathy. Mild chronic  interstitial changes without pleural effusion or focal consolidation. RIGHT midlung zone granuloma. Focal density LEFT upper chest wall. Surgical clips project in LEFT axilla. IMPRESSION: COPD.  No acute cardiopulmonary process. Increasing density LEFT lung apex, possible overlapping osseous structures though recommend close attention on follow-up imaging. Fullness of the hila corresponding to known lymphadenopathy. Aortic Atherosclerosis (ICD10-I70.0). Electronically Signed   By: Elon Alas M.D.   On: 10/07/2017 06:52    Microbiology: No results found for this or any previous visit (from the past 240 hour(s)).   Labs: Basic Metabolic Panel:  Recent Labs Lab 10/07/17 0612 10/07/17 0621  NA 140 141  K 3.4* 3.5  CL 105 107  CO2 22  --   GLUCOSE 221* 232*  BUN 19 19  CREATININE 1.15 1.10  CALCIUM 9.0  --    Liver Function Tests:  Recent Labs Lab 10/07/17 0612  AST 21  ALT 17  ALKPHOS 78  BILITOT 0.7  PROT 7.1  ALBUMIN 3.1*   No results for input(s): LIPASE, AMYLASE in the last 168 hours. No results for input(s): AMMONIA in the last 168 hours. CBC:  Recent Labs Lab 10/07/17 0612 10/07/17 0621  WBC 23.8*  --   NEUTROABS 16.4*  --   HGB 12.9* 13.3  HCT 39.1 39.0  MCV 89.5  --   PLT 387  --    Cardiac Enzymes: No results for input(s): CKTOTAL, CKMB, CKMBINDEX, TROPONINI in the last 168 hours. BNP: BNP (last 3 results)  Recent Labs  10/07/17 0612  BNP 141.6*    ProBNP (last 3 results) No results for input(s): PROBNP in the last 8760 hours.  CBG: No results for input(s): GLUCAP in the last 168 hours.     SignedFlorencia Reasons MD, PhD  [facility] 10/07/2017, 1:02 PM

## 2017-10-07 NOTE — ED Provider Notes (Signed)
[facility] DEPT Provider Note   CSN: [id number] Arrival date & time: 10/07/17  0559     History   Chief Complaint Chief Complaint  Patient presents with  . Shortness of Breath    HPI [name] is a 83 y.o. male history of CAD, COPD, end-stage lung cancer on hospice here presenting with shortness of breath.  Patient has been short of breath for the last several days.  Patient has been coughing as well and has been trying Mucinex with no relief.  Several days ago, patient was prescribed liquid Vicodin.  Since last night, cough is gotten worse and shortness of breath is worsening.  Patient is on home hospice for end-stage lung cancer is not currently under chemo treatment.  EMS was called since he had trouble breathing and he was noted to be 80% on nonrebreather and very tachypneic has rhonchi.  Patient was given Solu-Medrol, 2 DuoNeb by EMS and was put on CPAP.  The history is provided by the patient and the EMS personnel. The history is limited by the condition of the patient.   Level v caveat- condition of patient   Past Medical History:  Diagnosis Date  . Adenomatous colon polyp   . Anemia   . Arthritis   . CAD (coronary artery disease)    s/p Taxus DES to prox and mid LAD 06/2003;  LHC  1/07: LM 205, LAD stents ok., oD2 70% jailed (tx conservatively), mid to dist RCA 40-50%, EF 40-45%;  echo 2/07: EF 55-65%  . COPD (chronic obstructive pulmonary disease) ([place])   . Dermatitis   . Diverticulosis of colon   . GERD (gastroesophageal reflux disease)   . Hemorrhoids   . Hiatal hernia   . History of prostate cancer   . History of renal calculi   . Hyperglycemia   . Hyperlipidemia   . Hypothyroidism   . Lung cancer ([place])   . Migraines   . Osteoarthritis   . Prostate cancer ([place]) dx'd 1992   xrt/ surg  . Squamous cell skin cancer dx'd 12/2016   left axilla  . Transfusion history     Patient Active Problem Hughes   Diagnosis Date Noted  .  Squamous cell cancer of scalp and skin of neck 07/20/2017  . Hx of fall 07/12/2017  . Hx of fracture of hip 07/12/2017  . Mild dementia 04/11/2017  . Metastatic cancer to axillary lymph nodes ([place]) 02/27/2017  . Closed right hip fracture ([place]) 12/12/2016  . Other fatigue 09/27/2016  . Influenza vaccination declined by patient 09/27/2016  . Anxiety state 10/10/2015  . Abnormal chest x-ray 10/02/2015  . 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 Memory loss 04/13/2015  . Chronically on opiate therapy 12/02/2014  . Goals of care, counseling/discussion 04/30/2014  . Pain management 04/30/2014  . Essential hypertension 09/19/2013  . Medication management 08/19/2012  . Wears hearing aid 02/19/2012  . Macular degeneration 02/19/2012  . Opiate use 02/19/2012  . LESION, FACE 03/17/2010  . SYNCOPE 03/17/2010  . HYPERKERATOSIS 10/06/2009  . OSTEOARTHRITIS 05/22/2009  . HYPOTHYROIDISM 07/02/2008  . ANEMIA 07/02/2008  . FATIGUE 07/02/2008  . TOTAL KNEE REPLACEMENT, RIGHT, HX OF 07/02/2008  . ADENOMATOUS COLONIC POLYP 01/31/2008  . HEMORRHOIDS 01/31/2008  . COPD 01/31/2008  . Diaphragmatic hernia 01/31/2008  . DIVERTICULOSIS OF COLON 01/31/2008  . RENAL CALCULUS, HX OF 01/31/2008  . DERMATITIS 11/06/2007  . ARTHRITIS 11/06/2007  . HYPERLIPIDEMIA 06/20/2007  . Coronary atherosclerosis  06/20/2007  . GERD 06/20/2007  . PROSTATE CANCER, HX OF 06/20/2007    Past Surgical History:  Procedure Laterality Date  . APPENDECTOMY    . broken knee cap    . broken wrist    . COLONOSCOPY  2005  . CORONARY STENT PLACEMENT     2004  . FEMUR IM NAIL Right 12/13/2016   Procedure: INTRAMEDULLARY (IM) NAIL FEMORAL;  Surgeon: Paralee Cancel, MD;  Location: [facility] ORS;  Service: Orthopedics;  Laterality: Right;  . left knee replacement    . LITHOTRIPSY  1996  . PROSTATECTOMY    . radiation for prostate    . RETINAL DETACHMENT SURGERY    . right knee replacement     x 2  .  TOTAL KNEE ARTHROPLASTY  2001/2002       Home Medications    Prior to Admission medications   Medication Sig Start Date End Date Taking? Authorizing Provider  calcium-vitamin D (OSCAL WITH D) 500-200 MG-UNIT tablet Take 1 tablet by mouth 2 (two) times daily.    [provider]  clobetasol (TEMOVATE) 0.05 % external solution APPLY TO AFFECTED AREA TWICE A DAY ON SCALP AS NEEDED (NOT TO FACE) 12/22/15   [provider]  diclofenac sodium (VOLTAREN) 1 % GEL Apply 1 application topically as needed. 02/13/17   [provider]  donepezil (ARICEPT) 10 MG tablet Take 1 tablet (10 mg total) by mouth at bedtime. 04/03/17   Cameron Sprang, MD  HYDROcodone-acetaminophen [facility]) 10-325 MG tablet Take 0.5 tablets by mouth 2 (two) times daily as needed for moderate pain. For pain 07/18/17   Laurey Morale, MD  hydrocortisone 1 % lotion Apply 1 application topically 2 (two) times daily. Apply with the start of chemotherapy. 06/22/17   Wyatt Portela, MD  megestrol (MEGACE) 400 MG/10ML suspension Take 10 mLs (400 mg total) by mouth 2 (two) times daily. 09/04/17   Wyatt Portela, MD  Multiple Vitamins-Minerals (OCUVITE PRESERVISION) TABS Take 1 tablet by mouth 2 (two) times daily.      [provider]  ondansetron (ZOFRAN) 4 MG tablet Take 1 tablet (4 mg total) by mouth every 8 (eight) hours as needed for nausea or vomiting. 09/14/17   Wyatt Portela, MD  Polyethyl Glycol-Propyl Glycol (SYSTANE OP) Apply 1 drop to eye daily as needed.    [provider]  polyethylene glycol (MIRALAX) packet Take 17 g by mouth daily. 09/15/17   Wyatt Portela, MD  prochlorperazine (COMPAZINE) 10 MG tablet Take 1 tablet (10 mg total) by mouth every 6 (six) hours as needed for nausea or vomiting. 07/10/17   Wyatt Portela, MD    Family History Family History  Problem Relation Age of Onset  . Hypertension Other   . Arthritis Other   . Cancer Other   . Rheum arthritis Daughter      Social History Social History  Substance Use Topics  . Smoking status: Former Research scientist (life sciences)  . Smokeless tobacco: Never Used     Comment: Stopped in 1986  . Alcohol use No     Allergies   Aspirin; Meperidine hcl; and Pravastatin   Review of Systems Review of Systems  Respiratory: Positive for cough and shortness of breath.   All other systems reviewed and are negative.    Physical Exam Updated Vital Signs BP (!) 170/83 (BP Location: Left Arm)   Pulse 94   Temp (!) 97.1 F (36.2 C) (Rectal)   Resp (!) 37  SpO2 95%   Physical Exam  Constitutional:  Tachypneic, tripoding, + retractions   HENT:  Head: Normocephalic.  Eyes: Pupils are equal, round, and reactive to light. Conjunctivae and EOM are normal.  Neck: Normal range of motion. Neck supple.  Cardiovascular: Normal rate and regular rhythm.   Pulmonary/Chest:  Tachypneic, + retractions. + rhonchi throughout   Abdominal: Soft. Bowel sounds are normal. He exhibits no distension. There is no tenderness.  Musculoskeletal: Normal range of motion. He exhibits no edema.  Neurological: He is alert.  Skin: Skin is warm.  Psychiatric:  Unable   Nursing note and vitals reviewed.    ED Treatments / Results  Labs (all labs ordered are listed, but only abnormal results are displayed) Labs Reviewed  BLOOD GAS, VENOUS - Abnormal; Notable for the following:       Result Value   pH, Ven 7.215 (*)    Acid-base deficit 5.2 (*)    All other components within normal limits  I-STAT CG4 LACTIC ACID, ED - Abnormal; Notable for the following:    Lactic Acid, Venous 2.42 (*)    All other components within normal limits  I-STAT CHEM 8, ED - Abnormal; Notable for the following:    Glucose, Bld 232 (*)    All other components within normal limits  CULTURE, BLOOD (ROUTINE X 2)  CULTURE, BLOOD (ROUTINE X 2)  CBC WITH DIFFERENTIAL/PLATELET  COMPREHENSIVE METABOLIC PANEL  BRAIN NATRIURETIC PEPTIDE  I-STAT TROPONIN, ED    EKG   EKG Interpretation  Date/Time:  Saturday October 07 2017 06:05:20 EDT Ventricular Rate:  94 PR Interval:    QRS Duration: 86 QT Interval:  357 QTC Calculation: 447 R Axis:   45 Text Interpretation:  Sinus tachycardia Multiform ventricular premature complexes Abnormal R-wave progression, early transition No significant change since last tracing Confirmed by Wandra Arthurs ([id number]) on 10/07/2017 6:41:17 AM       Radiology Dg Chest Port 1 View  Result Date: 10/07/2017 CLINICAL DATA:  Shortness of breath.  History of lung cancer, COPD. EXAM: PORTABLE CHEST 1 VIEW COMPARISON:  Chest CT of September 12, 2017 FINDINGS: Cardiac silhouette is normal in size. Calcified aortic arch. Fullness the hila corresponding known lymphadenopathy. Mild chronic interstitial changes without pleural effusion or focal consolidation. RIGHT midlung zone granuloma. Focal density LEFT upper chest wall. Surgical clips project in LEFT axilla. IMPRESSION: COPD.  No acute cardiopulmonary process. Increasing density LEFT lung apex, possible overlapping osseous structures though recommend close attention on follow-up imaging. Fullness of the hila corresponding to known lymphadenopathy. Aortic Atherosclerosis (ICD10-I70.0). Electronically Signed   By: Elon Alas M.D.   On: 10/07/2017 06:52    Procedures Procedures (including critical care time)  CRITICAL CARE Performed by: Wandra Arthurs   Total critical care time: 45minutes  Critical care time was exclusive of separately billable procedures and treating other patients.  Critical care was necessary to treat or prevent imminent or life-threatening deterioration.  Critical care was time spent personally by me on the following activities: development of treatment plan with patient and/or surrogate as well as nursing, discussions with consultants, evaluation of patient's response to treatment, examination of patient, obtaining history from patient or surrogate, ordering and  performing treatments and interventions, ordering and review of laboratory studies, ordering and review of radiographic studies, pulse oximetry and re-evaluation of patient's condition.   Medications Ordered in ED Medications  magnesium sulfate IVPB 2 g 50 mL (2 g Intravenous Restarted 10/07/17 0641)  cefTRIAXone (ROCEPHIN) 1 g  in dextrose 5 % 50 mL IVPB (1 g Intravenous New Bag/Given 10/07/17 0704)  azithromycin (ZITHROMAX) 500 mg in dextrose 5 % 250 mL IVPB (not administered)  albuterol (PROVENTIL,VENTOLIN) solution continuous neb (15 mg/hr Nebulization Given 10/07/17 0631)  ipratropium (ATROVENT) nebulizer solution 1 mg (1 mg Nebulization Given 10/07/17 8675)  sodium chloride 0.9 % bolus 1,000 mL (1,000 mLs Intravenous New Bag/Given 10/07/17 0711)  LORazepam (ATIVAN) injection 1 mg (1 mg Intravenous Given 10/07/17 0653)     Initial Impression / Assessment and Plan / ED Course  I have reviewed the triage vital signs and the nursing notes.  Pertinent labs & imaging results that were available during my care of the patient were reviewed by me and considered in my medical decision making (see chart for details).     [name] is a 83 y.o. male here with shortness of breath, wheezing. Hx of end stage lung cancer on hospice. Patient has a DNR/DNI that I confirmed with wife. Consider worsening COPD vs pneumonia vs worsening cancer. Will get VBG, labs, sepsis workup, CXR.   7:31 AM Labs showed lactate 2.4. CXR showed possible pneumonia vs worsening mass. Confirmed DNR with wife. Given abx, magnesium. VBG showed pH 7.2. Given that he is DNR, will hold of on intubation. Will admit to stepdown under hospitalist.    Final Clinical Impressions(s) / ED Diagnoses   Final diagnoses:  None    New Prescriptions New Prescriptions   No medications on file     Drenda Freeze, MD 10/07/17 [phone]

## 2017-10-07 NOTE — ED Notes (Addendum)
Pt placed on hospital bed for comfort.

## 2017-10-07 NOTE — ED Notes (Signed)
Pt agitated and trying to get out of bed. Fighting bipap. Dr Darl Householder made aware

## 2017-10-12 LAB — CULTURE, BLOOD (ROUTINE X 2)
Culture: NO GROWTH
Special Requests: ADEQUATE

## 2017-11-04 DEATH — deceased

## 2017-12-06 ENCOUNTER — Ambulatory Visit: Payer: Medicare Other | Admitting: Oncology

## 2019-01-04 ENCOUNTER — Other Ambulatory Visit: Payer: Self-pay | Admitting: Nurse Practitioner

## 2019-05-04 IMAGING — NM NM BONE 3 PHASE
2 series · 12 of 12 positions shown · non-contrast
Comparison: None.

CLINICAL DATA: History of bilateral total knee arthroplasties
several years ago. Right knee pain and limited range motion.

EXAM:
NUCLEAR MEDICINE 3-PHASE BONE SCAN
TECHNIQUE: Radionuclide angiographic images, immediate static blood pool
images, and 3-hour delayed static images were obtained of the
bilateral lower extremities after intravenous injection of
radiopharmaceutical.
RADIOPHARMACEUTICALS:  22.0 MCi Lc-CCm MDP

[Series 1: flow · 4.14mm/px · 6 of 40 frames shown (1 of 2)]
[frame 4/40  full-range]
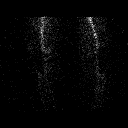
[frame 10/40  full-range]
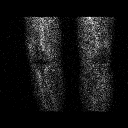
[frame 17/40  full-range]
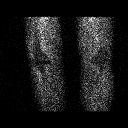
[frame 24/40  full-range]
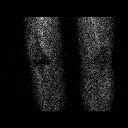
[frame 30/40  full-range]
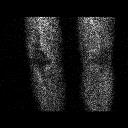
[frame 37/40  full-range]
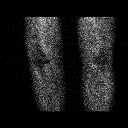

[Series 1: flow · 4.14mm/px · 6 of 40 frames shown (2 of 2)]
[frame 4/40  full-range]
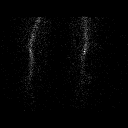
[frame 10/40  full-range]
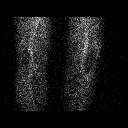
[frame 17/40  full-range]
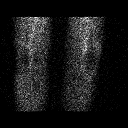
[frame 24/40  full-range]
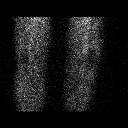
[frame 30/40  full-range]
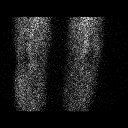
[frame 37/40  full-range]
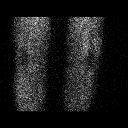

[12 of 12 positions shown; findings below may reference images not displayed]

FINDINGS: Vascular phase: Slight asymmetric flow to the right lower extremity.

Blood pool phase: Asymmetric uptake around the right knee suspicious
for synovitis.

Delayed phase: Fairly symmetric and uniform uptake around both knee
prostheses without findings suggest loosening or stress fracture.
IMPRESSION: Asymmetric uptake around the right knee prosthesis on the blood pool
images suggesting synovitis.

No findings to suggest loosening, infection or stress fracture.
